# Patient Record
Sex: Male | Born: 1940 | Race: Black or African American | Hispanic: No | Marital: Single | Smoking: Current some day smoker
Health system: Southern US, Community
[De-identification: ages and names within clinical notes are randomized; demographics above are authoritative.]

## PROBLEM LIST (undated history)

## (undated) DIAGNOSIS — M549 Dorsalgia, unspecified: Secondary | ICD-10-CM

## (undated) DIAGNOSIS — Z8546 Personal history of malignant neoplasm of prostate: Secondary | ICD-10-CM

## (undated) DIAGNOSIS — I10 Essential (primary) hypertension: Secondary | ICD-10-CM

## (undated) DIAGNOSIS — K219 Gastro-esophageal reflux disease without esophagitis: Secondary | ICD-10-CM

## (undated) DIAGNOSIS — E7800 Pure hypercholesterolemia, unspecified: Secondary | ICD-10-CM

## (undated) DIAGNOSIS — F329 Major depressive disorder, single episode, unspecified: Secondary | ICD-10-CM

## (undated) DIAGNOSIS — J449 Chronic obstructive pulmonary disease, unspecified: Secondary | ICD-10-CM

## (undated) DIAGNOSIS — C801 Malignant (primary) neoplasm, unspecified: Secondary | ICD-10-CM

## (undated) DIAGNOSIS — I517 Cardiomegaly: Secondary | ICD-10-CM

## (undated) DIAGNOSIS — E041 Nontoxic single thyroid nodule: Secondary | ICD-10-CM

## (undated) DIAGNOSIS — Q619 Cystic kidney disease, unspecified: Secondary | ICD-10-CM

## (undated) DIAGNOSIS — F32A Depression, unspecified: Secondary | ICD-10-CM

## (undated) SURGERY — INSERTION, CARDIAC PACEMAKER
Anesthesia: Moderate Sedation | Laterality: Left

---

## 1969-05-25 HISTORY — PX: APPENDECTOMY: SHX54

## 2006-08-01 ENCOUNTER — Emergency Department: Payer: Self-pay | Admitting: Emergency Medicine

## 2006-12-24 ENCOUNTER — Emergency Department: Payer: Self-pay | Admitting: Emergency Medicine

## 2006-12-25 ENCOUNTER — Other Ambulatory Visit: Payer: Self-pay

## 2007-02-22 ENCOUNTER — Ambulatory Visit: Payer: Self-pay | Admitting: Internal Medicine

## 2007-04-06 ENCOUNTER — Other Ambulatory Visit: Payer: Self-pay

## 2007-04-06 ENCOUNTER — Emergency Department: Payer: Self-pay | Admitting: Internal Medicine

## 2007-05-05 ENCOUNTER — Ambulatory Visit: Payer: Self-pay | Admitting: Urology

## 2007-05-09 ENCOUNTER — Ambulatory Visit: Payer: Self-pay | Admitting: Urology

## 2007-05-17 ENCOUNTER — Ambulatory Visit: Payer: Self-pay | Admitting: Radiation Oncology

## 2007-05-26 ENCOUNTER — Ambulatory Visit: Payer: Self-pay | Admitting: Radiation Oncology

## 2007-06-08 ENCOUNTER — Ambulatory Visit: Payer: Self-pay | Admitting: Radiation Oncology

## 2007-06-26 ENCOUNTER — Ambulatory Visit: Payer: Self-pay | Admitting: Radiation Oncology

## 2007-07-24 ENCOUNTER — Ambulatory Visit: Payer: Self-pay | Admitting: Radiation Oncology

## 2007-08-24 ENCOUNTER — Ambulatory Visit: Payer: Self-pay | Admitting: Radiation Oncology

## 2007-09-23 ENCOUNTER — Ambulatory Visit: Payer: Self-pay | Admitting: Radiation Oncology

## 2007-11-23 ENCOUNTER — Ambulatory Visit: Payer: Self-pay | Admitting: Radiation Oncology

## 2007-12-24 ENCOUNTER — Ambulatory Visit: Payer: Self-pay | Admitting: Radiation Oncology

## 2008-07-06 ENCOUNTER — Observation Stay: Payer: Self-pay | Admitting: Internal Medicine

## 2008-08-19 ENCOUNTER — Emergency Department: Payer: Self-pay | Admitting: Emergency Medicine

## 2009-01-24 ENCOUNTER — Inpatient Hospital Stay: Payer: Self-pay | Admitting: Internal Medicine

## 2009-08-30 IMAGING — CR DG CHEST 2V
1 series · 2 of 2 positions shown · non-contrast
Comparison: none

REASON FOR EXAM: WEIGHT LOSS, SMOKER
COMMENTS:

[Series 1: view not recorded · 0.17mm/px · 2 of 2 slices shown]
[im 1/2]
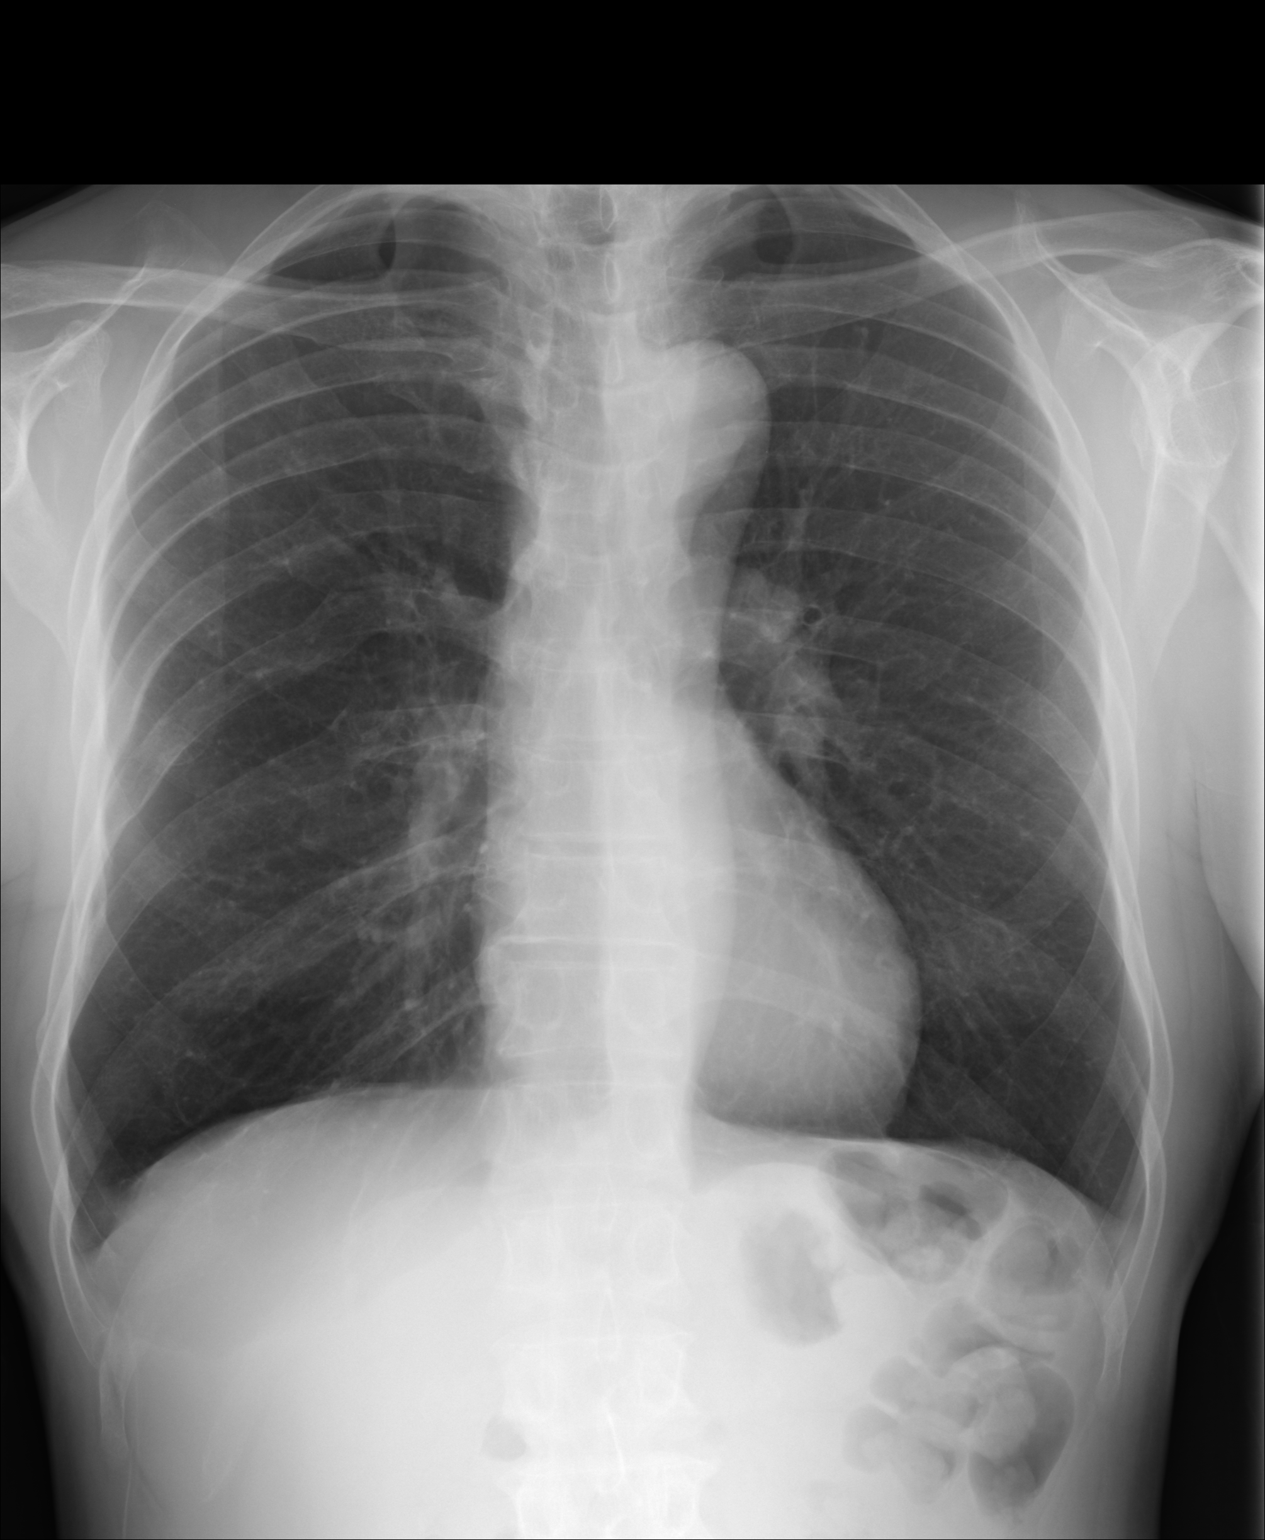
[im 2/2]
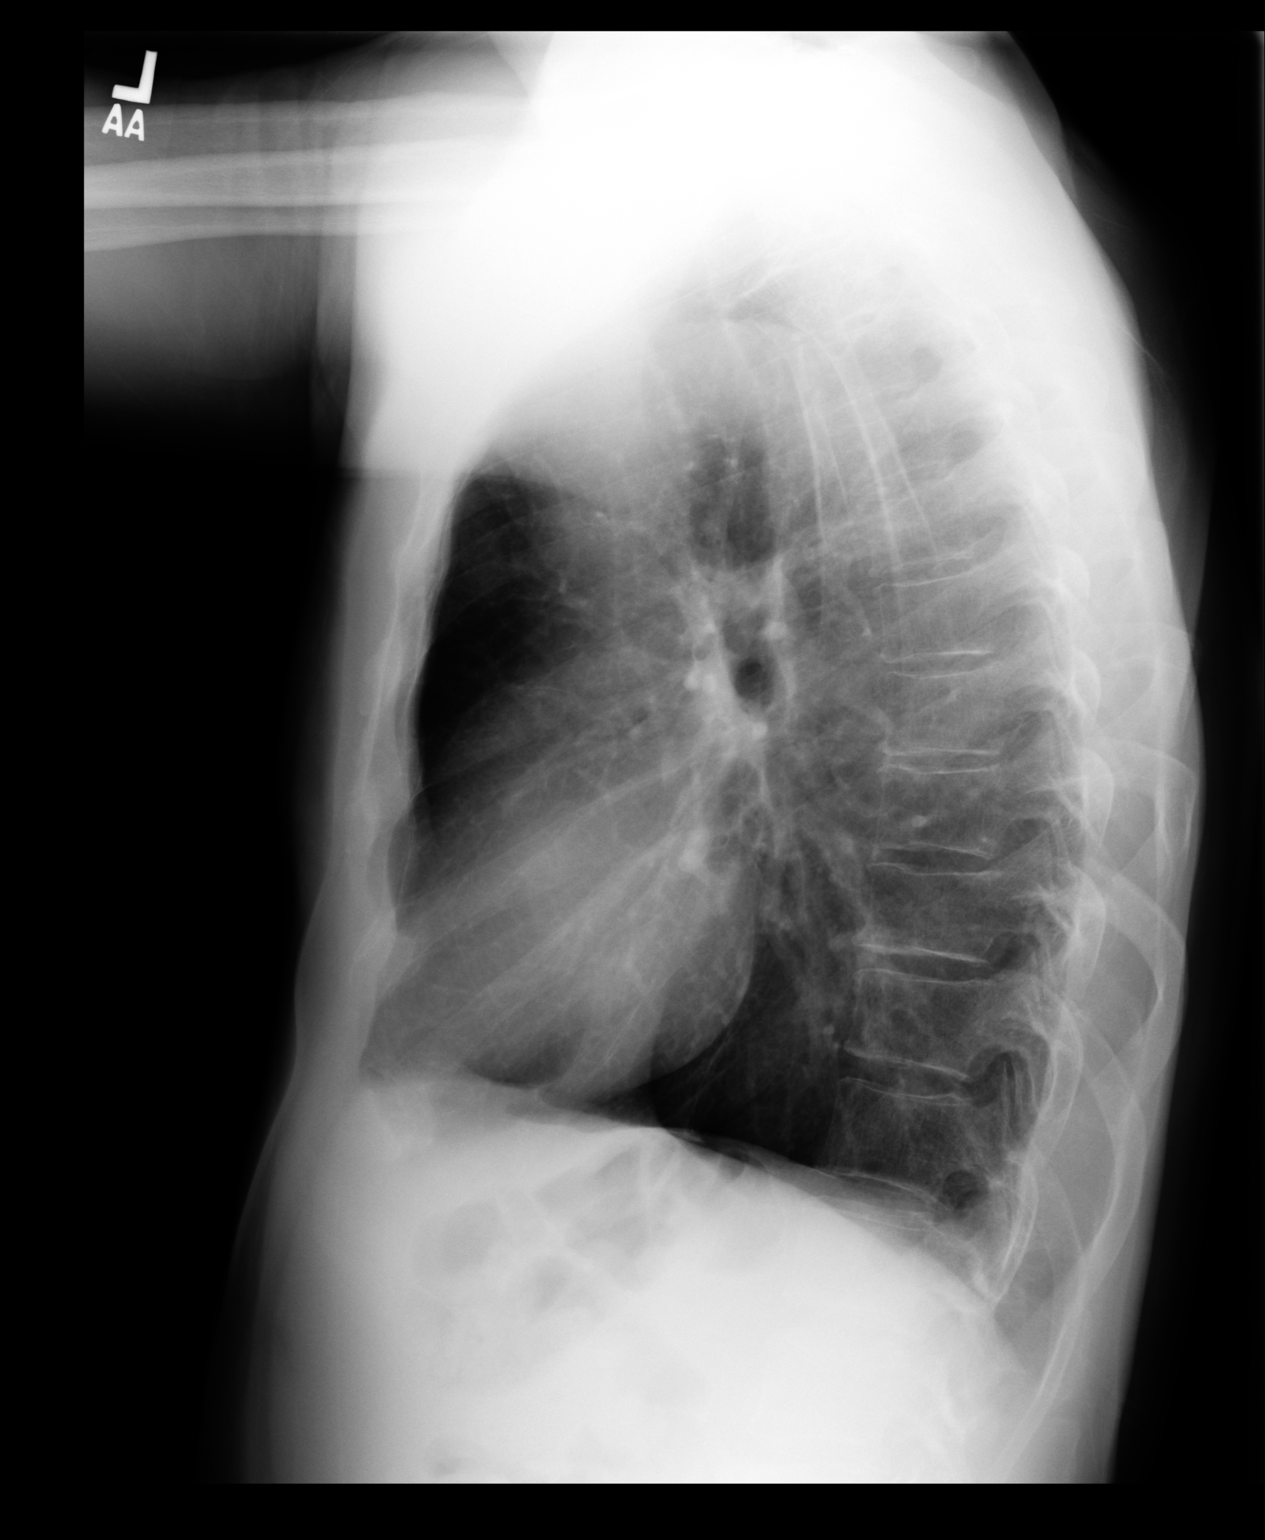

[2 of 2 positions shown; findings below may reference images not displayed]

PROCEDURE:     DXR - DXR CHEST PA (OR AP) AND LATERAL  - February 22, 2007  [DATE]

RESULT:     The lungs are mildly hyperinflated with hemidiaphragm
flattening. There is no focal infiltrate. The heart is normal in size. There
is tortuosity of the descending thoracic aorta. There is no pleural
effusion.There is mild LEFT hilar prominence.
IMPRESSION: I do not see evidence of pneumonia. There are findings
consistent with COPD. No parenchymal masses are identified. Mild fullness in
the LEFT hilum is likely related to the main LEFT pulmonary artery trunk. CT
scanning would be useful in excluding occult lymphadenopathy.

## 2009-08-30 IMAGING — CR RIGHT FOOT COMPLETE - 3+ VIEW
1 series · 3 of 3 positions shown · non-contrast
Comparison: none

REASON FOR EXAM: PAIN
COMMENTS:

[Series 1: view not recorded · 0.17mm/px · 3 of 3 slices shown]
[im 1/3]
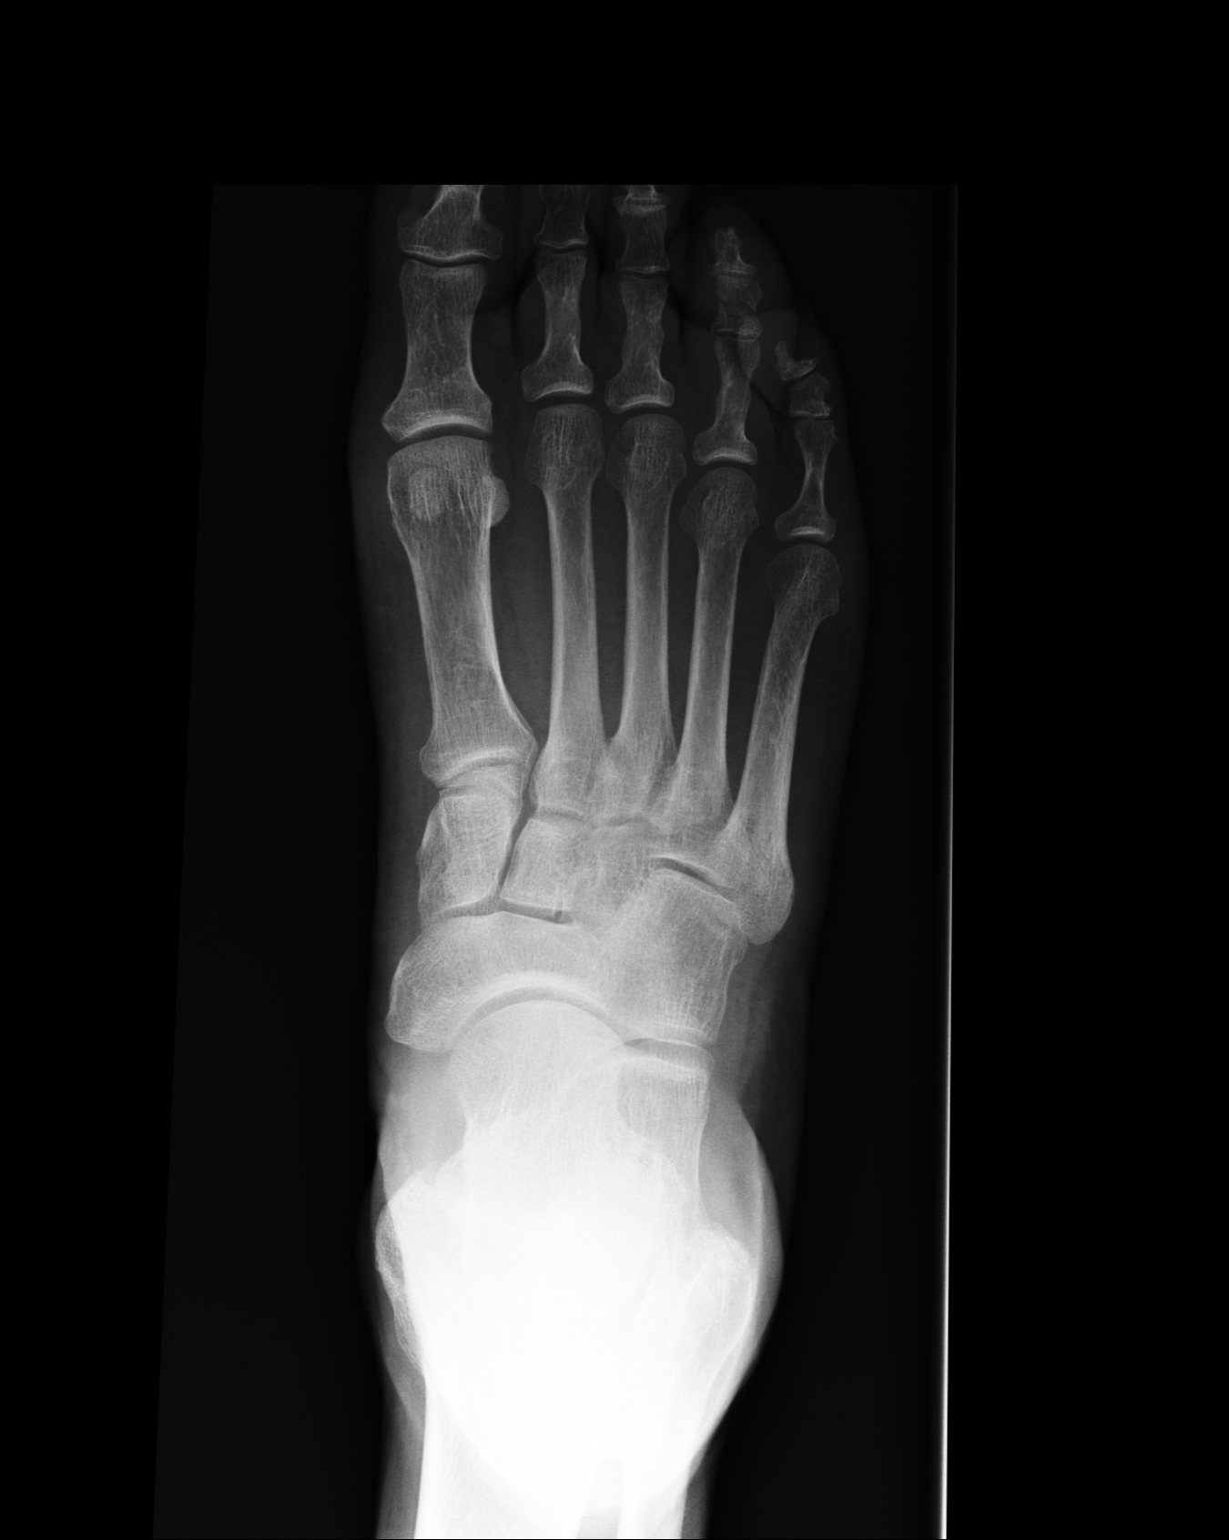
[im 2/3]
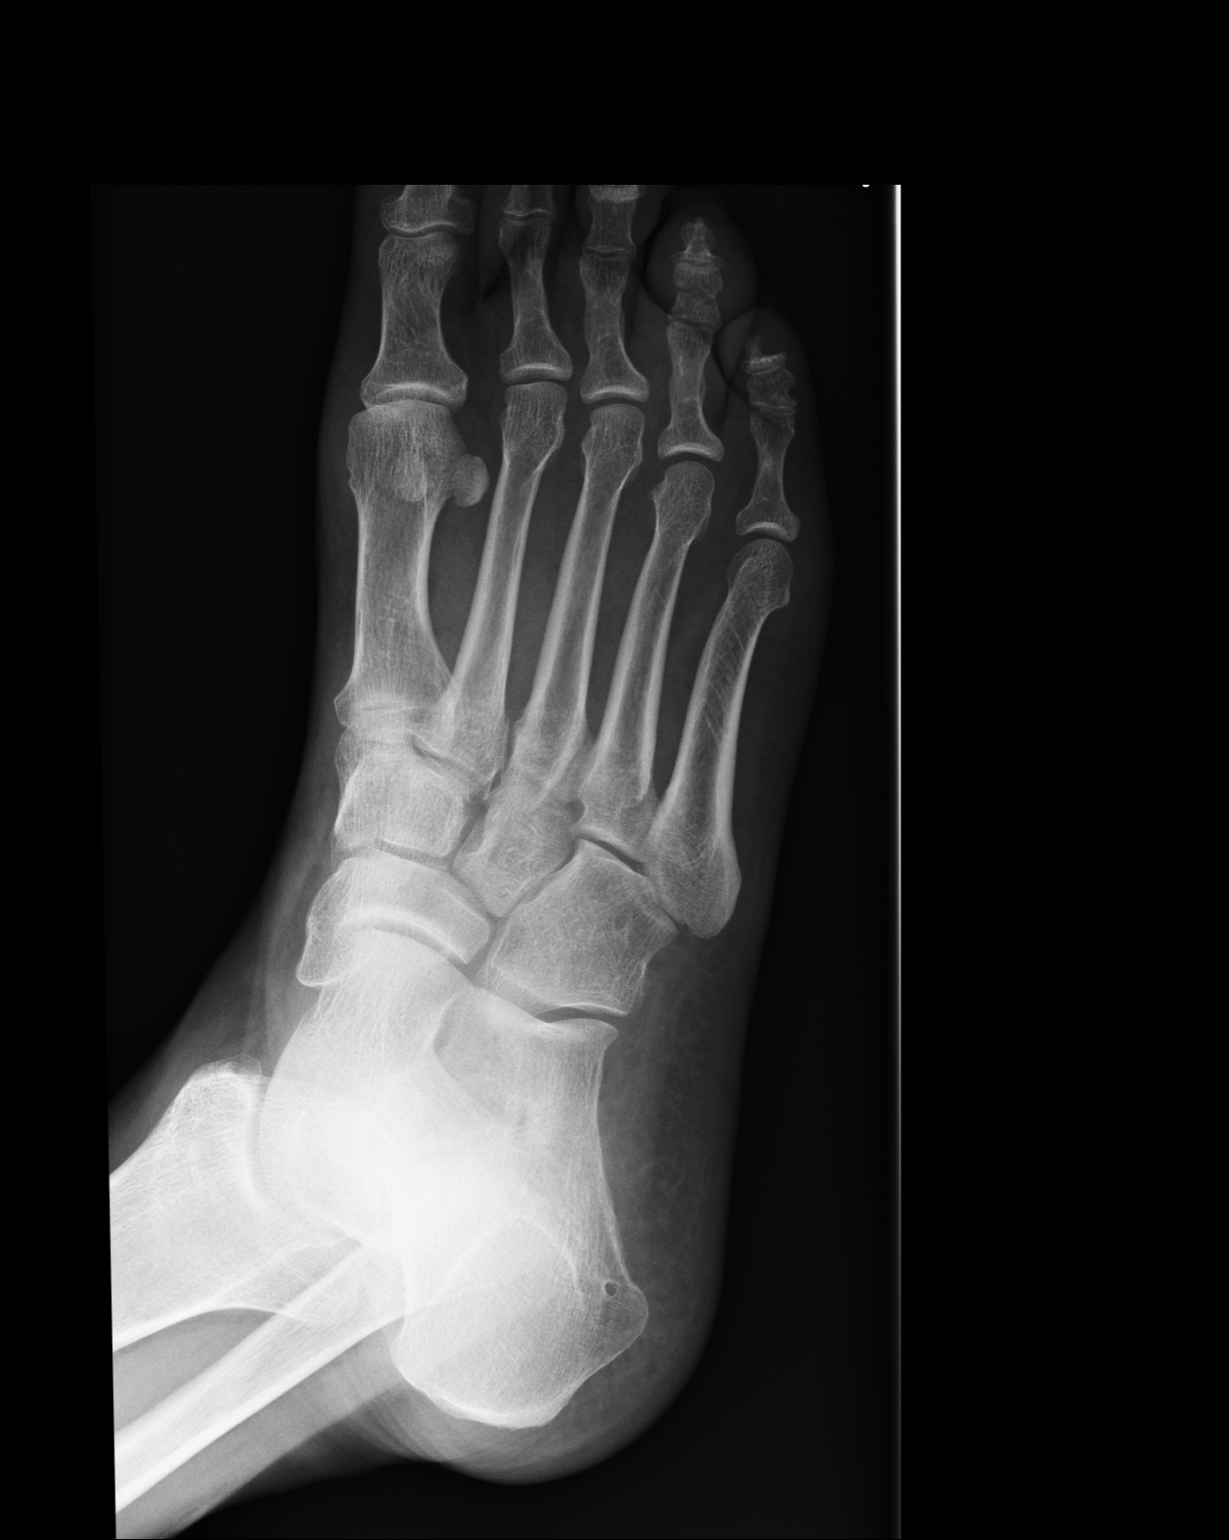
[im 3/3]
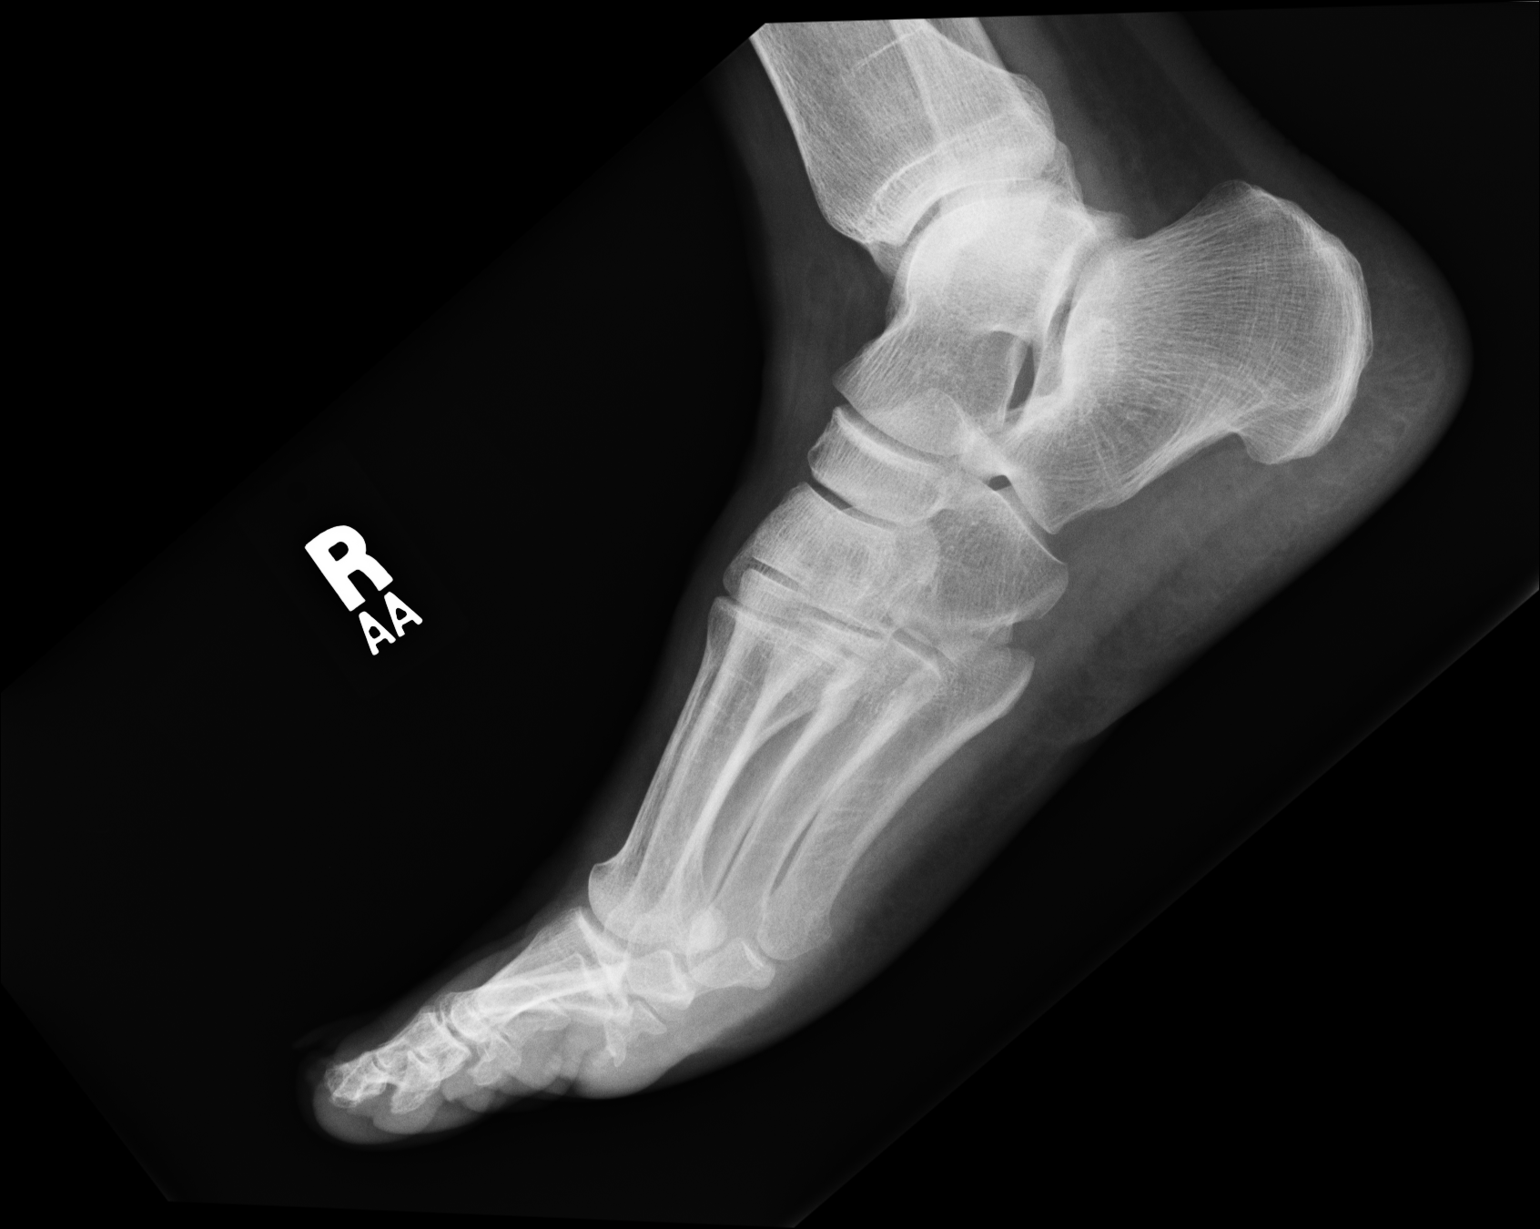

[3 of 3 positions shown; findings below may reference images not displayed]

PROCEDURE:     DXR - DXR FOOT RT COMPLETE W/OBLIQUES  - February 22, 2007  [DATE]

RESULT:     The patient has unexplained bruising over the fourth toe.

There is a fracture through the midshaft of the middle phalanx of the fourth
toe. There is mild distraction of the fracture fragments by 2-3 mm. The
proximal and distal phalanges of the fourth toe appear intact. The bones
otherwise exhibit no acute abnormality.
IMPRESSION: There is a nondisplaced fracture through the midshaft of the middle phalanx
of the RIGHT fourth toe.

## 2009-08-30 IMAGING — RF DG BARIUM SWALLOW
1 series · 15 of 24 positions shown · non-contrast
Comparison: none

REASON FOR EXAM: Dysphagia
COMMENTS:

[Series 1: run · 7 acquisitions, 15 frames shown]
[im 1/7]
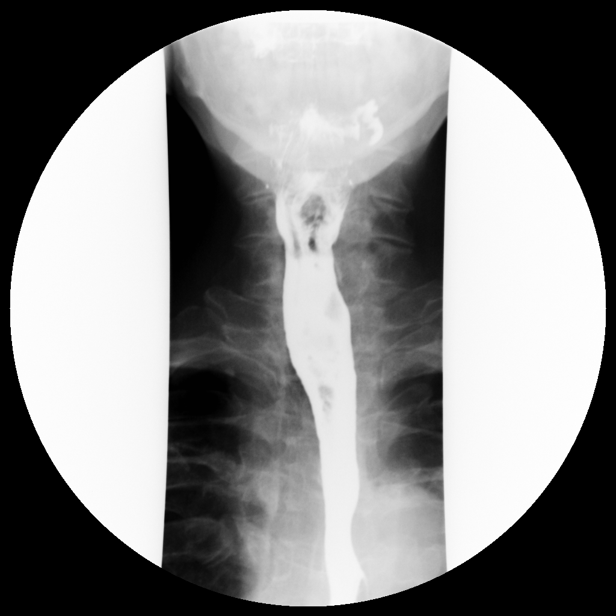
[im 1/7]
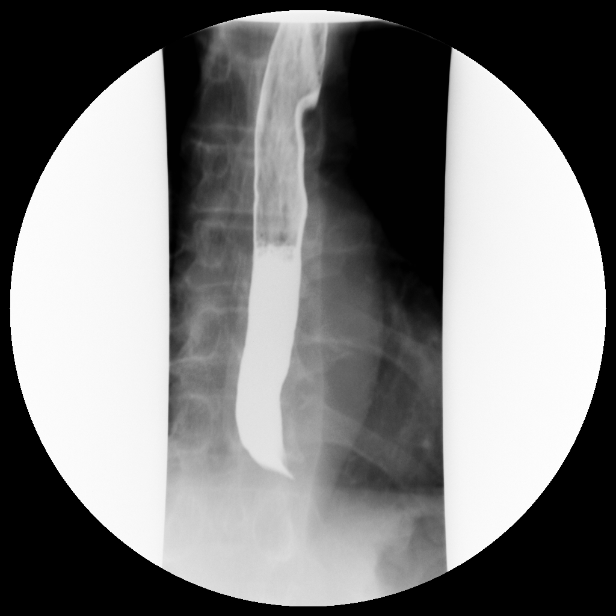
[im 2/7]
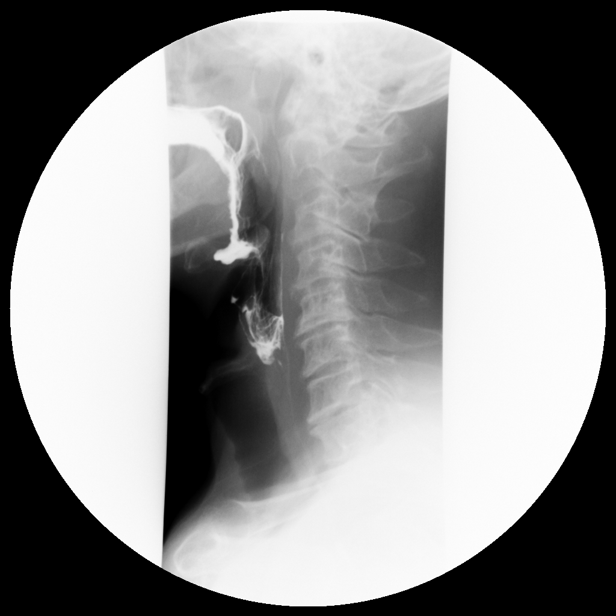
[im 2/7]
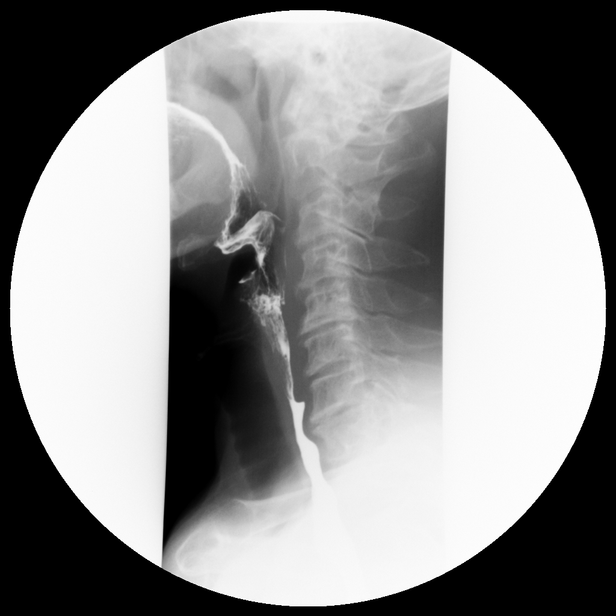
[im 2/7]
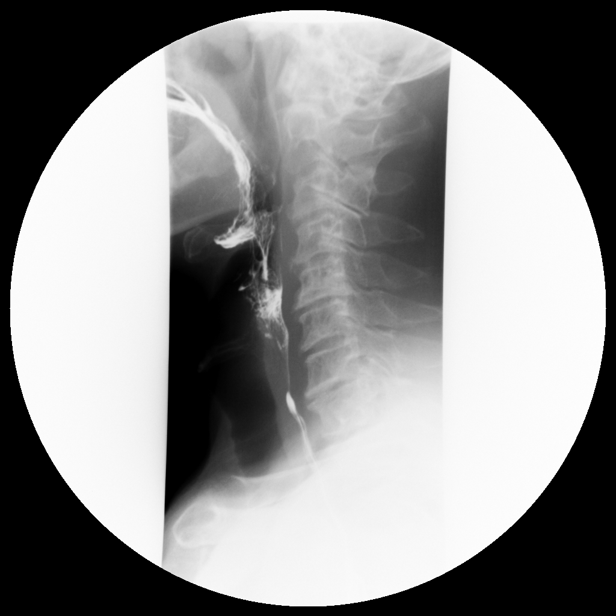
[im 3/7]
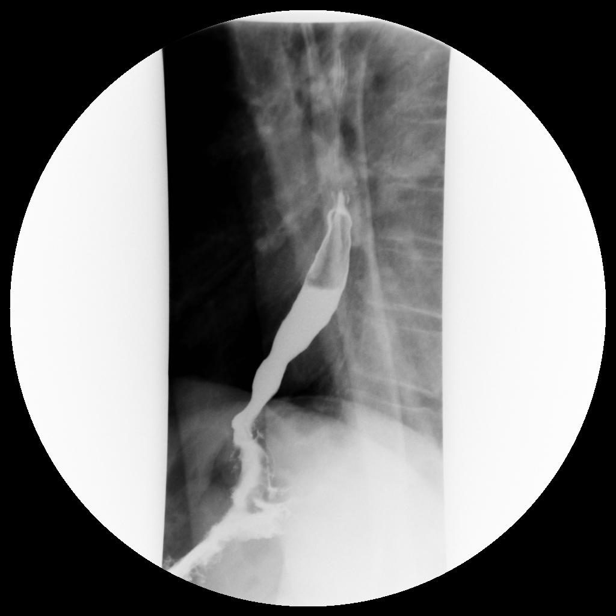
[im 3/7]
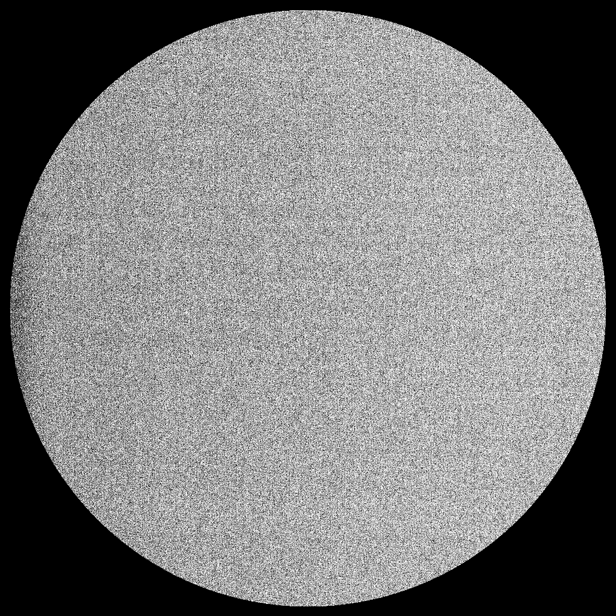
[im 4/7]
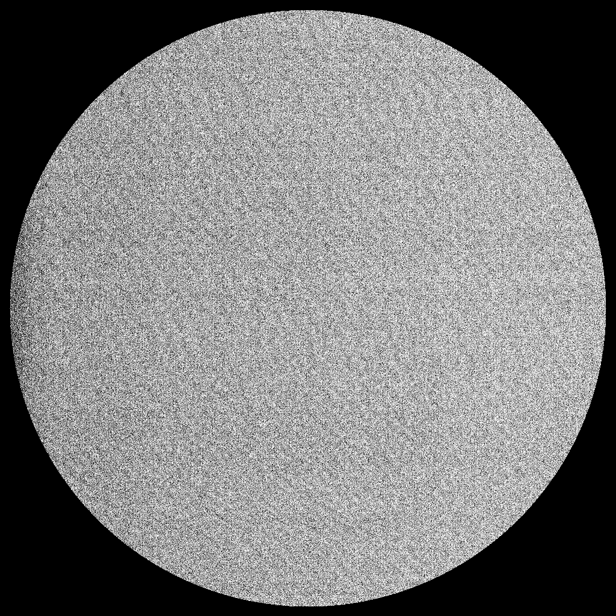
[im 5/7]
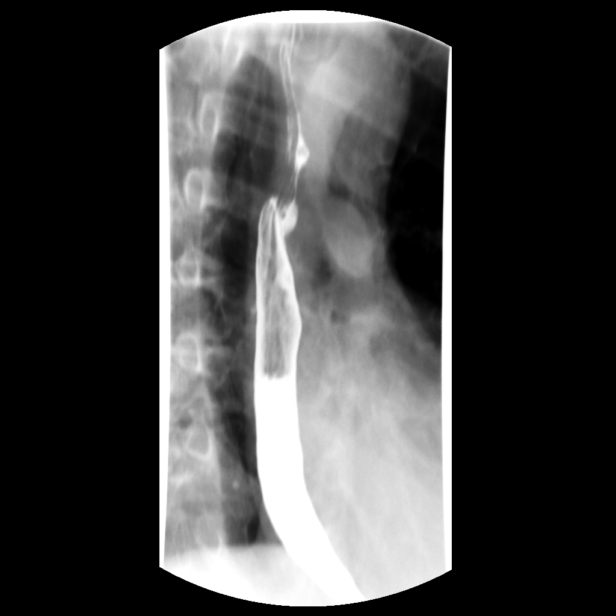
[im 5/7]
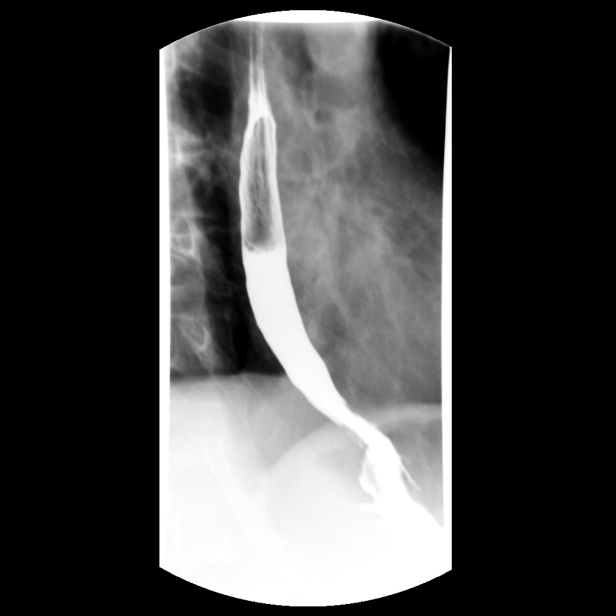
[im 5/7]
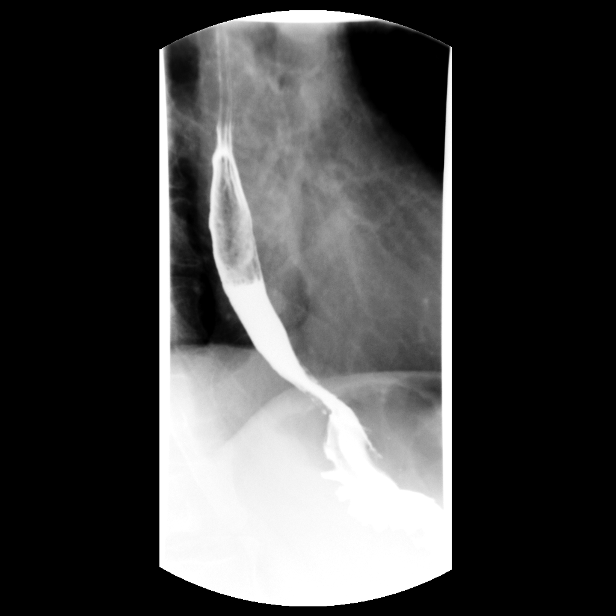
[im 6/7]
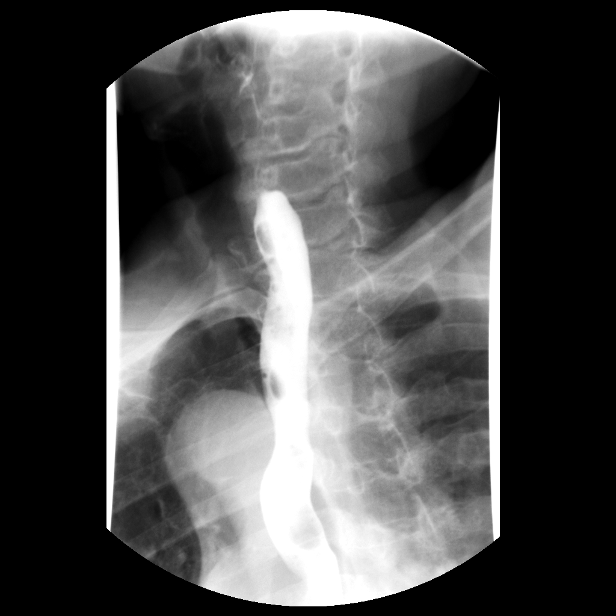
[im 7/7]
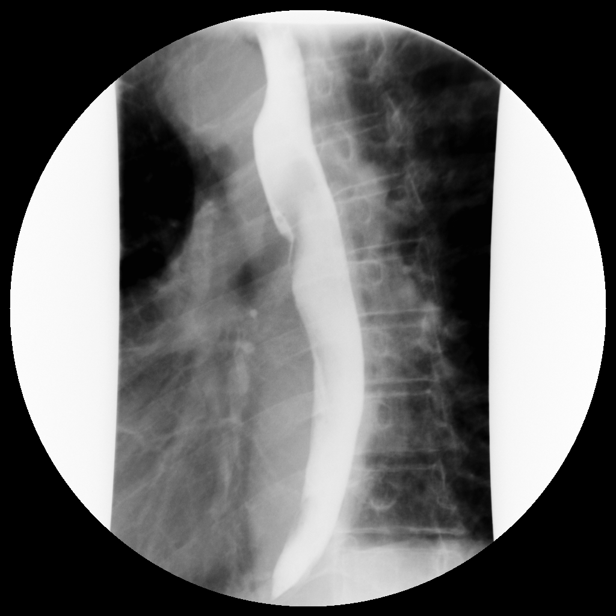
[im 7/7]
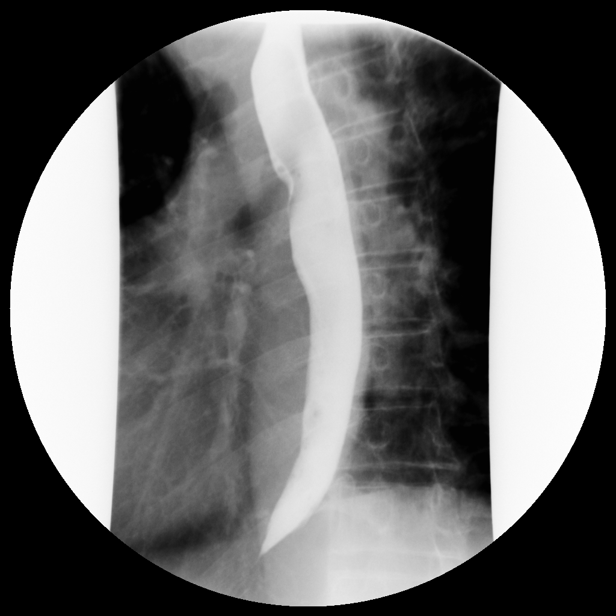
[im 7/7]
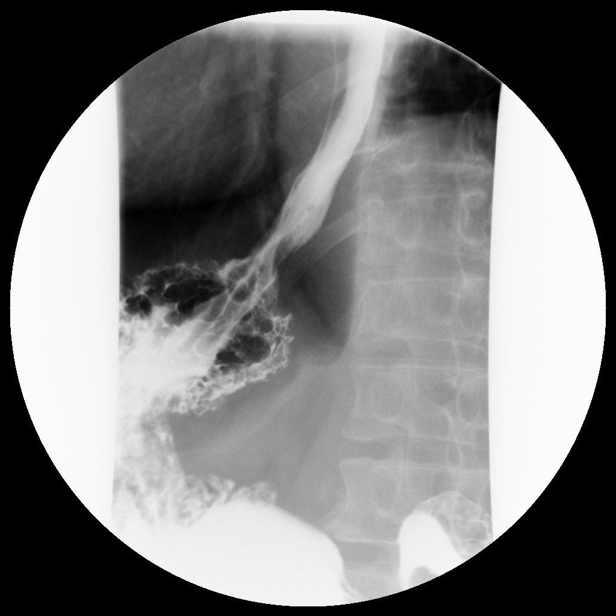

[15 of 24 positions shown; findings below may reference images not displayed]

PROCEDURE:     FL  - FL BARIUM SWALLOW  - February 22, 2007 [DATE]

RESULT:     The patient easily ingested the liquid barium. The mucosa of the
esophagus is normal. There is no evidence of stenosis. There is no
aspiration or penetration of barium into the upper airway. A 12.5 mm barium
impregnated tablet passed easily through the esophagus into the stomach. No
gastroesophageal reflux could be elicited.
IMPRESSION: Unremarkable barium swallow examination. No evidence of
stenosis. No gastroesophageal reflux. No hiatal hernia or mucosal
abnormality evident.

## 2010-02-12 ENCOUNTER — Emergency Department: Payer: Self-pay | Admitting: Emergency Medicine

## 2010-07-16 ENCOUNTER — Ambulatory Visit: Payer: Self-pay | Admitting: Cardiovascular Disease

## 2011-01-11 IMAGING — CR DG CHEST 1V PORT
1 series · 1 of 1 positions shown · non-contrast
Comparison: none

REASON FOR EXAM: cp
COMMENTS:

PROCEDURE:     DXR - DXR PORTABLE CHEST SINGLE VIEW  - July 05, 2008 [DATE]
RESULT:     Comparison: 04/06/2007

[view not recorded]
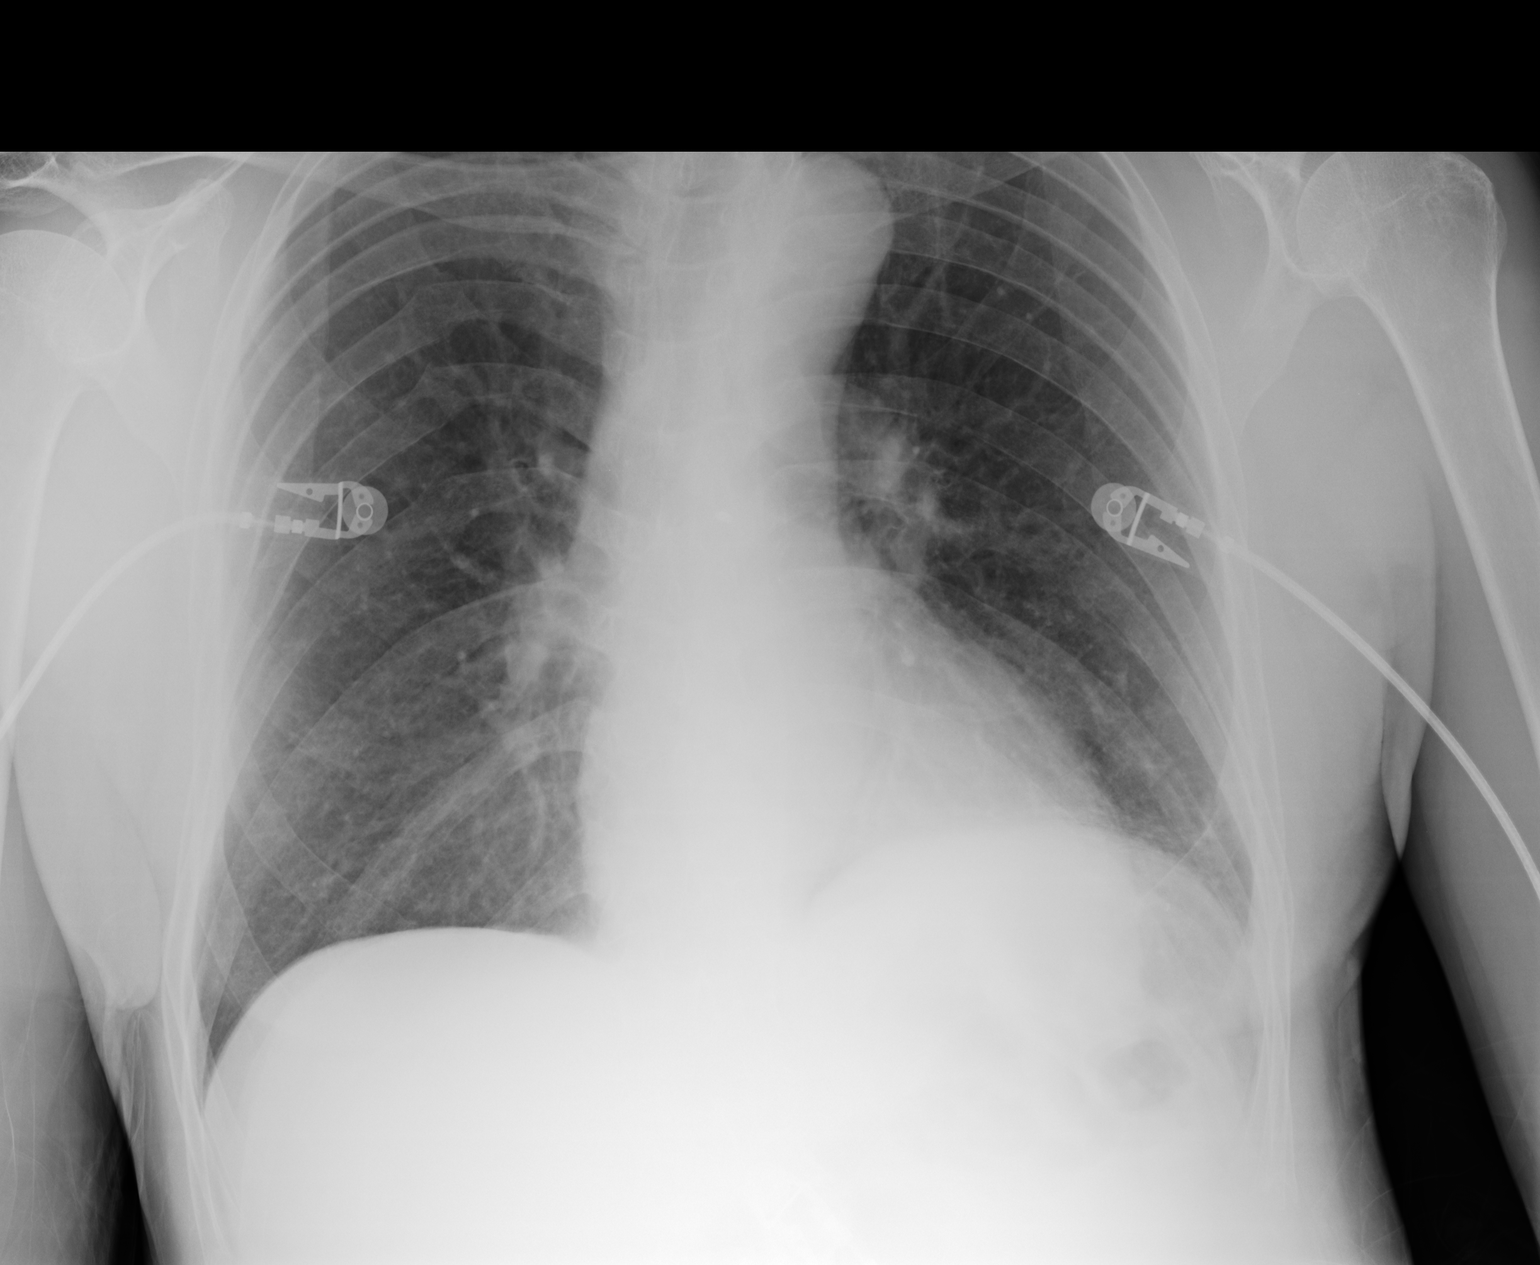

[1 of 1 positions shown; findings below may reference images not displayed]

FINDINGS: Single portable AP chest radiograph is provided. There is bilateral
interstitial prominence. There is no focal parenchymal opacity, pleural
effusion, or pneumothorax. Normal cardiomediastinal silhouette. The osseous
structures are unremarkable. There is evidence of prior posterior right rib
fractures, unchanged.
IMPRESSION: No acute disease of the chest.

## 2011-08-08 LAB — CBC
HGB: 16.9 g/dL (ref 13.0–18.0)
MCH: 31.8 pg (ref 26.0–34.0)
MCHC: 33.6 g/dL (ref 32.0–36.0)
Platelet: 223 10*3/uL (ref 150–440)
WBC: 7.1 10*3/uL (ref 3.8–10.6)

## 2011-08-08 LAB — COMPREHENSIVE METABOLIC PANEL
Albumin: 4.1 g/dL (ref 3.4–5.0)
Alkaline Phosphatase: 61 U/L (ref 50–136)
Anion Gap: 12 (ref 7–16)
Calcium, Total: 10.3 mg/dL — ABNORMAL HIGH (ref 8.5–10.1)
Chloride: 95 mmol/L — ABNORMAL LOW (ref 98–107)
Co2: 28 mmol/L (ref 21–32)
EGFR (African American): >60
EGFR (Non-African Amer.): >60
Glucose: 125 mg/dL — ABNORMAL HIGH (ref 65–99)
Osmolality: 273 (ref 275–301)
SGOT(AST): 28 U/L (ref 15–37)
SGPT (ALT): 20 U/L
Sodium: 135 mmol/L — ABNORMAL LOW (ref 136–145)

## 2011-08-08 LAB — LIPASE, BLOOD: Lipase: 347 U/L (ref 73–393)

## 2011-08-08 LAB — CK TOTAL AND CKMB (NOT AT ARMC): CK, Total: 205 U/L (ref 35–232)

## 2011-08-08 LAB — TROPONIN I: Troponin-I: <0.02 ng/mL

## 2011-08-09 ENCOUNTER — Observation Stay: Payer: Self-pay | Admitting: *Deleted

## 2011-08-09 LAB — URINALYSIS, COMPLETE
Bilirubin,UR: NEGATIVE
Blood: NEGATIVE
Ketone: NEGATIVE
Ph: 5 (ref 4.5–8.0)
Specific Gravity: 1.015 (ref 1.003–1.030)
Squamous Epithelial: <1
WBC UR: 8 /HPF (ref 0–5)

## 2011-08-09 LAB — TROPONIN I
Troponin-I: <0.02 ng/mL
Troponin-I: <0.02 ng/mL

## 2011-08-09 LAB — CK TOTAL AND CKMB (NOT AT ARMC)
CK-MB: 3.2 ng/mL (ref 0.5–3.6)
CK-MB: 2.7 ng/mL (ref 0.5–3.6)

## 2011-08-09 LAB — AMYLASE: Amylase: 131 U/L — ABNORMAL HIGH (ref 25–115)

## 2011-08-09 LAB — LIPID PANEL: Ldl Cholesterol, Calc: 134 mg/dL — ABNORMAL HIGH (ref 0–100)

## 2011-08-09 LAB — LIPASE, BLOOD: Lipase: 506 U/L — ABNORMAL HIGH (ref 73–393)

## 2011-08-10 LAB — CBC WITH DIFFERENTIAL/PLATELET
Basophil #: 0 10*3/uL (ref 0.0–0.1)
Basophil %: 0.6 %
Eosinophil #: 0.1 10*3/uL (ref 0.0–0.7)
HGB: 14.8 g/dL (ref 13.0–18.0)
Lymphocyte #: 1.8 10*3/uL (ref 1.0–3.6)
Lymphocyte %: 40.3 %
MCH: 31.4 pg (ref 26.0–34.0)
MCHC: 33.4 g/dL (ref 32.0–36.0)
MCV: 94 fL (ref 80–100)
Monocyte #: 0.5 10*3/uL (ref 0.0–0.7)
Neutrophil #: 2 10*3/uL (ref 1.4–6.5)
Neutrophil %: 44.3 %
RDW: 13.3 % (ref 11.5–14.5)

## 2011-08-10 LAB — COMPREHENSIVE METABOLIC PANEL
Albumin: 3.4 g/dL (ref 3.4–5.0)
Alkaline Phosphatase: 49 U/L — ABNORMAL LOW (ref 50–136)
Calcium, Total: 8.7 mg/dL (ref 8.5–10.1)
EGFR (African American): >60
SGOT(AST): 16 U/L (ref 15–37)
SGPT (ALT): 18 U/L
Sodium: 136 mmol/L (ref 136–145)

## 2011-08-10 LAB — BILIRUBIN, DIRECT: Bilirubin, Direct: 0.3 mg/dL — ABNORMAL HIGH (ref 0.00–0.20)

## 2014-09-16 NOTE — Consult Note (Signed)
Consult dictated, 21 WM came with chest pain and abdominal pain but had cardiac cath which showed mild CAD, treated medically. He is having epigastric and RUQ tenderness , consider GI/Surgical consult.  Electronic Signatures: Angelica Ran (MD)  (Signed on 17-Mar-13 10:30)  Authored  Last Updated: 17-Mar-13 10:30 by Angelica Ran (MD)

## 2014-09-16 NOTE — Discharge Summary (Signed)
PATIENT NAME:  James Dickerson, James Dickerson MR#:  [id number] DATE OF BIRTH:  Oct 22, 1940  DATE OF ADMISSION:  08/09/2011 DATE OF DISCHARGE:  08/10/2011  DISCHARGE DIAGNOSES:  1. Abdominal pain, may be secondary to esophagitis and gastritis.  2. Chest pain, myocardial infarction ruled out.  3. History of mild coronary artery disease, may be related to gastritis. 4. Hypertension. 5. Hyperlipidemia.  6. Uncomplicated urinary tract infection.   CONSULTS:  Cardiology Dr. Neoma Laming.   HOSPITAL COURSE: This is a 74 year old male with history of hypertension, hyperlipidemia, history of prostate cancer, gastroesophageal reflux disease. He presented with some atypical chest pain and abdominal pain. saying he has been worse over the last three weeks. He also had some mild nausea and vomiting. He was admitted to observation telemetry. His EKG when he came in no acute ischemic changes, sinus rhythm. He had a cardiac catheterization done in December 2012 which were mild coronary artery disease, ejection fraction of 60%, mid LAD 40% disease,  the mid circumflex 50%  and mid RCA, 40% stenosis. He also had a CT of the abdomen and pelvis done at admission which showed that the patient had bilateral emphysematous changes. He had mild prominence of the gastric rugal folds and mild distal esophageal wall thickening can be seen in the esophagitis and gastritis. His chest x-ray was negative. Cardiology was consulted. Dr. Neoma Laming saw him and thinks it looks like noncardiac and to be treated medically. He is already on aspirin and statin. The patient will follow up with cardiology as an outpatient. His abdominal pain is significantly resolved.  Right now he is mainly complaining of mild suprapubic pain. He has mild dysuria also. Urinalysis showed 1+ leukocyte esterase and some pyuria. I am going to give him some Cipro for a few days because of uncomplicated urinary tract infection, I will give him a referral for outpatient  gastrointestinal work-up. He may need endoscopy as outpatient. He was not taking Nexium every day, advised to take Nexium 40 mg daily.   DISCHARGE MEDICATIONS:  1. Nexium 40 mg daily.  2. Ranitidine 150 mg b.i.d. 3. simvastatin 40 mg daily.   New medications:  1. Ciprofloxacin 250 mg p.o. b.i.d. for five days. 2.  Take aspirin 81 mg daily.   DIET: Advised low sodium, low cholesterol diet.   CONDITION AT DISCHARGE:  He is comfortable. He is walking around. T-max 97.8, heart rate 83, respiratory rate 20, blood pressure 121/85, saturating 96% on room air. Chest is clear. Heart sounds are regular. Abdomen soft, nontender. No epigastric tenderness. Murphy sign  is negative. He  had slight elevated bilirubin but that is  indirect unconjugated . Direct bilirubin is normal at 0.3. His ALT and AST alkaline phosphatase and bilirubin normal, CPK and troponins are negative. His  lipase only minimally elevated  but he does not have any epigastric pain. When he came in, his calcium was high in the range of 10.3 but that improved with hydration. His calcium today is 8.7. The patient should follow up with Dr. Brunetta Genera in 1 to 2 weeks. Follow up LFT. Follow up with Dr Dionne Milo GI in two weeks. Follow up with Dr. Neoma Laming cardiology in 2 to 3 weeks.  Time Spent on discharge:  40 minutes.   ____________________________ Mena Pauls, MD ag:ljs D: 08/10/2011 14:58:48 ET T: 08/11/2011 11:46:23 ET JOB#: [id number]  cc: Mena Pauls, MD, <Dictator> Meindert A. Brunetta Genera, MD Dionisio David, MD Mena Pauls MD ELECTRONICALLY SIGNED 08/18/2011 15:26

## 2014-09-16 NOTE — H&P (Signed)
PATIENT NAME:  James Dickerson, VERGA MR#:  [id number] DATE OF BIRTH:  [birth year]  DATE OF ADMISSION:  08/09/2011  ED REFERRING PHYSICIAN: Beaulah Dinning, MD  PRIMARY CARE PHYSICIAN: Lorelee Market, MD  INDICATION FOR ADMISSION: Chest pain/stomach pain.  CHIEF COMPLAINT: "I've been having intense chest pain and stomach pain, worse over the last three weeks, worse today."    HISTORY OF PRESENT ILLNESS: The patient is a 74 year old African American male with past medical history of prostate cancer status post radiation therapy, acid reflux, smoking history, and coronary artery disease presenting with chest pain, 8/10 in intensity, described as wedge-shaped sharp pain on the left side of his chest without any radiation lasting today and associated with some nausea and mild vomiting and stomach pain also. He stated over the past three weeks he has been having a dull kind of stomach pain and intermittent chest pain that has been coming and going, but today it stayed the whole time, again 8/10 in intensity, at which point he decided to come to the ED for further workup and evaluation. In the ED, he was noted to have first set of cardiac enzymes that were noted, but his lipase is elevated to 347. He did have a CT of the chest and abdomen done. The abdomen showed some possible gallbladder wall sludge and he is being admitted for further workup and evaluation.    PAST MEDICAL HISTORY:  1. Prostate cancer status post radiation treatment. 2. Acid reflux. 3. Smoking, quit two weeks ago, per patient.  4. Hypertension. 5. Hyperlipidemia. 6. Coronary artery disease status post cardiac catheterization.   PAST SURGICAL HISTORY: Appendectomy.  DRUG ALLERGIES: No known drug allergies.   MEDICATIONS: The patient is currently not taking any medications.  SOCIAL HISTORY: He has smoked a pack per day for the past 55 years. No alcohol or drug use. He is a retired Engineer, agricultural.   FAMILY HISTORY: Mother died at 2 of  a MI. Father died in his 91s of a MI.  Siblings with unknown health history.   REVIEW OF SYSTEMS: CONSTITUTIONAL: Questionable fatigue, chest pain, and stomach pain. Weight loss of 5 pounds over the last 3 weeks and poor appetite. EYES: No blurred of double vision. No redness.  ENT: Denies ear pain, hearing loss, or seasonal allergies. RESPIRATORY: No cough, wheeze, hemoptysis, dyspnea, asthma, painful respirations, or COPD. CARDIOVASCULAR: Positive chest pain. No orthopnea, edema, arrhythmia, or dyspnea on exertion. Positive high blood pressure. GI: Positive nausea and vomiting. Positive for pain in the abdominal area. No hematemesis, melena, or ulcers. Positive GERD. GU: No dysuria or hematuria or renal calculi. ENDOCRINE: No polyuria, nocturia, or thyroid problems. HEME/LYMPH: No anemia, easy bruising, bleeding, or swollen glands. INTEGUMENT: No acne, rash, lesions, or change in mole, hair or skin. NEURO: No numbness, weakness, dysarthria, epilepsy, tremor, or vertigo.  PSYCH: No anxiety, insomnia, ADD, seizure, bipolar, or depression.   PHYSICAL EXAMINATION:  VITALS:  In the ED, blood pressure is 153/99, temperature 98.6, pulse 93, respirations 16, pulse oximetry 99% on room air.  GENERAL APPEARANCE: Well-developed, well-nourished male lying in bed in no acute respiratory distress.  HEENT: Pupils are equally round and reactive to light and accommodation. Extraocular movements are intact. No scleral icterus. No conjunctivitis. No difficulty hearing. Tympanic membranes intact.   NECK: No thyroid enlargement or nodules. Neck is supple and nontender. No adenopathy or tenderness to palpation.   RESPIRATORY: No rales, rhonchi, crackles, diminished breath sounds, or labored breathing.  CARDIOVASCULAR: Regular rate and rhythm. No  murmurs. S1 and S2 auscultated. No reproducible chest pain. No S3. No S4. Good pedal pulses are noted in the lower extremities.   ABDOMEN: Soft, nontender, and nondistended.  Positive bowel sounds. No rebound tenderness. No guarding is noted in the epigastric region or in the lower quadrants.   MUSCULOSKELETAL: 5/5 strength in bilateral upper and lower extremities.   SKIN: No rash, lesions, erythema, nodules, or induration. Skin is warm and dry.   LYMPH: No adenopathy noted in the cervical, axillary, or supraclavicular regions.   NEURO: Cranial nerves II through XII grossly intact. Reflexes are intact. Sensory is intact.   PSYCH: Alert and oriented to time, person, and place. Cooperative. Good judgment is noted.   LABS/STUDIES: Lipase 347. Sodium 135, potassium 4.4, chloride 95, bicarbonate 28, BUN 16, creatinine 1.0, glucose 125. Total bilirubin 1.7, AST 28, ALT 20. Troponins less than 0.02. CK-MB is 3.5. Total CK is 205. White cell count 7.1, hemoglobin 16.9, hematocrit 50.5, and platelet count 223.   EKG: Normal sinus rhythm. No acute ST-T wave changes. Questionable left anterior vesicular block. Rate of about 80.   Cardiac catheterization performed in February 2012 showed coronary circulation is right dominant. Mid LAD had 40% stenosis. Mid circumflex had a 50% stenosis. In the mid RCA there was a 40% stenosis. The global left ventricular function was normal with calculated ejection fraction around 60%.  CT of the abdomen, chest and pelvis, while in the ED: Findings of CT of the abdomen suggest gallbladder sludge, biliary ductal dilation, and a small area of local fatty infiltration within the left lower lobe of the adjacent liver. There was an approximately 11 mm indeterminant left adrenal nodule. Spleen, pancreas, and adrenal glands are normal. Chest CT showed emphysema, bilateral atelectasis, and scarring. No focal pulmonary consolidation. There are a very few tiny right lung pulmonary nodular densities. On the report, the densities were not measured due to the size of them being so small.   ASSESSMENT AND PLAN: A 74 year old male with past medical history of  hypertension, prostate cancer status post radiation, hyperlipidemia, tobacco abuse, and coronary artery disease now presenting with chest pain and stomach pain.  1. Chest pain: We will evaluate for acute coronary syndrome, unstable angina. The patient was recently catheterized by Dr. Humphrey Rolls in 2012. Again it showed a stenosis in the mid circumflex and RCA. We will treat with medical management with aspirin, beta blocker, statin, nitro sublingual, p.r.n. morphine, and p.r.n. oxygen. Trend his cardiac enzymes. If his cardiac enzymes are trending upward or if his chest pain continues, he may need to be considered for another catheterization versus stress test. Admit to observation telemetry bed and continue to monitor closely. Continue with IV fluids. 2. Stomach pain: The differentials right now may be some mild fatty infiltration of the liver versus gallbladder wall sludging. He does not show any ductal dilation to suggest any obstruction. His total bilirubin is mildly elevated at 1.6. Again, his CT of the abdomen and pelvis shows possible gallbladder sludging with mild elevation in his total bilirubin. At this time, we will continue to monitor, recheck lipase and amylase in the morning, and continue with supportive care, low fat, low cholesterol, and low salt diet also. If his stomach pain is continuing and his labs are elevating, we will consider gastroenterology consultation.  3. Hypertension: The patient is currently not on any medications. He is not compliant. We will restart metoprolol at 12.5 mg twice a day and titrate up if needed. 4. Hyperlipidemia: Again,  the patient has not been taking any medications so we will restart his simvastatin at 10 mg at bedtime. 5. Elevated hemoglobin and hematocrit: This could be secondary to his smoking history. He did quit two weeks ago. We will continue to monitor his hemoglobin and hematocrit and provide supportive care.  6. Tobacco abuse: The patient was counseled on  tobacco cessation and to continue with cessation. 7. GI and DVT prophylaxis: Continue with PPI and heparin.             CODE STATUS: FULL CODE.   TIME SPENT DICTATING AND EVALUATING PATIENT: 45 minutes.  ____________________________ Vilinda Boehringer, MD vm:slb D: 08/09/2011 04:45:11 ET T: 08/09/2011 10:10:10 ET JOB#: [id number]  cc: Vilinda Boehringer, MD, <Dictator> Meindert A. Brunetta Genera, MD Vilinda Boehringer MD ELECTRONICALLY SIGNED 08/09/2011 22:16

## 2014-09-16 NOTE — Consult Note (Signed)
PATIENT NAME:  James Dickerson, James Dickerson MR#:  [id number] DATE OF BIRTH:  1940-10-23  DATE OF CONSULTATION:  08/09/2011  REFERRING PHYSICIAN:   CONSULTING PHYSICIAN:  Dionisio David, MD  HISTORY: This is a 74 year old white male with a past medical history of prostate cancer status post radiation, hypertension, hyperlipidemia, current smoker, who came in with chest pain and abdominal pain. He mostly has pain in the epigastrium and he has been hurting in the stomach basically and came in for evaluation because of that, but was also complaining of some left-sided chest pain, thus I was asked to evaluate the patient. His past medical history, he just had a cardiac catheterization which showed mild coronary artery disease with 40% mid circumflex and 50% mid RCA with normal ejection fraction. He was treated medically with medications, aspirin, statins, nitrates and beta blockers.   PAST MEDICAL HISTORY:  1. History of hypertension. 2. Hyperlipidemia. 3. History of mild coronary artery disease. 4. History of gastrointestinal reflux.   SOCIAL HISTORY: Continues to smoke. History of EtOH abuse.   FAMILY HISTORY: Positive for coronary artery disease.   PHYSICAL EXAMINATION:    VITAL SIGNS: Stable.   NECK: No JVD.   LUNGS: Clear.   HEART: Regular rate and rhythm. Normal S1, S2. No audible murmur.   ABDOMEN: Slight tenderness in the epigastrium, positive bowel sounds.   EXTREMITIES: No pedal edema.   NEUROLOGIC: The patient appears to be intact.   LABORATORY, RADIOLOGICAL AND DIAGNOSTIC DATA: EKG shows normal sinus rhythm, 81 beats per minute, left anterior fascicular block, no acute changes. Cardiac enzymes x2 are negative.   ASSESSMENT AND PLAN: Atypical chest pain, most likely secondary to acid reflux and gastritis. CT scan was done which showed possible gallbladder sludging with mild elevation of the bilirubin. I do not think any further cardiac work-up is necessary. He may consider getting surgical  or gastroenterology consult for abdominal pain.   Thank you very much for the referral.    ____________________________ Dionisio David, MD sak:ap D: 08/09/2011 10:23:51 ET T: 08/09/2011 11:02:45 ET JOB#: [id number]  cc: Dionisio David, MD, <Dictator> Dionisio David MD ELECTRONICALLY SIGNED 08/18/2011 8:23

## 2015-12-17 ENCOUNTER — Encounter: Payer: Self-pay | Admitting: Emergency Medicine

## 2015-12-17 ENCOUNTER — Emergency Department
Admission: EM | Admit: 2015-12-17 | Discharge: 2015-12-17 | Disposition: A | Discharge status: Home / Self Care | Payer: Medicare Other | Attending: Emergency Medicine | Admitting: Emergency Medicine

## 2015-12-17 DIAGNOSIS — F17200 Nicotine dependence, unspecified, uncomplicated: Secondary | ICD-10-CM | POA: Diagnosis not present

## 2015-12-17 DIAGNOSIS — N390 Urinary tract infection, site not specified: Secondary | ICD-10-CM

## 2015-12-17 DIAGNOSIS — R319 Hematuria, unspecified: Secondary | ICD-10-CM | POA: Diagnosis present

## 2015-12-17 DIAGNOSIS — Z8546 Personal history of malignant neoplasm of prostate: Secondary | ICD-10-CM | POA: Insufficient documentation

## 2015-12-17 HISTORY — DX: Malignant (primary) neoplasm, unspecified: C80.1

## 2015-12-17 HISTORY — DX: Pure hypercholesterolemia, unspecified: E78.00

## 2015-12-17 LAB — CBC WITH DIFFERENTIAL/PLATELET
BASOS ABS: 0 10*3/uL (ref 0–0.1)
BASOS PCT: 1 %
EOS ABS: 0.1 10*3/uL (ref 0–0.7)
EOS PCT: 3 %
HEMATOCRIT: 39.3 % — AB (ref 40.0–52.0)
Hemoglobin: 13.4 g/dL (ref 13.0–18.0)
Lymphocytes Relative: 29 %
Lymphs Abs: 1.4 10*3/uL (ref 1.0–3.6)
MCH: 31.7 pg (ref 26.0–34.0)
MCHC: 34 g/dL (ref 32.0–36.0)
MCV: 93.1 fL (ref 80.0–100.0)
MONO ABS: 0.4 10*3/uL (ref 0.2–1.0)
MONOS PCT: 8 %
NEUTROS ABS: 2.7 10*3/uL (ref 1.4–6.5)
Neutrophils Relative %: 59 %
PLATELETS: 190 10*3/uL (ref 150–440)
RBC: 4.22 MIL/uL — ABNORMAL LOW (ref 4.40–5.90)
RDW: 13.9 % (ref 11.5–14.5)
WBC: 4.6 10*3/uL (ref 3.8–10.6)

## 2015-12-17 LAB — URINALYSIS COMPLETE WITH MICROSCOPIC (ARMC ONLY)
BILIRUBIN URINE: NEGATIVE
Bacteria, UA: NONE SEEN
Glucose, UA: NEGATIVE mg/dL
HGB URINE DIPSTICK: NEGATIVE
KETONES UR: NEGATIVE mg/dL
Nitrite: NEGATIVE
PH: 5 (ref 5.0–8.0)
PROTEIN: NEGATIVE mg/dL
SPECIFIC GRAVITY, URINE: 1.014 (ref 1.005–1.030)

## 2015-12-17 MED ORDER — SULFAMETHOXAZOLE-TRIMETHOPRIM 800-160 MG PO TABS: 1.0000 | ORAL_TABLET | Freq: Two times a day (BID) | ORAL | 0 refills | Status: AC

## 2015-12-17 MED ORDER — SULFAMETHOXAZOLE-TRIMETHOPRIM 800-160 MG PO TABS
1.0000 | ORAL_TABLET | Freq: Once | ORAL | Status: AC
  Administered 2015-12-17: 1 via ORAL
  Filled 2015-12-17: qty 1

## 2015-12-17 NOTE — ED Notes (Signed)
Patient denies pain and is resting comfortably.  

## 2015-12-17 NOTE — ED Triage Notes (Signed)
Pt here with c/o blood in urine since yesterday. Pt reports hx of prostate cancer. Pt denies use of blood thinners, reports he takes occassional aspirin.

## 2015-12-17 NOTE — Discharge Instructions (Signed)

## 2015-12-17 NOTE — ED Provider Notes (Signed)
[facility] Emergency Department Provider Note  ____________________________________________  Time seen: Approximately 1:32 PM  I have reviewed the triage vital signs and the nursing notes.   HISTORY  Chief Complaint Hematuria   HPI James Dickerson is a 75 y.o. male with a history of remote prostate cancer in remission and hyperlipidemia who presents for evaluation of hematuria. Patient reports that 2 days ago he had multiple episodes of hematuria. No blood clots. He reports he had similar episode 8 years ago while undergoing treatment for his prostate cancer and was found to have a UTI. He reports that he has had no hematuria yesterday or today. Today he continues to have some discomfort urinating which prompted his visit to the emergency department. He is not on any blood thinners, he denies chest pain, syncope, dizziness, lightheadedness, shortness of breath, abdominal pain, fever, flank pain.  Past Medical History:  Diagnosis Date  . Cancer [facility])    prostate  . High cholesterol     There are no active problems to display for this patient.   Past Surgical History:  Procedure Laterality Date  . APPENDECTOMY  1971      Allergies Review of patient's allergies indicates no known allergies.  No family history on file.  Social History Social History  Substance Use Topics  . Smoking status: Current Every Day Smoker  . Smokeless tobacco: Not on file  . Alcohol use No    Review of Systems Constitutional: Negative for fever. Eyes: Negative for visual changes. ENT: Negative for sore throat. Cardiovascular: Negative for chest pain. Respiratory: Negative for shortness of breath. Gastrointestinal: Negative for abdominal pain, vomiting or diarrhea. Genitourinary: + hematuria and dysuria. Musculoskeletal: Negative for back pain. Skin: Negative for rash. Neurological: Negative for headaches, weakness or  numbness.  ____________________________________________   PHYSICAL EXAM:  VITAL SIGNS: ED Triage Vitals [12/17/15 1013]  Enc Vitals Group     BP 132/78     Pulse Rate 74     Resp 16     Temp 97.6 F (36.4 C)     Temp Source Oral     SpO2 96 %     Weight 141 lb (64 kg)     Height 5\' 8"  (1.727 m)     Head Circumference      Peak Flow      Pain Score      Pain Loc      Pain Edu?      Excl. in [place]?     Constitutional: Alert and oriented. Well appearing and in no apparent distress. HEENT:      Head: Normocephalic and atraumatic.         Eyes: Conjunctivae are normal. Sclera is non-icteric. EOMI. PERRL      Mouth/Throat: Mucous membranes are moist.       Neck: Supple with no signs of meningismus. Cardiovascular: Regular rate and rhythm. No murmurs, gallops, or rubs. 2+ symmetrical distal pulses are present in all extremities. No JVD. Respiratory: Normal respiratory effort. Lungs are clear to auscultation bilaterally. No wheezes, crackles, or rhonchi.  Gastrointestinal: Soft, non tender, and non distended with positive bowel sounds. No rebound or guarding. Genitourinary: No CVA tenderness. Musculoskeletal: Nontender with normal range of motion in all extremities. No edema, cyanosis, or erythema of extremities. Neurologic: Normal speech and language. Face is symmetric. Moving all extremities. No gross focal neurologic deficits are appreciated. Skin: Skin is warm, dry and intact. No rash noted. Psychiatric: Mood and affect are normal. Speech  and behavior are normal.  ____________________________________________   LABS (all labs ordered are listed, but only abnormal results are displayed)  Labs Reviewed  URINALYSIS COMPLETEWITH MICROSCOPIC ([facility]) - Abnormal; Notable for the following:       Result Value   Color, Urine YELLOW (*)    APPearance CLEAR (*)    Leukocytes, UA 1+ (*)    Squamous Epithelial / LPF 0-5 (*)    All other components within normal limits  CBC WITH  DIFFERENTIAL/PLATELET - Abnormal; Notable for the following:    RBC 4.22 (*)    HCT 39.3 (*)    All other components within normal limits  URINE CULTURE   ____________________________________________  EKG  none  ____________________________________________  RADIOLOGY  none  ____________________________________________   PROCEDURES  Procedure(s) performed: None Critical Care performed:  None ____________________________________________   INITIAL IMPRESSION / ASSESSMENT AND PLAN / ED COURSE  75 y.o. male with a history of remote prostate cancer in remission and hyperlipidemia who presents for evaluation of hematuria 2 days ago, now resolved but still complaining of dysuria. No signs or symptoms of systemic infection with no fever, normal vital signs, no abdominal pain, no nausea, no vomiting, no flank pain. UA here concerning for UTI. Will check H&H and start patient on bactrim  Clinical Course  Comment By Time  Hgb normal at 13.4. Normal VS. No WBC or fever. Patient started on bactrim. Will dc home with follow up with PCP. Rudene Re, MD 07/25 1409    Pertinent labs & imaging results that were available during my care of the patient were reviewed by me and considered in my medical decision making (see chart for details).    ____________________________________________   FINAL CLINICAL IMPRESSION(S) / ED DIAGNOSES  Final diagnoses:  UTI (lower urinary tract infection)      NEW MEDICATIONS STARTED DURING THIS VISIT:  New Prescriptions   SULFAMETHOXAZOLE-TRIMETHOPRIM (BACTRIM DS,SEPTRA DS) 800-160 MG TABLET    Take 1 tablet by mouth 2 (two) times daily.     Note:  This document was prepared using Dragon voice recognition software and may include unintentional dictation errors.    Rudene Re, MD 12/17/15 [phone]

## 2015-12-18 LAB — URINE CULTURE: Culture: NO GROWTH

## 2016-05-25 DIAGNOSIS — C61 Malignant neoplasm of prostate: Secondary | ICD-10-CM

## 2016-05-25 HISTORY — DX: Malignant neoplasm of prostate: C61

## 2016-08-05 ENCOUNTER — Inpatient Hospital Stay
Admission: EM | Admit: 2016-08-05 | Discharge: 2016-08-08 | DRG: 244 | Disposition: A | Discharge status: Home / Self Care | Payer: Medicare Other | Attending: Internal Medicine | Admitting: Internal Medicine

## 2016-08-05 ENCOUNTER — Emergency Department: Payer: Medicare Other

## 2016-08-05 ENCOUNTER — Inpatient Hospital Stay
Admit: 2016-08-05 | Discharge: 2016-08-05 | Disposition: A | Discharge status: Home / Self Care | Payer: Medicare Other | Attending: Student in an Organized Health Care Education/Training Program | Admitting: Student in an Organized Health Care Education/Training Program

## 2016-08-05 ENCOUNTER — Encounter: Payer: Self-pay | Admitting: Emergency Medicine

## 2016-08-05 DIAGNOSIS — F0390 Unspecified dementia without behavioral disturbance: Secondary | ICD-10-CM | POA: Diagnosis present

## 2016-08-05 DIAGNOSIS — I119 Hypertensive heart disease without heart failure: Secondary | ICD-10-CM | POA: Diagnosis present

## 2016-08-05 DIAGNOSIS — Z7951 Long term (current) use of inhaled steroids: Secondary | ICD-10-CM | POA: Diagnosis not present

## 2016-08-05 DIAGNOSIS — E7800 Pure hypercholesterolemia, unspecified: Secondary | ICD-10-CM | POA: Diagnosis present

## 2016-08-05 DIAGNOSIS — R410 Disorientation, unspecified: Secondary | ICD-10-CM | POA: Diagnosis present

## 2016-08-05 DIAGNOSIS — Z8546 Personal history of malignant neoplasm of prostate: Secondary | ICD-10-CM | POA: Diagnosis not present

## 2016-08-05 DIAGNOSIS — Z79899 Other long term (current) drug therapy: Secondary | ICD-10-CM

## 2016-08-05 DIAGNOSIS — R001 Bradycardia, unspecified: Secondary | ICD-10-CM | POA: Diagnosis present

## 2016-08-05 DIAGNOSIS — R42 Dizziness and giddiness: Secondary | ICD-10-CM

## 2016-08-05 DIAGNOSIS — Z923 Personal history of irradiation: Secondary | ICD-10-CM | POA: Diagnosis not present

## 2016-08-05 DIAGNOSIS — E785 Hyperlipidemia, unspecified: Secondary | ICD-10-CM | POA: Diagnosis present

## 2016-08-05 DIAGNOSIS — Y92230 Patient room in hospital as the place of occurrence of the external cause: Secondary | ICD-10-CM | POA: Diagnosis present

## 2016-08-05 DIAGNOSIS — I441 Atrioventricular block, second degree: Principal | ICD-10-CM | POA: Diagnosis present

## 2016-08-05 DIAGNOSIS — I442 Atrioventricular block, complete: Secondary | ICD-10-CM

## 2016-08-05 DIAGNOSIS — Z950 Presence of cardiac pacemaker: Secondary | ICD-10-CM

## 2016-08-05 DIAGNOSIS — J449 Chronic obstructive pulmonary disease, unspecified: Secondary | ICD-10-CM | POA: Diagnosis present

## 2016-08-05 DIAGNOSIS — I517 Cardiomegaly: Secondary | ICD-10-CM | POA: Diagnosis present

## 2016-08-05 DIAGNOSIS — F17200 Nicotine dependence, unspecified, uncomplicated: Secondary | ICD-10-CM | POA: Diagnosis present

## 2016-08-05 DIAGNOSIS — T4145XA Adverse effect of unspecified anesthetic, initial encounter: Secondary | ICD-10-CM | POA: Diagnosis present

## 2016-08-05 HISTORY — DX: Cardiomegaly: I51.7

## 2016-08-05 HISTORY — DX: Personal history of malignant neoplasm of prostate: Z85.46

## 2016-08-05 HISTORY — DX: Depression, unspecified: F32.A

## 2016-08-05 HISTORY — DX: Essential (primary) hypertension: I10

## 2016-08-05 HISTORY — DX: Major depressive disorder, single episode, unspecified: F32.9

## 2016-08-05 HISTORY — DX: Dorsalgia, unspecified: M54.9

## 2016-08-05 HISTORY — DX: Cystic kidney disease, unspecified: Q61.9

## 2016-08-05 HISTORY — DX: Gastro-esophageal reflux disease without esophagitis: K21.9

## 2016-08-05 HISTORY — DX: Nontoxic single thyroid nodule: E04.1

## 2016-08-05 LAB — CBC WITH DIFFERENTIAL/PLATELET
Basophils Absolute: 0.1 10*3/uL (ref 0–0.1)
Basophils Relative: 1 %
EOS PCT: 3 %
Eosinophils Absolute: 0.1 10*3/uL (ref 0–0.7)
HEMATOCRIT: 43.9 % (ref 40.0–52.0)
Hemoglobin: 14.4 g/dL (ref 13.0–18.0)
LYMPHS ABS: 1.7 10*3/uL (ref 1.0–3.6)
Lymphocytes Relative: 36 %
MCH: 31 pg (ref 26.0–34.0)
MCHC: 32.9 g/dL (ref 32.0–36.0)
MCV: 94.2 fL (ref 80.0–100.0)
MONO ABS: 0.5 10*3/uL (ref 0.2–1.0)
MONOS PCT: 10 %
Neutro Abs: 2.4 10*3/uL (ref 1.4–6.5)
Neutrophils Relative %: 50 %
Platelets: 156 10*3/uL (ref 150–440)
RBC: 4.66 MIL/uL (ref 4.40–5.90)
RDW: 13.8 % (ref 11.5–14.5)
WBC: 4.7 10*3/uL (ref 3.8–10.6)

## 2016-08-05 LAB — BASIC METABOLIC PANEL
ANION GAP: 5 (ref 5–15)
BUN: 20 mg/dL (ref 6–20)
CO2: 28 mmol/L (ref 22–32)
Calcium: 9.3 mg/dL (ref 8.9–10.3)
Chloride: 105 mmol/L (ref 101–111)
Creatinine, Ser: 1.13 mg/dL (ref 0.61–1.24)
GLUCOSE: 95 mg/dL (ref 65–99)
POTASSIUM: 4.4 mmol/L (ref 3.5–5.1)
Sodium: 138 mmol/L (ref 135–145)

## 2016-08-05 LAB — MRSA PCR SCREENING: MRSA by PCR: NEGATIVE

## 2016-08-05 LAB — GLUCOSE, CAPILLARY
Glucose-Capillary: 61 mg/dL — ABNORMAL LOW (ref 65–99)
Glucose-Capillary: 73 mg/dL (ref 65–99)
Glucose-Capillary: 89 mg/dL (ref 65–99)

## 2016-08-05 LAB — TROPONIN I

## 2016-08-05 LAB — BRAIN NATRIURETIC PEPTIDE: B NATRIURETIC PEPTIDE 5: 263 pg/mL — AB (ref 0.0–100.0)

## 2016-08-05 LAB — ECHOCARDIOGRAM COMPLETE
Height: 68 in
WEIGHTICAEL: 2144 [oz_av]

## 2016-08-05 LAB — MAGNESIUM: Magnesium: 2 mg/dL (ref 1.7–2.4)

## 2016-08-05 MED ORDER — SALINE SPRAY 0.65 % NA SOLN
1.0000 | NASAL | Status: DC | PRN
  Administered 2016-08-06: 1 via NASAL
  Filled 2016-08-05 (×2): qty 44

## 2016-08-05 MED ORDER — ASPIRIN 81 MG PO CHEW
324.0000 mg | CHEWABLE_TABLET | Freq: Once | ORAL | Status: AC
Start: 2016-08-05 — End: 2016-08-05
  Administered 2016-08-05: 324 mg via ORAL
  Filled 2016-08-05: qty 4

## 2016-08-05 MED ORDER — ONDANSETRON HCL 4 MG PO TABS: 4.0000 mg | ORAL_TABLET | Freq: Four times a day (QID) | ORAL | Status: DC | PRN

## 2016-08-05 MED ORDER — LORATADINE 10 MG PO TABS
10.0000 mg | ORAL_TABLET | Freq: Every day | ORAL | Status: DC
  Administered 2016-08-05 – 2016-08-08 (×4): 10 mg via ORAL
  Filled 2016-08-05 (×4): qty 1

## 2016-08-05 MED ORDER — MOMETASONE FURO-FORMOTEROL FUM 200-5 MCG/ACT IN AERO
2.0000 | INHALATION_SPRAY | Freq: Two times a day (BID) | RESPIRATORY_TRACT | Status: DC
  Administered 2016-08-05 – 2016-08-08 (×5): 2 via RESPIRATORY_TRACT
  Filled 2016-08-05: qty 8.8

## 2016-08-05 MED ORDER — TAMSULOSIN HCL 0.4 MG PO CAPS
0.4000 mg | ORAL_CAPSULE | Freq: Every day | ORAL | Status: DC
  Administered 2016-08-06 – 2016-08-08 (×3): 0.4 mg via ORAL
  Filled 2016-08-05 (×3): qty 1

## 2016-08-05 MED ORDER — ACETAMINOPHEN 650 MG RE SUPP: 650.0000 mg | Freq: Four times a day (QID) | RECTAL | Status: DC | PRN

## 2016-08-05 MED ORDER — ONDANSETRON HCL 4 MG/2ML IJ SOLN
4.0000 mg | Freq: Four times a day (QID) | INTRAMUSCULAR | Status: DC | PRN
  Administered 2016-08-06: 4 mg via INTRAVENOUS

## 2016-08-05 MED ORDER — ACETAMINOPHEN 325 MG PO TABS
650.0000 mg | ORAL_TABLET | Freq: Four times a day (QID) | ORAL | Status: DC | PRN
  Administered 2016-08-05: 650 mg via ORAL
  Filled 2016-08-05: qty 2

## 2016-08-05 MED ORDER — SODIUM CHLORIDE 0.9% FLUSH
3.0000 mL | Freq: Two times a day (BID) | INTRAVENOUS | Status: DC
  Administered 2016-08-05 – 2016-08-08 (×5): 3 mL via INTRAVENOUS

## 2016-08-05 MED ORDER — ALBUTEROL SULFATE (2.5 MG/3ML) 0.083% IN NEBU
3.0000 mL | INHALATION_SOLUTION | Freq: Four times a day (QID) | RESPIRATORY_TRACT | Status: DC | PRN
Start: 2016-08-05 — End: 2016-08-08

## 2016-08-05 MED ORDER — TIOTROPIUM BROMIDE MONOHYDRATE 18 MCG IN CAPS
18.0000 ug | ORAL_CAPSULE | Freq: Every day | RESPIRATORY_TRACT | Status: DC
  Administered 2016-08-07 – 2016-08-08 (×2): 18 ug via RESPIRATORY_TRACT
  Filled 2016-08-05: qty 5

## 2016-08-05 MED ORDER — MENTHOL 3 MG MT LOZG
1.0000 | LOZENGE | OROMUCOSAL | Status: DC | PRN
  Administered 2016-08-05: 3 mg via ORAL
  Filled 2016-08-05: qty 9

## 2016-08-05 MED ORDER — HYDRALAZINE HCL 20 MG/ML IJ SOLN
10.0000 mg | Freq: Four times a day (QID) | INTRAMUSCULAR | Status: DC | PRN
  Administered 2016-08-05 – 2016-08-06 (×3): 10 mg via INTRAVENOUS
  Filled 2016-08-05 (×3): qty 1

## 2016-08-05 MED ORDER — SIMVASTATIN 20 MG PO TABS
20.0000 mg | ORAL_TABLET | Freq: Every evening | ORAL | Status: DC
  Administered 2016-08-05 – 2016-08-07 (×2): 20 mg via ORAL
  Filled 2016-08-05 (×2): qty 1

## 2016-08-05 MED ORDER — DONEPEZIL HCL 5 MG PO TABS
5.0000 mg | ORAL_TABLET | Freq: Every day | ORAL | Status: DC
  Administered 2016-08-05 – 2016-08-07 (×2): 5 mg via ORAL
  Filled 2016-08-05 (×2): qty 1

## 2016-08-05 NOTE — ED Provider Notes (Signed)
[facility] Emergency Department Provider Note    First MD Initiated Contact with Patient 08/05/16 1249     (approximate)  I have reviewed the triage vital signs and the nursing notes.   HISTORY  Chief Complaint Irregular Heart Beat    HPI James Dickerson is a 76 y.o. male history of prostate cancer as well as high cholesterol presents from outpatient clinic due to concern for third-degree heart block. 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 Pain was associated with some mild diaphoresis and lasted several hours.Denies any worsening chest pain for the past 2 days. Has had some exertional weakness and lightheadedness when standing. Denies any shortness of breath. No lower STEMI swelling.   Past Medical History:  Diagnosis Date  . Back pain   . Cancer [facility])    prostate  . Cardiomegaly   . Cystic kidney disease   . Depressive disorder   . GERD (gastroesophageal reflux disease)   . High cholesterol   . History of prostate cancer   . Hypertension   . Thyroid nodule    History reviewed. No pertinent family history. Past Surgical History:  Procedure Laterality Date  . APPENDECTOMY  1971   There are no active problems to display for this patient.     Prior to Admission medications   Medication Sig Start Date End Date Taking? Authorizing Provider  albuterol (PROVENTIL HFA;VENTOLIN HFA) 108 (90 Base) MCG/ACT inhaler Inhale 2 puffs into the lungs every 6 (six) hours as needed for wheezing or shortness of breath.   Yes Historical Provider, MD  amoxicillin-clarithromycin-lansoprazole [facility]) combo pack Take 1 each by mouth 2 (two) times daily. Follow package directions.   Yes Historical Provider, MD  budesonide-formoterol (SYMBICORT) 160-4.5 MCG/ACT inhaler Inhale 2 puffs into the lungs 2 (two) times daily.   Yes Historical Provider, MD  donepezil (ARICEPT) 5 MG tablet Take 5 mg  by mouth at bedtime.   Yes Historical Provider, MD  simvastatin (ZOCOR) 20 MG tablet Take 20 mg by mouth daily.   Yes Historical Provider, MD  tamsulosin (FLOMAX) 0.4 MG CAPS capsule Take 0.4 mg by mouth daily.   Yes Historical Provider, MD  tiotropium (SPIRIVA) 18 MCG inhalation capsule Place 18 mcg into inhaler and inhale daily.   Yes Historical Provider, MD    Allergies Patient has no known allergies.    Social History Social History  Substance Use Topics  . Smoking status: Current Every Day Smoker  . Smokeless tobacco: Never Used  . Alcohol use No    Review of Systems Patient denies headaches, rhinorrhea, blurry vision, numbness, shortness of breath, chest pain, edema, cough, abdominal pain, nausea, vomiting, diarrhea, dysuria, fevers, rashes or hallucinations unless otherwise stated above in HPI. ____________________________________________   PHYSICAL EXAM:  VITAL SIGNS: Vitals:   08/05/16 1420 08/05/16 1433  BP: (!) 201/91 (!) 207/86  Pulse: (!) 34 (!) 33  Resp: 18 13    Constitutional: Alert and oriented. Well appearing and in no acute distress. Eyes: Conjunctivae are normal. PERRL. EOMI. Head: Atraumatic. Nose: No congestion/rhinnorhea. Mouth/Throat: Mucous membranes are moist.  Oropharynx non-erythematous. Neck: No stridor. Painless ROM. No cervical spine tenderness to palpation Hematological/Lymphatic/Immunilogical: No cervical lymphadenopathy. Cardiovascular: bradycardic, regular rhythm. Grossly normal heart sounds.  Good peripheral circulation. Respiratory: Normal respiratory effort.  No retractions. Lungs CTAB. Gastrointestinal: Soft and nontender. No distention. No abdominal bruits. No CVA tenderness. Musculoskeletal: No lower extremity tenderness  nor edema.  No joint effusions. Neurologic:  Normal speech and language. No gross focal neurologic deficits are appreciated. No gait instability. Skin:  Skin is warm, dry and intact. No rash noted. Psychiatric:  Mood and affect are normal. Speech and behavior are normal.  ____________________________________________   LABS (all labs ordered are listed, but only abnormal results are displayed)  Results for orders placed or performed during the hospital encounter of 08/05/16 (from the past 24 hour(s))  CBC with Differential/Platelet     Status: None   Collection Time: 08/05/16 12:50 PM  Result Value Ref Range   WBC 4.7 3.8 - 10.6 K/uL   RBC 4.66 4.40 - 5.90 MIL/uL   Hemoglobin 14.4 13.0 - 18.0 g/dL   HCT 43.9 40.0 - 52.0 %   MCV 94.2 80.0 - 100.0 fL   MCH 31.0 26.0 - 34.0 pg   MCHC 32.9 32.0 - 36.0 g/dL   RDW 13.8 11.5 - 14.5 %   Platelets 156 150 - 440 K/uL   Neutrophils Relative % 50 %   Neutro Abs 2.4 1.4 - 6.5 K/uL   Lymphocytes Relative 36 %   Lymphs Abs 1.7 1.0 - 3.6 K/uL   Monocytes Relative 10 %   Monocytes Absolute 0.5 0.2 - 1.0 K/uL   Eosinophils Relative 3 %   Eosinophils Absolute 0.1 0 - 0.7 K/uL   Basophils Relative 1 %   Basophils Absolute 0.1 0 - 0.1 K/uL  Basic metabolic panel     Status: None   Collection Time: 08/05/16 12:50 PM  Result Value Ref Range   Sodium 138 135 - 145 mmol/L   Potassium 4.4 3.5 - 5.1 mmol/L   Chloride 105 101 - 111 mmol/L   CO2 28 22 - 32 mmol/L   Glucose, Bld 95 65 - 99 mg/dL   BUN 20 6 - 20 mg/dL   Creatinine, Ser 1.13 0.61 - 1.24 mg/dL   Calcium 9.3 8.9 - 10.3 mg/dL   GFR calc non Af Amer >60 >60 mL/min   GFR calc Af Amer >60 >60 mL/min   Anion gap 5 5 - 15  Magnesium     Status: None   Collection Time: 08/05/16 12:50 PM  Result Value Ref Range   Magnesium 2.0 1.7 - 2.4 mg/dL  Troponin I     Status: None   Collection Time: 08/05/16 12:50 PM  Result Value Ref Range   Troponin I <0.03 <0.03 ng/mL  Brain natriuretic peptide     Status: Abnormal   Collection Time: 08/05/16 12:50 PM  Result Value Ref Range   B Natriuretic Peptide 263.0 (H) 0.0 - 100.0 pg/mL   ____________________________________________  EKG My review and  personal interpretation at Time: 12:46   Indication: weakness  Rate: 35  Rhythm: third degree heart block Axis: normal Other: upright qrs in V1 and V2 ____________________________________________  RADIOLOGY  I personally reviewed all radiographic images ordered to evaluate for the above acute complaints and reviewed radiology reports and findings.  These findings were personally discussed with the patient.  Please see medical record for radiology report.  ____________________________________________   PROCEDURES  Procedure(s) performed:  Procedures    Critical Care performed: yes CRITICAL CARE Performed by: Merlyn Lot   Total critical care time: 45 minutes  Critical care time was exclusive of separately billable procedures and treating other patients.  Critical care was necessary to treat or prevent imminent or life-threatening deterioration.  Critical care was time spent personally by me on the following  activities: development of treatment plan with patient and/or surrogate as well as nursing, discussions with consultants, evaluation of patient's response to treatment, examination of patient, obtaining history from patient or surrogate, ordering and performing treatments and interventions, ordering and review of laboratory studies, ordering and review of radiographic studies, pulse oximetry and re-evaluation of patient's condition.  ____________________________________________   INITIAL IMPRESSION / ASSESSMENT AND PLAN / ED COURSE  Pertinent labs & imaging results that were available during my care of the patient were reviewed by me and considered in my medical decision making (see chart for details).  DDX: bradycardia, acs, med effect, electrolyte abn  James Dickerson is a 76 y.o. who presents to the ED with symptomatically bradycardia 2 days after having significant chest pain as described above. Arrives afebrile, bradycardic but hypertensive and in no acute distress.  EKG shows no evidence of STEMI but possible posterior Q waves.  Patient placed on pacer defibrillator. Blood work will be obtained to evaluate for any O abnormalities or evidence of acute ischemia.  The patient will be placed on continuous pulse oximetry and telemetry for monitoring.  Laboratory evaluation will be sent to evaluate for the above complaints.     Clinical Course as of Aug 05 2224  Wed Aug 05, 2016  1327 Electrolyes are normal.  Trop negative.  Remains HDS  [PR]  1332 CXR wnl.  [PR]  6415 Have repaged cardiology for further recommendations.  Awaiting call back.  Patient remains HDS.  [PR]  8309 Spoke with Dr. Saralyn Pilar recommends no medical interventions at this time as he is currently normotensive and asymptomatic. He will be evaluated the patient shortly. Agrees with admission to hospital. Has recommended stat echocardiogram which I will obtain.  Will continue to monitor patient.  Have discussed with the patient and available family all diagnostics and treatments performed thus far and all questions were answered to the best of my ability. The patient demonstrates understanding and agreement with plan.   [PR]    Clinical Course User Index [PR] Merlyn Lot, MD     ____________________________________________   FINAL CLINICAL IMPRESSION(S) / ED DIAGNOSES  Final diagnoses:  Heart block AV complete ([place])  Symptomatic bradycardia  Lightheadedness      NEW MEDICATIONS STARTED DURING THIS VISIT:  New Prescriptions   No medications on file     Note:  This document was prepared using Dragon voice recognition software and may include unintentional dictation errors.    Merlyn Lot, MD 08/05/16 2226

## 2016-08-05 NOTE — Progress Notes (Signed)
*  PRELIMINARY RESULTS* Echocardiogram 2D Echocardiogram has been performed.  Sherrie Sport 08/05/2016, 4:03 PM

## 2016-08-05 NOTE — ED Notes (Signed)
cardiopulm in to do echo.

## 2016-08-05 NOTE — Care Management Note (Signed)
Case Management Note  Patient Details  Name: James Dickerson MRN: [id number] Date of Birth: [birth year]  Subjective/Objective:             Presents from PCP office after bradycardia noted.  Admitted to icu stepdown and for permanent pacemaker maker 3.15.2018 for heart block. Independent in all adls, denies issues accessing medical care, obtaining medications or with transportation.  Current with PCP.    Action/Plan:   Expected Discharge Date:                  Expected Discharge Plan:     In-House Referral:     Discharge planning Services     Post Acute Care Choice:    Choice offered to:     DME Arranged:    DME Agency:     HH Arranged:    HH Agency:     Status of Service:     If discussed at [employer] of Stay Meetings, dates discussed:    Additional Comments:  Katrina Stack, RN 08/05/2016, 5:45 PM

## 2016-08-05 NOTE — ED Notes (Signed)
Pt placed on pacer/defib pads

## 2016-08-05 NOTE — H&P (Signed)
[facility] NAME: James Dickerson    MR#:  [id number]  DATE OF BIRTH:  1941-05-16  DATE OF ADMISSION:  08/05/2016  PRIMARY CARE PHYSICIAN: Lorelee Market, MD   REQUESTING/REFERRING PHYSICIAN: Dr. Merlyn Lot  CHIEF COMPLAINT:   Chief Complaint  Patient presents with  . Irregular Heart Beat    HISTORY OF PRESENT ILLNESS:  James Dickerson  is a 76 y.o. male with a known history of Hypertension, hyperlipidemia, GERD, history of prostate cancer status post radiation presents to the hospital from his primary care physician's office secondary to bradycardia. Patient feels fine now. Had an episode of chest pain working out 2 days ago associated with dyspnea and weakness. For the last 2 days he has been feeling lightheaded and generalized malaise. Denies any fevers or chills. No nausea or vomiting. He does not have any further chest pains. He went to see his PCP today for her routine visit and in their office noted to have bradycardia with heart rate in the 30s and so sent over to the emergency room. EKG shows trace to 1 second-degree heart block. First set of troponins are negative. Patient is being admitted for evaluation for possible pacemaker placement  PAST MEDICAL HISTORY:   Past Medical History:  Diagnosis Date  . Back pain   . Cancer [facility])    prostate  . Cardiomegaly   . Cystic kidney disease   . Depressive disorder   . GERD (gastroesophageal reflux disease)   . High cholesterol   . History of prostate cancer   . Hypertension   . Thyroid nodule     PAST SURGICAL HISTORY:   Past Surgical History:  Procedure Laterality Date  . APPENDECTOMY  1971    SOCIAL HISTORY:   Social History  Substance Use Topics  . Smoking status: Current Every Day Smoker  . Smokeless tobacco: Never Used  . Alcohol use No    FAMILY HISTORY:  History reviewed. No pertinent family history.  DRUG ALLERGIES:  No Known  Allergies  REVIEW OF SYSTEMS:   Review of Systems  Constitutional: Positive for malaise/fatigue. Negative for chills, fever and weight loss.  HENT: Negative for ear discharge, ear pain, hearing loss and nosebleeds.   Eyes: Negative for blurred vision, double vision and photophobia.  Respiratory: Negative for cough, hemoptysis, shortness of breath and wheezing.   Cardiovascular: Negative for chest pain, palpitations, orthopnea and leg swelling.  Gastrointestinal: Negative for abdominal pain, constipation, diarrhea, heartburn, melena, nausea and vomiting.  Genitourinary: Negative for dysuria and urgency.  Musculoskeletal: Negative for myalgias and neck pain.  Skin: Negative for rash.  Neurological: Positive for dizziness. Negative for tingling, sensory change, speech change and focal weakness.  Endo/Heme/Allergies: Does not bruise/bleed easily.  Psychiatric/Behavioral: Negative for depression.    MEDICATIONS AT HOME:   Prior to Admission medications   Medication Sig Start Date End Date Taking? Authorizing Provider  albuterol (PROVENTIL HFA;VENTOLIN HFA) 108 (90 Base) MCG/ACT inhaler Inhale 2 puffs into the lungs every 6 (six) hours as needed for wheezing or shortness of breath.   Yes Historical Provider, MD  amoxicillin-clarithromycin-lansoprazole [facility]) combo pack Take 1 each by mouth 2 (two) times daily. Follow package directions.   Yes Historical Provider, MD  budesonide-formoterol (SYMBICORT) 160-4.5 MCG/ACT inhaler Inhale 2 puffs into the lungs 2 (two) times daily.   Yes Historical Provider, MD  donepezil (ARICEPT) 5 MG tablet Take 5 mg by mouth at bedtime.   Yes Historical Provider,  MD  simvastatin (ZOCOR) 20 MG tablet Take 20 mg by mouth daily.   Yes Historical Provider, MD  tamsulosin (FLOMAX) 0.4 MG CAPS capsule Take 0.4 mg by mouth daily.   Yes Historical Provider, MD  tiotropium (SPIRIVA) 18 MCG inhalation capsule Place 18 mcg into inhaler and inhale daily.   Yes Historical  Provider, MD      VITAL SIGNS:  Blood pressure (!) 207/86, pulse (!) 33, resp. rate 13, height 5\' 8"  (1.727 m), weight 60.8 kg (134 lb), SpO2 100 %.  PHYSICAL EXAMINATION:   Physical Exam  GENERAL:  76 y.o.-year-old patient lying in the bed with no acute distress.  EYES: Pupils equal, round, reactive to light and accommodation. No scleral icterus. Extraocular muscles intact.  HEENT: Head atraumatic, normocephalic. Oropharynx and nasopharynx clear.  NECK:  Supple, no jugular venous distention. No thyroid enlargement, no tenderness.  LUNGS: Normal breath sounds bilaterally, no wheezing, rales,rhonchi or crepitation. No use of accessory muscles of respiration.  CARDIOVASCULAR: S1, S2 normal. No murmurs, rubs, or gallops.  ABDOMEN: Soft, nontender, nondistended. Bowel sounds present. No organomegaly or mass.  EXTREMITIES: No pedal edema, cyanosis, or clubbing.  NEUROLOGIC: Cranial nerves II through XII are intact. Muscle strength 5/5 in all extremities. Sensation intact. Gait not checked.  PSYCHIATRIC: The patient is alert and oriented x 3.  SKIN: No obvious rash, lesion, or ulcer.   LABORATORY PANEL:   CBC  Recent Labs Lab 08/05/16 1250  WBC 4.7  HGB 14.4  HCT 43.9  PLT 156   ------------------------------------------------------------------------------------------------------------------  Chemistries   Recent Labs Lab 08/05/16 1250  NA 138  K 4.4  CL 105  CO2 28  GLUCOSE 95  BUN 20  CREATININE 1.13  CALCIUM 9.3  MG 2.0   ------------------------------------------------------------------------------------------------------------------  Cardiac Enzymes  Recent Labs Lab 08/05/16 1250  TROPONINI <0.03   ------------------------------------------------------------------------------------------------------------------  RADIOLOGY:  Dg Chest Portable 1 View  Result Date: 08/05/2016 CLINICAL DATA:  Weakness and bradycardia.  Evaluate for CHF. EXAM: PORTABLE  CHEST 1 VIEW COMPARISON:  08/09/2011 FINDINGS: The heart size and mediastinal contours are within normal limits. Both lungs are clear. No pleural effusion or edema. The visualized skeletal structures are unremarkable. IMPRESSION: No evidence for CHF. Electronically Signed   By: Kerby Moors M.D.   On: 08/05/2016 13:06    EKG:   Orders placed or performed during the hospital encounter of 08/05/16  . EKG 12-Lead  . EKG 12-Lead  . ED EKG  . ED EKG  . ED EKG  . ED EKG  . EKG 12-Lead  . EKG 12-Lead    IMPRESSION AND PLAN:   James Dickerson  is a 76 y.o. male with a known history of Hypertension, hyperlipidemia, GERD, history of prostate cancer status post radiation presents to the hospital from his primary care physician's office secondary to bradycardia.  #1 second-degree heart block-minimally symptomatic at this time. Hemodynamically stable. -Cardiology has been consulted. Temporary pacer pads on. -Stat echocardiogram today. If echo is normal, consider pacemaker placement tomorrow. -If echo is abnormal, might need further cardiac evaluation including a cardiac catheterization. - will be admitted to stepdown. Troponin is negative at this time. Recycle troponins.  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  #3 hypertension-not on any blood pressure medications at home. IV hydralazine when necessary at this time. Monitor closely with his bradycardia  #4 DVT prophylaxis-we will hold off until the procedure.   All the records are reviewed and case discussed with ED provider. Management plans discussed with the patient,  family and they are in agreement.  CODE STATUS: Full Code  TOTAL CRITICAL CARE TIME SPENT TAKING CARE OF THIS PATIENT: 55 minutes.    Gladstone Lighter M.D on 08/05/2016 at 3:17 PM  Between 7am to 6pm - Pager - [phone]  After 6pm go to [email or web address] - password [email or web address]  Office  [phone]  CC: Primary care physician; Lorelee Market, MD

## 2016-08-05 NOTE — ED Triage Notes (Signed)
Pt was at pcp for routine visit and found with 3rd degree heart block. Denies any symptoms. Has felt weak when stood for couple days.

## 2016-08-05 NOTE — Plan of Care (Signed)
Results for JQUAN, EGELSTON (MRN [id number]) as of 08/05/2016 17:40  Ref. Range 08/05/2016 17:11 08/05/2016 17:32  Glucose-Capillary Latest Ref Range: 65 - 99 mg/dL 61 (L) 73    Gave pt apple juice, MD aware. Pt eating dinner currently

## 2016-08-05 NOTE — ED Notes (Addendum)
Pt remains asymptomatic to 3rd degree block. NAD. Remains on monitor. Alert and oriented. Waiting on cardiology consult. Remains on pace/defib pads.

## 2016-08-05 NOTE — ED Notes (Signed)
Echo being performed in room.

## 2016-08-05 NOTE — Consult Note (Signed)
[facility]  CARDIOLOGY CONSULT NOTE  Patient ID: James Dickerson MRN: [id number] DOB/AGE: 77-Mar-1942 77 y.o.  Admit date: 08/05/2016 Referring Physician Tressia Miners Primary Physician [facility] Primary Cardiologist  Reason for Consultation heart block  HPI: 76 year old gentleman referred for evaluation of bradycardia. The patient apparently was in his usual state of health. He experienced a recent episode of chest pain. He reports chronic exertional dyspnea and episodic dizziness for the past 6 months. He went to see his primary care provider earlier today for apparent routine visit and was noted to be bradycardic. The patient was sent to [facility] emergency room where ECG revealed 2 second degree AV block at a rate of 42 bpm. The patient denies presyncope or syncope. The patient was hypertensive and hemodynamically stable. He currently is not taking any AV nodal blocking agents.  Review of systems complete and found to be negative unless listed above     Past Medical History:  Diagnosis Date  . Back pain   . Cancer [facility])    prostate  . Cardiomegaly   . Cystic kidney disease   . Depressive disorder   . GERD (gastroesophageal reflux disease)   . High cholesterol   . History of prostate cancer   . Hypertension   . Thyroid nodule     Past Surgical History:  Procedure Laterality Date  . APPENDECTOMY  1971    Prescriptions Prior to Admission  Medication Sig Dispense Refill Last Dose  . albuterol (PROVENTIL HFA;VENTOLIN HFA) 108 (90 Base) MCG/ACT inhaler Inhale 2 puffs into the lungs every 6 (six) hours as needed for wheezing or shortness of breath.   prn at prn  . amoxicillin-clarithromycin-lansoprazole (PREVPAC) combo pack Take 1 each by mouth 2 (two) times daily. Follow package directions.   Past Week at Unknown time  . budesonide-formoterol (SYMBICORT) 160-4.5 MCG/ACT inhaler Inhale 2 puffs into the lungs 2 (two) times daily.   Past Week at Unknown time  . donepezil  (ARICEPT) 5 MG tablet Take 5 mg by mouth at bedtime.   Past Week at Unknown time  . simvastatin (ZOCOR) 20 MG tablet Take 20 mg by mouth daily.   Past Week at Unknown time  . tamsulosin (FLOMAX) 0.4 MG CAPS capsule Take 0.4 mg by mouth daily.   Past Week at Unknown time  . tiotropium (SPIRIVA) 18 MCG inhalation capsule Place 18 mcg into inhaler and inhale daily.   Past Week at Unknown time   Social History   Social History  . Marital status: Single    Spouse name: N/A  . Number of children: N/A  . Years of education: N/A   Occupational History  . Not on file.   Social History Main Topics  . Smoking status: Current Every Day Smoker  . Smokeless tobacco: Never Used  . Alcohol use No  . Drug use: No  . Sexual activity: Not on file   Other Topics Concern  . Not on file   Social History Narrative  . No narrative on file    History reviewed. No pertinent family history.    Review of systems complete and found to be negative unless listed above      PHYSICAL EXAM  General: Well developed, well nourished, in no acute distress HEENT:  Normocephalic and atramatic Neck:  No JVD.  Lungs: Clear bilaterally to auscultation and percussion. Heart: HRRR . Normal S1 and S2 without gallops or murmurs.  Abdomen: Bowel sounds are positive, abdomen soft and non-tender  Msk:  Back  normal, normal gait. Normal strength and tone for age. Extremities: No clubbing, cyanosis or edema.   Neuro: Alert and oriented X 3. Psych:  Good affect, responds appropriately  Labs:   Lab Results  Component Value Date   WBC 4.7 08/05/2016   HGB 14.4 08/05/2016   HCT 43.9 08/05/2016   MCV 94.2 08/05/2016   PLT 156 08/05/2016    Recent Labs Lab 08/05/16 1250  NA 138  K 4.4  CL 105  CO2 28  BUN 20  CREATININE 1.13  CALCIUM 9.3  GLUCOSE 95   Lab Results  Component Value Date   CKTOTAL 138 08/09/2011   CKMB 2.7 08/09/2011   TROPONINI <0.03 08/05/2016    Lab Results  Component Value Date    CHOL 209 (H) 08/09/2011   Lab Results  Component Value Date   HDL 49 08/09/2011   Lab Results  Component Value Date   LDLCALC 134 (H) 08/09/2011   Lab Results  Component Value Date   TRIG 129 08/09/2011   No results found for: CHOLHDL No results found for: LDLDIRECT    Radiology: Dg Chest Portable 1 View  Result Date: 08/05/2016 CLINICAL DATA:  Weakness and bradycardia.  Evaluate for CHF. EXAM: PORTABLE CHEST 1 VIEW COMPARISON:  08/09/2011 FINDINGS: The heart size and mediastinal contours are within normal limits. Both lungs are clear. No pleural effusion or edema. The visualized skeletal structures are unremarkable. IMPRESSION: No evidence for CHF. Electronically Signed   By: Kerby Moors M.D.   On: 08/05/2016 13:06    EKG: Type II second degree AV block  ASSESSMENT AND PLAN:   1. 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 COPD  Recommendations  1. Proceed with dual-chamber pacemaker implantation on 08/06/2016. The risks, benefits alternatives of permanent pacemaker implantation were explained to the patient and informed written consent was obtained.  Signed: Isaias Cowman MD,PhD, [facility] 08/05/2016, 5:00 PM

## 2016-08-06 ENCOUNTER — Inpatient Hospital Stay: Payer: Medicare Other

## 2016-08-06 ENCOUNTER — Encounter
Admission: EM | Disposition: A | Discharge status: Home / Self Care | Payer: Self-pay | Source: Home / Self Care | Attending: Internal Medicine

## 2016-08-06 ENCOUNTER — Inpatient Hospital Stay: Payer: Medicare Other | Admitting: Anesthesiology

## 2016-08-06 ENCOUNTER — Encounter: Payer: Self-pay | Admitting: Anesthesiology

## 2016-08-06 HISTORY — PX: PACEMAKER INSERTION: SHX728

## 2016-08-06 LAB — BASIC METABOLIC PANEL
ANION GAP: 5 (ref 5–15)
BUN: 18 mg/dL (ref 6–20)
CHLORIDE: 106 mmol/L (ref 101–111)
CO2: 24 mmol/L (ref 22–32)
Calcium: 8.9 mg/dL (ref 8.9–10.3)
Creatinine, Ser: 0.9 mg/dL (ref 0.61–1.24)
GFR calc Af Amer: >60 mL/min (ref >60)
GFR calc non Af Amer: >60 mL/min (ref >60)
Glucose, Bld: 126 mg/dL — ABNORMAL HIGH (ref 65–99)
POTASSIUM: 3.8 mmol/L (ref 3.5–5.1)
SODIUM: 135 mmol/L (ref 135–145)

## 2016-08-06 LAB — CBC
HCT: 40.7 % (ref 40.0–52.0)
HEMOGLOBIN: 13.7 g/dL (ref 13.0–18.0)
MCH: 31.2 pg (ref 26.0–34.0)
MCHC: 33.6 g/dL (ref 32.0–36.0)
MCV: 93 fL (ref 80.0–100.0)
PLATELETS: 148 10*3/uL — AB (ref 150–440)
RBC: 4.38 MIL/uL — AB (ref 4.40–5.90)
RDW: 13.6 % (ref 11.5–14.5)
WBC: 4.4 10*3/uL (ref 3.8–10.6)

## 2016-08-06 LAB — TROPONIN I

## 2016-08-06 SURGERY — INSERTION, CARDIAC PACEMAKER
Anesthesia: General | Wound class: Clean

## 2016-08-06 MED ORDER — OXYCODONE HCL 5 MG PO TABS: 5.0000 mg | ORAL_TABLET | Freq: Once | ORAL | Status: DC | PRN

## 2016-08-06 MED ORDER — FENTANYL CITRATE (PF) 100 MCG/2ML IJ SOLN
INTRAMUSCULAR | Status: AC
  Filled 2016-08-06: qty 2

## 2016-08-06 MED ORDER — LIDOCAINE 1 % OPTIME INJ - NO CHARGE
INTRAMUSCULAR | Status: DC | PRN
  Administered 2016-08-06: 30 mL

## 2016-08-06 MED ORDER — PROPOFOL 500 MG/50ML IV EMUL
INTRAVENOUS | Status: DC | PRN
  Administered 2016-08-06: 100 ug/kg/min via INTRAVENOUS

## 2016-08-06 MED ORDER — FLUMAZENIL 0.5 MG/5ML IV SOLN
INTRAVENOUS | Status: AC
Start: 2016-08-06 — End: 2016-08-07
  Filled 2016-08-06: qty 5

## 2016-08-06 MED ORDER — FLUMAZENIL 0.5 MG/5ML IV SOLN
INTRAVENOUS | Status: DC | PRN
  Administered 2016-08-06: 0.2 mg via INTRAVENOUS

## 2016-08-06 MED ORDER — MIDAZOLAM HCL 2 MG/2ML IJ SOLN
INTRAMUSCULAR | Status: DC | PRN
  Administered 2016-08-06: 2 mg via INTRAVENOUS

## 2016-08-06 MED ORDER — CEFAZOLIN SODIUM 1 G IJ SOLR
INTRAMUSCULAR | Status: DC | PRN
  Administered 2016-08-06: 2 g via INTRAMUSCULAR

## 2016-08-06 MED ORDER — HALOPERIDOL LACTATE 5 MG/ML IJ SOLN
1.0000 mg | Freq: Four times a day (QID) | INTRAMUSCULAR | Status: DC | PRN
  Administered 2016-08-06 – 2016-08-07 (×2): 1 mg via INTRAVENOUS
  Filled 2016-08-06 (×2): qty 1

## 2016-08-06 MED ORDER — OXYCODONE HCL 5 MG/5ML PO SOLN: 5.0000 mg | Freq: Once | ORAL | Status: DC | PRN

## 2016-08-06 MED ORDER — HALOPERIDOL LACTATE 5 MG/ML IJ SOLN
INTRAMUSCULAR | Status: AC
  Administered 2016-08-06: 2.5 mg via INTRAVENOUS
  Filled 2016-08-06: qty 1

## 2016-08-06 MED ORDER — OXYCODONE HCL 5 MG PO TABS
5.0000 mg | ORAL_TABLET | Freq: Once | ORAL | Status: AC | PRN
  Administered 2016-08-08: 5 mg via ORAL
  Filled 2016-08-06 (×2): qty 1

## 2016-08-06 MED ORDER — PROPOFOL 500 MG/50ML IV EMUL
INTRAVENOUS | Status: AC
  Filled 2016-08-06: qty 50

## 2016-08-06 MED ORDER — SODIUM CHLORIDE 0.9 % IR SOLN
Status: DC | PRN
  Administered 2016-08-06: 300 mL

## 2016-08-06 MED ORDER — HALOPERIDOL LACTATE 5 MG/ML IJ SOLN
2.5000 mg | Freq: Once | INTRAMUSCULAR | Status: AC
  Administered 2016-08-06: 2.5 mg via INTRAVENOUS

## 2016-08-06 MED ORDER — FENTANYL CITRATE (PF) 100 MCG/2ML IJ SOLN: 25.0000 ug | INTRAMUSCULAR | Status: DC | PRN

## 2016-08-06 MED ORDER — LORAZEPAM 2 MG/ML IJ SOLN
2.0000 mg | Freq: Once | INTRAMUSCULAR | Status: AC
  Administered 2016-08-06: 2 mg via INTRAVENOUS
  Filled 2016-08-06: qty 1

## 2016-08-06 MED ORDER — HALOPERIDOL LACTATE 5 MG/ML IJ SOLN
INTRAMUSCULAR | Status: AC
  Filled 2016-08-06: qty 1

## 2016-08-06 MED ORDER — SODIUM CHLORIDE 0.9 % IV SOLN
INTRAVENOUS | Status: DC | PRN
  Administered 2016-08-06: 12:00:00 via INTRAVENOUS

## 2016-08-06 MED ORDER — ENOXAPARIN SODIUM 40 MG/0.4ML ~~LOC~~ SOLN
40.0000 mg | SUBCUTANEOUS | Status: DC
Start: 2016-08-07 — End: 2016-08-08
  Administered 2016-08-07 – 2016-08-08 (×2): 40 mg via SUBCUTANEOUS
  Filled 2016-08-06 (×2): qty 0.4

## 2016-08-06 MED ORDER — MIDAZOLAM HCL 2 MG/2ML IJ SOLN
INTRAMUSCULAR | Status: AC
  Filled 2016-08-06: qty 2

## 2016-08-06 MED ORDER — LIDOCAINE HCL (PF) 2 % IJ SOLN
INTRAMUSCULAR | Status: AC
  Filled 2016-08-06: qty 2

## 2016-08-06 MED ORDER — PROPOFOL 10 MG/ML IV BOLUS
INTRAVENOUS | Status: AC
  Filled 2016-08-06: qty 20

## 2016-08-06 MED ORDER — ONDANSETRON HCL 4 MG/2ML IJ SOLN
INTRAMUSCULAR | Status: AC
  Filled 2016-08-06: qty 2

## 2016-08-06 MED ORDER — HALOPERIDOL LACTATE 5 MG/ML IJ SOLN
2.5000 mg | Freq: Once | INTRAMUSCULAR | Status: AC
  Administered 2016-08-06: 2.5 mg via INTRAVENOUS
  Filled 2016-08-06: qty 0.5

## 2016-08-06 SURGICAL SUPPLY — 35 items
BAG DECANTER FOR FLEXI CONT (MISCELLANEOUS) ×2 IMPLANT
BLADE PHOTON ILLUMINATED (MISCELLANEOUS) IMPLANT
BRUSH SCRUB 4% CHG (MISCELLANEOUS) ×2 IMPLANT
CABLE SURG 12 DISP A/V CHANNEL (MISCELLANEOUS) ×4 IMPLANT
CANISTER SUCT 1200ML W/VALVE (MISCELLANEOUS) ×2 IMPLANT
CHLORAPREP W/TINT 26ML (MISCELLANEOUS) ×2 IMPLANT
COVER LIGHT HANDLE STERIS (MISCELLANEOUS) ×4 IMPLANT
COVER MAYO STAND STRL (DRAPES) ×2 IMPLANT
DRAPE C-ARM XRAY 36X54 (DRAPES) ×2 IMPLANT
DRSG TEGADERM 4X4.75 (GAUZE/BANDAGES/DRESSINGS) ×2 IMPLANT
DRSG TELFA 4X3 1S NADH ST (GAUZE/BANDAGES/DRESSINGS) ×2 IMPLANT
ELECT REM PT RETURN 9FT ADLT (ELECTROSURGICAL) ×2
ELECTRODE REM PT RTRN 9FT ADLT (ELECTROSURGICAL) ×1 IMPLANT
GLOVE BIO SURGEON STRL SZ7.5 (GLOVE) ×2 IMPLANT
GLOVE BIO SURGEON STRL SZ8 (GLOVE) ×2 IMPLANT
GOWN STRL REUS W/ TWL LRG LVL3 (GOWN DISPOSABLE) ×1 IMPLANT
GOWN STRL REUS W/ TWL XL LVL3 (GOWN DISPOSABLE) ×1 IMPLANT
GOWN STRL REUS W/TWL LRG LVL3 (GOWN DISPOSABLE) ×1
GOWN STRL REUS W/TWL XL LVL3 (GOWN DISPOSABLE) ×1
IMMOBILIZER SHDR MD LX WHT (SOFTGOODS) IMPLANT
IMMOBILIZER SHDR XL LX WHT (SOFTGOODS) IMPLANT
INTRO PACEMKR SHEATH II 7FR (MISCELLANEOUS) ×2
INTRODUCER PACEMKR SHTH II 7FR (MISCELLANEOUS) ×1 IMPLANT
IPG PACE AZUR XT DR MRI W1DR01 (Pacemaker) ×1 IMPLANT
IV NS 500ML (IV SOLUTION) ×1
IV NS 500ML BAXH (IV SOLUTION) ×1 IMPLANT
KIT RM TURNOVER STRD PROC AR (KITS) ×2 IMPLANT
LABEL OR SOLS (LABEL) ×2 IMPLANT
LEAD CAPSURE NOVUS 5076-52CM (Lead) ×2 IMPLANT
LEAD CAPSURE NOVUS 5076-58CM (Lead) ×2 IMPLANT
MARKER SKIN DUAL TIP RULER LAB (MISCELLANEOUS) ×2 IMPLANT
PACE AZURE XT DR MRI W1DR01 (Pacemaker) ×2 IMPLANT
PACK PACE INSERTION (MISCELLANEOUS) ×2 IMPLANT
PAD ONESTEP ZOLL R SERIES ADT (MISCELLANEOUS) ×2 IMPLANT
SUT SILK 0 SH 30 (SUTURE) ×6 IMPLANT

## 2016-08-06 NOTE — Anesthesia Post-op Follow-up Note (Cosign Needed)
Anesthesia QCDR form completed.        

## 2016-08-06 NOTE — Progress Notes (Signed)
Patient refuses chest xray, and sling. Advised importance and patient stated he did not care. Reported to oncoming nurse.

## 2016-08-06 NOTE — Progress Notes (Signed)
Patient arrived to ccu- refusing to adjust leads, place gown. Patient refuses chest xray. States not to touch him. Cycled blood pressure but patient moving around it is not accurate. No family at bedside. Will attempt to obtain a chest xray once patient has settled down. Dr Josefa Half aware.

## 2016-08-06 NOTE — Progress Notes (Signed)
Called dr Josefa Half to let him know that pt will not let staff do CXR

## 2016-08-06 NOTE — Anesthesia Procedure Notes (Signed)
Date/Time: 08/06/2016 12:15 PM Performed by: Nelda Marseille Pre-anesthesia Checklist: Patient identified, Emergency Drugs available, Suction available, Patient being monitored and Timeout performed Oxygen Delivery Method: Simple face mask

## 2016-08-06 NOTE — Transfer of Care (Signed)
Immediate Anesthesia Transfer of Care Note  Patient: Styles Calzada  Procedure(s) Performed: Procedure(s): INSERTION PACEMAKER (N/A)  Patient Location: PACU  Anesthesia Type:General  Level of Consciousness: awake, pateint uncooperative and confused  Airway & Oxygen Therapy: Patient Spontanous Breathing  Post-op Assessment: Report given to RN, Post -op Vital signs reviewed and stable and Patient moving all extremities X 4  Post vital signs: Reviewed and stable  Last Vitals:  Vitals:   08/06/16 1330 08/06/16 1340  BP: (!) 127/91   Pulse: (!) 114 96  Resp: 12 14  Temp: 36.2 C     Last Pain:  Vitals:   08/06/16 0400  TempSrc: Oral         Complications: No apparent anesthesia complications

## 2016-08-06 NOTE — Anesthesia Postprocedure Evaluation (Signed)
Anesthesia Post Note  Patient: Presenter, broadcasting  Procedure(s) Performed: Procedure(s) (LRB): INSERTION PACEMAKER (N/A)  Patient location during evaluation: PACU Anesthesia Type: General Level of consciousness: awake and alert Pain management: pain level controlled Vital Signs Assessment: post-procedure vital signs reviewed and stable Respiratory status: spontaneous breathing, nonlabored ventilation, respiratory function stable and patient connected to nasal cannula oxygen Cardiovascular status: blood pressure returned to baseline and stable Postop Assessment: no signs of nausea or vomiting Anesthetic complications: no Comments: Delirium which seems to be resolving      Last Vitals:  Vitals:   08/06/16 1448 08/06/16 1507  BP:  (!) 151/104  Pulse: 70 78  Resp: 16 16  Temp:      Last Pain:  Vitals:   08/06/16 0400  TempSrc: Oral                 Precious Haws Piscitello

## 2016-08-06 NOTE — Progress Notes (Signed)
[facility] [place] NAME: James Dickerson    MR#:  [id number]  DATE OF BIRTH:  [birth year]  SUBJECTIVE:  Patient just got back from procedure. He is status post pacemaker placement. He is a little irritated goal. Family in the room. Heart rate up in the 70s.  REVIEW OF SYSTEMS:   Review of Systems  Constitutional: Negative for chills, fever and weight loss.  HENT: Negative for ear discharge, ear pain and nosebleeds.   Eyes: Negative for blurred vision, pain and discharge.  Respiratory: Negative for sputum production, shortness of breath, wheezing and stridor.   Cardiovascular: Negative for chest pain, palpitations, orthopnea and PND.  Gastrointestinal: Negative for abdominal pain, diarrhea, nausea and vomiting.  Genitourinary: Negative for frequency and urgency.  Musculoskeletal: Negative for back pain and joint pain.  Neurological: Positive for weakness. Negative for sensory change, speech change and focal weakness.  Psychiatric/Behavioral: Negative for depression and hallucinations. The patient is not nervous/anxious.    Tolerating Diet: Tolerating PT:   DRUG ALLERGIES:  No Known Allergies  VITALS:  Blood pressure (!) 151/104, pulse 78, temperature 97.1 F (36.2 C), resp. rate 16, height 5\' 8"  (1.727 m), weight 59.8 kg (131 lb 13.4 oz), SpO2 93 %.  PHYSICAL EXAMINATION:   Physical Exam  GENERAL:  76 y.o.-year-old patient lying in the bed with no acute distress.  EYES: Pupils equal, round, reactive to light and accommodation. No scleral icterus. Extraocular muscles intact.  HEENT: Head atraumatic, normocephalic. Oropharynx and nasopharynx clear.  NECK:  Supple, no jugular venous distention. No thyroid enlargement, no tenderness.  LUNGS: Normal breath sounds bilaterally, no wheezing, rales, rhonchi. No use of accessory muscles of respiration.  CARDIOVASCULAR: S1, S2 normal. No murmurs, rubs, or gallops.  ABDOMEN: Soft,  nontender, nondistended. Bowel sounds present. No organomegaly or mass.  EXTREMITIES: No cyanosis, clubbing or edema b/l.    NEUROLOGIC: Cranial nerves II through XII are intact. No focal Motor or sensory deficits b/l.   PSYCHIATRIC:  patient is alert and oriented x 3.  SKIN: No obvious rash, lesion, or ulcer.   LABORATORY PANEL:  CBC  Recent Labs Lab 08/06/16 0551  WBC 4.4  HGB 13.7  HCT 40.7  PLT 148*    Chemistries   Recent Labs Lab 08/05/16 1250 08/06/16 0551  NA 138 135  K 4.4 3.8  CL 105 106  CO2 28 24  GLUCOSE 95 126*  BUN 20 18  CREATININE 1.13 0.90  CALCIUM 9.3 8.9  MG 2.0  --    Cardiac Enzymes  Recent Labs Lab 08/06/16 0551  TROPONINI <0.03   RADIOLOGY:  Dg Chest Portable 1 View  Result Date: 08/05/2016 CLINICAL DATA:  Weakness and bradycardia.  Evaluate for CHF. EXAM: PORTABLE CHEST 1 VIEW COMPARISON:  08/09/2011 FINDINGS: The heart size and mediastinal contours are within normal limits. Both lungs are clear. No pleural effusion or edema. The visualized skeletal structures are unremarkable. IMPRESSION: No evidence for CHF. Electronically Signed   By: Kerby Moors M.D.   On: 08/05/2016 13:06   Dg C-arm 61-120 Min-no Report  Result Date: 08/06/2016 Fluoroscopy was utilized by the requesting physician.  No radiographic interpretation.   ASSESSMENT AND PLAN:  James Dickerson  is a 76 y.o. male with a known history of Hypertension, hyperlipidemia, GERD, history of prostate cancer status post radiation presents to the hospital from his primary care physician's office secondary to bradycardia.  #1 second-degree heart block-minimally symptomatic at this time. Hemodynamically  stable. -CardiologyConsult with Dr. Lorinda Creed appreciated. Patient is status post pacemaker placement. -Echo shows EF of 65%-Troponin is negative at this time.  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  #3 hypertension-not on any blood pressure medications at home. IV hydralazine when  necessary at this time. Monitor closely with his bradycardia  #4 DVT prophylaxis lovenox  Spoke with daughter at length.  Case discussed with Care Management/Social Worker. Management plans discussed with the patient, family and they are in agreement.  CODE STATUS: full  DVT Prophylaxis: Lovnoex  TOTAL TIME TAKING CARE OF THIS PATIENT: 30 minutes.  >50% time spent on counselling and coordination of care  POSSIBLE D/C IN 1* DAYS, DEPENDING ON CLINICAL CONDITION.  Note: This dictation was prepared with Dragon dictation along with smaller phrase technology. Any transcriptional errors that result from this process are unintentional.  Icelyn Navarrete M.D on 08/06/2016 at 3:47 PM  Between 7am to 6pm - Pager - [phone]  After 6pm go to [email or web address] - password [email or web address] [facility]  Office  [phone]  CC: Primary care physician; Lorelee Market, MD

## 2016-08-06 NOTE — Progress Notes (Signed)
Dr Amie Critchley  Talked to family members eariler    Pt calmer now  He does not want clothes on  Will not let staff take blood pressure  Arm immobilizer off   Paced beats on monitor  Sat on  Room air 95

## 2016-08-06 NOTE — Progress Notes (Signed)
1135 To OR via bed.

## 2016-08-06 NOTE — Progress Notes (Signed)
Up to bsc  Pt will not stay in bed to use urinal  CRNA GIVE versed earlier and haldol 2.5mg  given as well

## 2016-08-06 NOTE — Progress Notes (Signed)
Called by central telemetry regarding possible changes in patient's ECG showing complete heart block, which is a change from previous assessment of Type 2 second degree heart block.  STAT EKG obtained per protocol, which shows Type 2 Second degree heart block 2:1; similar to previous EKGs.  Patient remains asymptomatic with HR in low to mid 30s, VSS, no complaints of pain or distress. Will continue to monitor patient closely.

## 2016-08-06 NOTE — Op Note (Signed)
[facility]   08/05/2016 - 08/06/2016                     1:38 PM  PATIENT:  James Dickerson    PRE-OPERATIVE DIAGNOSIS:  type two second AVB  POST-OPERATIVE DIAGNOSIS:  Same  PROCEDURE:  INSERTION PACEMAKER  SURGEON:  Isaias Cowman, MD    ANESTHESIA:     PREOPERATIVE INDICATIONS:  James Dickerson is a  76 y.o. male with a diagnosis of type two second AVB who failed conservative measures and elected for surgical management.    The risks benefits and alternatives were discussed with the patient preoperatively including but not limited to the risks of infection, bleeding, cardiopulmonary complications, the need for revision surgery, among others, and the patient was willing to proceed.   OPERATIVE PROCEDURE: Left pectoral region was prepped and draped in usual sterile manner. Anesthesia was obtained with 1% lidocaine locally. A 6 cm incision was performed a left pectoral region. Pacemaker pocket was generated by electrocautery and blunt dissection. Access was obtained to left subclavian vein by fine needle aspiration. MRI compatible leads were positioned to right ventricular apical septum and right atrial appendage under fluoroscopic guidance. After proper thresholds were obtained the leads were sutured in place. The leads were connected to a MRI compatible dual-chamber rate responsive pacemaker generator (Medtronic [id number]). Pacemaker pocket was irrigated with gentamicin solution. The pacemaker generator was positioned into the pocket and the pocket was closed with 2-0 and 4-0 Vicryl, respectively. Steri-Strips and pressure dressing were applied.

## 2016-08-06 NOTE — Anesthesia Preprocedure Evaluation (Signed)
Anesthesia Evaluation  Patient identified by MRN, date of birth, ID band Patient awake    Reviewed: Allergy & Precautions, H&P , NPO status , Patient's Chart, lab work & pertinent test results  History of Anesthesia Complications Negative for: history of anesthetic complications  Airway Mallampati: III  TM Distance: >3 FB Neck ROM: full    Dental  (+) Poor Dentition, Chipped, Missing   Pulmonary Current Smoker,    Pulmonary exam normal breath sounds clear to auscultation       Cardiovascular Exercise Tolerance: Good hypertension, (-) Past MI Normal cardiovascular exam+ dysrhythmias  Rhythm:regular Rate:Normal     Neuro/Psych PSYCHIATRIC DISORDERS Depression negative neurological ROS     GI/Hepatic Neg liver ROS, GERD  Controlled,  Endo/Other  negative endocrine ROS  Renal/GU Renal diseasenegative Renal ROS  negative genitourinary   Musculoskeletal   Abdominal   Peds  Hematology negative hematology ROS (+)   Anesthesia Other Findings Past Medical History: No date: Back pain No date: Cancer ([place])     Comment: prostate No date: Cardiomegaly 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 No date: Depressive disorder No date: GERD (gastroesophageal reflux disease) No date: High cholesterol No date: History of prostate cancer No date: Hypertension No date: Thyroid nodule  Past Surgical History: 1971: APPENDECTOMY  BMI    Body Mass Index:  20.05 kg/m      Reproductive/Obstetrics negative OB ROS                             Anesthesia Physical Anesthesia Plan  ASA: IV  Anesthesia Plan: General   Post-op Pain Management:    Induction:   Airway Management Planned:   Additional Equipment:   Intra-op Plan:   Post-operative Plan:   Informed Consent: I have reviewed the patients History and Physical, chart, labs and discussed the procedure including the risks, benefits and alternatives  for the proposed anesthesia with the patient or authorized representative who has indicated his/her understanding and acceptance.   Dental Advisory Given  Plan Discussed with: Anesthesiologist, CRNA and Surgeon  Anesthesia Plan Comments:         Anesthesia Quick Evaluation

## 2016-08-06 NOTE — Progress Notes (Signed)
Pt is confused and when touched combative   Kicking staff

## 2016-08-06 NOTE — Progress Notes (Signed)
Dr piscitello called that pt calmer but will not let staff touch me or get blood pressure   Sat on room iar 96 heart rate 60 dual paced  Dr aware of situation and has been treated with haldol and is hemodynamically stable  To ccu via bed

## 2016-08-06 NOTE — Progress Notes (Signed)
Dr Wille Glaser in to see pt  Second dose of haldol given    Pt back in bed    Very combative

## 2016-08-06 NOTE — Progress Notes (Signed)
Pt very combative  Trying to get OOB  Will not let staff touch him  Wanting to get up  Dr Josefa Half saw pt in this condition

## 2016-08-06 NOTE — Progress Notes (Signed)
Patient came to icu with no sling to left arm. Dr Josefa Half aware that patient refuses sling.

## 2016-08-07 MED ORDER — CEFAZOLIN IN D5W 1 GM/50ML IV SOLN
1.0000 g | Freq: Four times a day (QID) | INTRAVENOUS | Status: DC
  Filled 2016-08-07 (×3): qty 50

## 2016-08-07 MED ORDER — SODIUM CHLORIDE 0.9 % IR SOLN
80.0000 mg | Status: DC
  Filled 2016-08-07: qty 2

## 2016-08-07 MED ORDER — CEPHALEXIN 250 MG PO CAPS
250.0000 mg | ORAL_CAPSULE | Freq: Four times a day (QID) | ORAL | Status: DC
  Administered 2016-08-07 – 2016-08-08 (×6): 250 mg via ORAL
  Filled 2016-08-07 (×5): qty 1

## 2016-08-07 MED ORDER — LORAZEPAM 2 MG/ML IJ SOLN: 2.0000 mg | Freq: Four times a day (QID) | INTRAMUSCULAR | Status: DC | PRN

## 2016-08-07 MED ORDER — LORAZEPAM 2 MG/ML IJ SOLN
INTRAMUSCULAR | Status: AC
  Administered 2016-08-07: 04:00:00
  Filled 2016-08-07: qty 1

## 2016-08-07 MED ORDER — CEFAZOLIN SODIUM-DEXTROSE 2-3 GM-% IV SOLR
2.0000 g | INTRAVENOUS | Status: DC
  Filled 2016-08-07: qty 50

## 2016-08-07 MED ORDER — CEFAZOLIN SODIUM-DEXTROSE 2-4 GM/100ML-% IV SOLN: 2.0000 g | INTRAVENOUS | Status: DC

## 2016-08-07 MED ORDER — ONDANSETRON HCL 4 MG/2ML IJ SOLN: 4.0000 mg | Freq: Four times a day (QID) | INTRAMUSCULAR | Status: DC | PRN

## 2016-08-07 MED ORDER — SODIUM CHLORIDE 0.9 % IV SOLN: INTRAVENOUS | Status: DC

## 2016-08-07 MED ORDER — ACETAMINOPHEN 325 MG PO TABS: 325.0000 mg | ORAL_TABLET | ORAL | Status: DC | PRN

## 2016-08-07 MED FILL — Medication: Qty: 1 | Status: AC

## 2016-08-07 NOTE — Care Management Important Message (Signed)
Important Message  Patient Details  Name: James Dickerson MRN: [id number] Date of Birth: 05-Jan-1941   Medicare Important Message Given:  Yes    Katrina Stack, RN 08/07/2016, 2:58 PM

## 2016-08-07 NOTE — Progress Notes (Signed)
Patient woke up over the last half hour.  Stood to urinate.  Knows his name and birthday.  Knows he is in hospital and why.  Now eating dinner.

## 2016-08-07 NOTE — Progress Notes (Signed)
[facility]  SUBJECTIVE: I don't have chest pain   Vitals:   08/07/16 0400 08/07/16 0500 08/07/16 0600 08/07/16 0700  BP: 130/62 136/70 (!) 157/80 (!) 140/95  Pulse: 80 69 73 91  Resp: 17 15 12 18   Temp: 98.2 F (36.8 C)   98 F (36.7 C)  TempSrc: Oral     SpO2: 95% 91% 92% 91%  Weight:      Height:         Intake/Output Summary (Last 24 hours) at 08/07/16 0749 Last data filed at 08/06/16 1400  Gross per 24 hour  Intake              900 ml  Output               10 ml  Net              890 ml      PHYSICAL EXAM  General: Well developed, well nourished, in no acute distress HEENT:  Normocephalic and atramatic Neck:  No JVD.  Lungs: Clear bilaterally to auscultation and percussion. Heart: HRRR . Normal S1 and S2 without gallops or murmurs.  Abdomen: Bowel sounds are positive, abdomen soft and non-tender  Msk:  Back normal, normal gait. Normal strength and tone for age. Extremities: No clubbing, cyanosis or edema.   Neuro: Alert and oriented X 3. Psych:  Good affect, responds appropriately   LABS: Basic Metabolic Panel:  Recent Labs  08/05/16 1250 08/06/16 0551  NA 138 135  K 4.4 3.8  CL 105 106  CO2 28 24  GLUCOSE 95 126*  BUN 20 18  CREATININE 1.13 0.90  CALCIUM 9.3 8.9  MG 2.0  --    Liver Function Tests: No results for input(s): AST, ALT, ALKPHOS, BILITOT, PROT, ALBUMIN in the last 72 hours. No results for input(s): LIPASE, AMYLASE in the last 72 hours. CBC:  Recent Labs  08/05/16 1250 08/06/16 0551  WBC 4.7 4.4  NEUTROABS 2.4  --   HGB 14.4 13.7  HCT 43.9 40.7  MCV 94.2 93.0  PLT 156 148*   Cardiac Enzymes:  Recent Labs  08/05/16 1715 08/05/16 2314 08/06/16 0551  TROPONINI <0.03 <0.03 <0.03   BNP: Invalid input(s): POCBNP D-Dimer: No results for input(s): DDIMER in the last 72 hours. Hemoglobin A1C: No results for input(s): HGBA1C in the last 72 hours. Fasting Lipid Panel: No results for input(s): CHOL, HDL, LDLCALC,  TRIG, CHOLHDL, LDLDIRECT in the last 72 hours. Thyroid Function Tests: No results for input(s): TSH, T4TOTAL, T3FREE, THYROIDAB in the last 72 hours.  Invalid input(s): FREET3 Anemia Panel: No results for input(s): VITAMINB12, FOLATE, FERRITIN, TIBC, IRON, RETICCTPCT in the last 72 hours.  Dg Chest Portable 1 View  Result Date: 08/05/2016 CLINICAL DATA:  Weakness and bradycardia.  Evaluate for CHF. EXAM: PORTABLE CHEST 1 VIEW COMPARISON:  08/09/2011 FINDINGS: The heart size and mediastinal contours are within normal limits. Both lungs are clear. No pleural effusion or edema. The visualized skeletal structures are unremarkable. IMPRESSION: No evidence for CHF. Electronically Signed   By: Kerby Moors M.D.   On: 08/05/2016 13:06   Dg C-arm 61-120 Min-no Report  Result Date: 08/06/2016 Fluoroscopy was utilized by the requesting physician.  No radiographic interpretation.     Echo  LV 65%.  TELEMETRY: AV sequential pacing:  ASSESSMENT AND PLAN:  Active Problems:   Bradycardia    1. Type II second degree AV block, status post dual-chamber pacemaker  Recommendations  1. Keflex  250 mg 4 times a day for 7 days 2. No further cardiac diagnostics at this time  Signed off for now, please call if any questions   James Cowman, MD, PhD, [facility] 08/07/2016 7:49 AM

## 2016-08-07 NOTE — Progress Notes (Signed)
James Dickerson at [place] NAME: James Dickerson    MR#:  [id number]  DATE OF BIRTH:  [birth year]  SUBJECTIVE:  Remains confused and agitated intermittently. Does not want to wear sling.  REVIEW OF SYSTEMS:   Review of Systems  Unable to perform ROS: Mental status change   Tolerating Diet:not much Tolerating PT: pending  DRUG ALLERGIES:  No Known Allergies  VITALS:  Blood pressure (!) 135/94, pulse 82, temperature 98 F (36.7 C), resp. rate 16, height 5\' 8"  (1.727 m), weight 59.8 kg (131 lb 13.4 oz), SpO2 (!) 89 %.  PHYSICAL EXAMINATION:   Physical Exam  GENERAL:  76 y.o.-year-old patient lying in the bed with no acute distress.  EYES: Pupils equal, round, reactive to light and accommodation. No scleral icterus. Extraocular muscles intact.  HEENT: Head atraumatic, normocephalic. Oropharynx and nasopharynx clear.  NECK:  Supple, no jugular venous distention. No thyroid enlargement, no tenderness.  LUNGS: Normal breath sounds bilaterally, no wheezing, rales, rhonchi. No use of accessory muscles of respiration. pacemakr+ CARDIOVASCULAR: S1, S2 normal. No murmurs, rubs, or gallops.  ABDOMEN: Soft, nontender, nondistended. Bowel sounds present. No organomegaly or mass.  EXTREMITIES: No cyanosis, clubbing or edema b/l.    NEUROLOGIC: Cranial nerves II through XII are intact. No focal Motor or sensory deficits b/l.   PSYCHIATRIC:  patient is alert and oriented x 3.  SKIN: No obvious rash, lesion, or ulcer.   LABORATORY PANEL:  CBC  Recent Labs Lab 08/06/16 0551  WBC 4.4  HGB 13.7  HCT 40.7  PLT 148*    Chemistries   Recent Labs Lab 08/05/16 1250 08/06/16 0551  NA 138 135  K 4.4 3.8  CL 105 106  CO2 28 24  GLUCOSE 95 126*  BUN 20 18  CREATININE 1.13 0.90  CALCIUM 9.3 8.9  MG 2.0  --    Cardiac Enzymes  Recent Labs Lab 08/06/16 0551  TROPONINI <0.03   RADIOLOGY:  Dg C-arm 61-120 Min-no Report  Result Date:  08/06/2016 Fluoroscopy was utilized by the requesting physician.  No radiographic interpretation.   ASSESSMENT AND PLAN:  James Dickerson a 76 y.o. malewith a known history of Hypertension, hyperlipidemia, GERD, history of prostate cancer status post radiation presents to the hospital from his primary care physician's office secondary to bradycardia.  #1 second-degree heart block-minimally symptomatic at this time. Hemodynamically stable. -CardiologyConsult with Dr. Lorinda Creed appreciated. Patient is status post pacemaker placement on march 16th -Echo shows EF of 65%-Troponin is negative at this time.  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  #3 hypertension-not on any blood pressure medications at home. IV hydralazine when necessary at this time. Monitor closely with his bradycardia  #4 DVT prophylaxis lovenox  #5 COnfusion/agiation with?dementia dter says pt does not drink ETOH Prn haldol. AVOID BENZODIAZEPINE  Move to 2A Spoke with dter Doroteo Bradford [phone]   Case discussed with Care Management/Social Worker. Management plans discussed with the patient, family and they are in agreement.  CODE STATUS: Full  DVT Prophylaxis: Lovenox  TOTAL TIME TAKING CARE OF THIS PATIENT: 40 minutes.  >50% time spent on counselling and coordination of care  POSSIBLE D/C IN 1 DAYS, DEPENDING ON CLINICAL CONDITION.  Note: This dictation was prepared with Dragon dictation along with smaller phrase technology. Any transcriptional errors that result from this process are unintentional.  [name] M.D on 08/07/2016 at 1:09 PM  Between 7am to 6pm - Pager - [phone]  After 6pm go to [email or web address] - password  [facility]  Office  [phone]  CC: Primary care physician; Lorelee Market, MD

## 2016-08-07 NOTE — Progress Notes (Signed)
Patient being tx to floor, nurse is [place]. Called report room 235. Patient has been irritable and agitated at times. Re-directing and family at bedside has helped. Patient has refused arm sling and chest xray. Dr Josefa Half aware. Patient has tolerated some apple sauce. He has been incontinent.

## 2016-08-08 MED ORDER — CEPHALEXIN 250 MG PO CAPS: 250.0000 mg | ORAL_CAPSULE | Freq: Four times a day (QID) | ORAL | 0 refills | Status: DC

## 2016-08-08 MED ORDER — OXYCODONE HCL 5 MG PO TABS: 5.0000 mg | ORAL_TABLET | Freq: Once | ORAL | 0 refills | Status: DC | PRN

## 2016-08-08 NOTE — Discharge Summary (Addendum)
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 Back pain   . Cancer [facility])    prostate  . Cardiomegaly   . Cystic kidney disease   . Depressive disorder   . GERD (gastroesophageal reflux disease)   . High cholesterol   . History of prostate cancer   . Hypertension   . Thyroid nodule     HOSPITAL COURSE:  James Dickerson a 76 y.o. malewith a known history of Hypertension, hyperlipidemia, GERD, history of prostate cancer status post radiation presents to the hospital from his primary care physician's office secondary to bradycardia.  #1 second-degree heart block-minimally symptomatic at this time. Hemodynamically stable. -CardiologyConsult with Dr. Lorinda Creed appreciated. Patient is status post pacemaker placement on march 16th -Echo shows EF of 65%-Troponin is negative at this time. 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  #3 hypertension-not on any blood pressure medications at home. IV hydralazine when necessary at this time. Monitor closely with his bradycardia  #4 DVT prophylaxis lovenox  #5 COnfusion/agiation/mild acute delirium ?anesthesia -resovled  Spoke with dter James Dickerson [phone] Will d/c home with cardiology f/u next week  CONSULTS OBTAINED:  Treatment Team:  Isaias Cowman, MD Fritzi Mandes, MD  DRUG ALLERGIES:  No Known Allergies  DISCHARGE MEDICATIONS:   Current Discharge Medication List     START taking these medications   Details  cephALEXin (KEFLEX) 250 MG capsule Take 1 capsule (250 mg total) by mouth every 6 (six) hours. Qty: 20 capsule, Refills: 0    oxyCODONE (OXY IR/ROXICODONE) 5 MG immediate release tablet Take 1 tablet (5 mg total) by mouth once as needed for moderate pain. Qty: 15 tablet, Refills: 0      CONTINUE these medications which have NOT CHANGED   Details  albuterol (PROVENTIL HFA;VENTOLIN HFA) 108 (90 Base) MCG/ACT inhaler Inhale 2 puffs into the lungs every 6 (six) hours as needed for wheezing or shortness of breath.    budesonide-formoterol (SYMBICORT) 160-4.5 MCG/ACT inhaler Inhale 2 puffs into the lungs 2 (two) times daily.    donepezil (ARICEPT) 5 MG tablet Take 5 mg by mouth at bedtime.    simvastatin (ZOCOR) 20 MG tablet Take 20 mg by mouth daily.    tamsulosin (FLOMAX) 0.4 MG CAPS capsule Take 0.4 mg by mouth daily.    tiotropium (SPIRIVA) 18 MCG inhalation capsule Place 18 mcg into inhaler and inhale daily.      STOP taking these medications     amoxicillin-clarithromycin-lansoprazole (PREVPAC) combo pack         If you experience worsening of your admission symptoms, develop shortness of breath, life threatening emergency, suicidal or homicidal thoughts you must seek medical attention immediately by calling 911 or calling your MD immediately  if symptoms less severe.  You Must read complete instructions/literature along with all the possible adverse reactions/side effects for all the Medicines you take and that have been prescribed to you. Take any new Medicines after you have completely understood and accept all the possible  adverse reactions/side effects.   Please note  You were cared for by a hospitalist during your hospital stay. If you have any questions about your discharge medications or the care you received while you were in the hospital after you are discharged, you can call the unit and asked to speak with the hospitalist  on call if the hospitalist that took care of you is not available. Once you are discharged, your primary care physician will handle any further medical issues. Please note that NO REFILLS for any discharge medications will be authorized once you are discharged, as it is imperative that you return to your primary care physician (or establish a relationship with a primary care physician if you do not have one) for your aftercare needs so that they can reassess your need for medications and monitor your lab values. Today   SUBJECTIVE   No new complaints. No confusion  VITAL SIGNS:  Blood pressure 118/78, pulse 84, temperature 98.1 F (36.7 C), temperature source Oral, resp. rate 16, height 5\' 8"  (1.727 m), weight 59.8 kg (131 lb 13.4 oz), SpO2 96 %.  I/O:    Intake/Output Summary (Last 24 hours) at 08/08/16 1217 Last data filed at 08/08/16 1024  Gross per 24 hour  Intake              240 ml  Output              255 ml  Net              -15 ml    PHYSICAL EXAMINATION:  GENERAL:  76 y.o.-year-old patient lying in the bed with no acute distress.  EYES: Pupils equal, round, reactive to light and accommodation. No scleral icterus. Extraocular muscles intact.  HEENT: Head atraumatic, normocephalic. Oropharynx and nasopharynx clear.  NECK:  Supple, no jugular venous distention. No thyroid enlargement, no tenderness.  LUNGS: Normal breath sounds bilaterally, no wheezing, rales,rhonchi or crepitation. No use of accessory muscles of respiration. Left upper chest pacemaker+ CARDIOVASCULAR: S1, S2 normal. No murmurs, rubs, or gallops.  ABDOMEN: Soft, non-tender, non-distended. Bowel sounds present. No organomegaly or mass.  EXTREMITIES: No pedal edema, cyanosis, or clubbing.  NEUROLOGIC: Cranial nerves II through XII are intact. Muscle strength 5/5 in all extremities. Sensation intact. Gait not checked.  PSYCHIATRIC: The patient is alert and oriented x 3.  SKIN: No obvious rash, lesion, or ulcer.    DATA REVIEW:   CBC   Recent Labs Lab 08/06/16 0551  WBC 4.4  HGB 13.7  HCT 40.7  PLT 148*    Chemistries   Recent Labs Lab 08/05/16 1250 08/06/16 0551  NA 138 135  K 4.4 3.8  CL 105 106  CO2 28 24  GLUCOSE 95 126*  BUN 20 18  CREATININE 1.13 0.90  CALCIUM 9.3 8.9  MG 2.0  --     Microbiology Results   Recent Results (from the past 240 hour(s))  MRSA PCR Screening     Status: None   Collection Time: 08/05/16  5:09 PM  Result Value Ref Range Status   MRSA by PCR NEGATIVE NEGATIVE Final    Comment:        The GeneXpert MRSA Assay (FDA approved for NASAL specimens only), is one component of a comprehensive MRSA colonization surveillance program. It is not intended to diagnose MRSA infection nor to guide or monitor treatment for MRSA infections.     RADIOLOGY:  Dg C-arm 61-120 Min-no Report  Result Date: 08/06/2016 Fluoroscopy was  utilized by the requesting physician.  No radiographic interpretation.     Management plans discussed with the patient, family and they are in agreement.  CODE STATUS:     Code Status Orders        Start     Ordered   08/05/16 1645  Full code  Continuous     08/05/16 1644    Code Status History    Date Active Date Inactive Code Status Order ID Comments User Context   This patient has a current code status but no historical code status.      TOTAL TIME TAKING CARE OF THIS PATIENT: 40 minutes.    Alexxis Mackert M.D on 08/08/2016 at 12:17 PM  Between 7am to 6pm - Pager - [phone] After 6pm go to [email or web address] - password [email or web address] [place]  Office  [phone]  CC: Primary care physician; Lorelee Market, MD

## 2016-08-08 NOTE — Progress Notes (Signed)
Patient discharged per MD order and hospital protocol. Patient and his daughter verbalized understanding of discharge instructions, follow up appointments and medications. Patient given arm sling to wear at home. Patient also given educational handout on Pacemaker and aftercare. Patient escorted off the unit via staff.

## 2016-08-14 DIAGNOSIS — I441 Atrioventricular block, second degree: Secondary | ICD-10-CM | POA: Insufficient documentation

## 2016-08-14 DIAGNOSIS — I10 Essential (primary) hypertension: Secondary | ICD-10-CM | POA: Insufficient documentation

## 2016-12-01 ENCOUNTER — Emergency Department: Payer: Medicare Other

## 2016-12-01 ENCOUNTER — Emergency Department
Admission: EM | Admit: 2016-12-01 | Discharge: 2016-12-01 | Disposition: A | Discharge status: Home / Self Care | Payer: Medicare Other | Attending: Student in an Organized Health Care Education/Training Program | Admitting: Student in an Organized Health Care Education/Training Program

## 2016-12-01 ENCOUNTER — Encounter: Payer: Self-pay | Admitting: Emergency Medicine

## 2016-12-01 DIAGNOSIS — N189 Chronic kidney disease, unspecified: Secondary | ICD-10-CM | POA: Diagnosis not present

## 2016-12-01 DIAGNOSIS — S2242XA Multiple fractures of ribs, left side, initial encounter for closed fracture: Secondary | ICD-10-CM | POA: Diagnosis not present

## 2016-12-01 DIAGNOSIS — Y92410 Unspecified street and highway as the place of occurrence of the external cause: Secondary | ICD-10-CM | POA: Insufficient documentation

## 2016-12-01 DIAGNOSIS — F17200 Nicotine dependence, unspecified, uncomplicated: Secondary | ICD-10-CM | POA: Diagnosis not present

## 2016-12-01 DIAGNOSIS — Y999 Unspecified external cause status: Secondary | ICD-10-CM | POA: Insufficient documentation

## 2016-12-01 DIAGNOSIS — M25522 Pain in left elbow: Secondary | ICD-10-CM

## 2016-12-01 DIAGNOSIS — Y939 Activity, unspecified: Secondary | ICD-10-CM | POA: Insufficient documentation

## 2016-12-01 DIAGNOSIS — Z79899 Other long term (current) drug therapy: Secondary | ICD-10-CM | POA: Insufficient documentation

## 2016-12-01 DIAGNOSIS — I129 Hypertensive chronic kidney disease with stage 1 through stage 4 chronic kidney disease, or unspecified chronic kidney disease: Secondary | ICD-10-CM | POA: Diagnosis not present

## 2016-12-01 DIAGNOSIS — S29001A Unspecified injury of muscle and tendon of front wall of thorax, initial encounter: Secondary | ICD-10-CM | POA: Diagnosis present

## 2016-12-01 LAB — CBC WITH DIFFERENTIAL/PLATELET
Basophils Absolute: 0 10*3/uL (ref 0–0.1)
Basophils Relative: 1 %
EOS ABS: 0.1 10*3/uL (ref 0–0.7)
Eosinophils Relative: 1 %
HCT: 43.4 % (ref 40.0–52.0)
Hemoglobin: 14.7 g/dL (ref 13.0–18.0)
Lymphocytes Relative: 15 %
Lymphs Abs: 0.9 10*3/uL — ABNORMAL LOW (ref 1.0–3.6)
MCH: 31.6 pg (ref 26.0–34.0)
MCHC: 33.8 g/dL (ref 32.0–36.0)
MCV: 93.2 fL (ref 80.0–100.0)
Monocytes Absolute: 0.5 10*3/uL (ref 0.2–1.0)
Monocytes Relative: 7 %
Neutro Abs: 4.9 10*3/uL (ref 1.4–6.5)
Neutrophils Relative %: 76 %
PLATELETS: 180 10*3/uL (ref 150–440)
RBC: 4.65 MIL/uL (ref 4.40–5.90)
RDW: 14.6 % — ABNORMAL HIGH (ref 11.5–14.5)
WBC: 6.4 10*3/uL (ref 3.8–10.6)

## 2016-12-01 LAB — BASIC METABOLIC PANEL
Anion gap: 8 (ref 5–15)
BUN: 26 mg/dL — AB (ref 6–20)
CO2: 27 mmol/L (ref 22–32)
Calcium: 9.4 mg/dL (ref 8.9–10.3)
Chloride: 101 mmol/L (ref 101–111)
Creatinine, Ser: 1.06 mg/dL (ref 0.61–1.24)
Glucose, Bld: 97 mg/dL (ref 65–99)
POTASSIUM: 4.7 mmol/L (ref 3.5–5.1)
Sodium: 136 mmol/L (ref 135–145)

## 2016-12-01 LAB — TROPONIN I

## 2016-12-01 MED ORDER — NAPROXEN 500 MG PO TABS: 500.0000 mg | ORAL_TABLET | Freq: Two times a day (BID) | ORAL | 0 refills | Status: DC

## 2016-12-01 MED ORDER — HYDROCODONE-ACETAMINOPHEN 5-325 MG PO TABS: 2.0000 | ORAL_TABLET | Freq: Four times a day (QID) | ORAL | 0 refills | Status: DC | PRN

## 2016-12-01 MED ORDER — NAPROXEN 500 MG PO TABS
500.0000 mg | ORAL_TABLET | Freq: Two times a day (BID) | ORAL | Status: DC
  Administered 2016-12-01: 500 mg via ORAL
  Filled 2016-12-01: qty 1

## 2016-12-01 MED ORDER — BACLOFEN 10 MG PO TABS: 10.0000 mg | ORAL_TABLET | Freq: Two times a day (BID) | ORAL | 0 refills | Status: DC | PRN

## 2016-12-01 MED ORDER — DOXYCYCLINE HYCLATE 100 MG PO TABS
100.0000 mg | ORAL_TABLET | Freq: Once | ORAL | Status: AC
  Administered 2016-12-01: 100 mg via ORAL
  Filled 2016-12-01: qty 1

## 2016-12-01 MED ORDER — DOXYCYCLINE HYCLATE 50 MG PO CAPS: 100.0000 mg | ORAL_CAPSULE | Freq: Two times a day (BID) | ORAL | 0 refills | Status: AC

## 2016-12-01 MED ORDER — TETANUS-DIPHTH-ACELL PERTUSSIS 5-2.5-18.5 LF-MCG/0.5 IM SUSP
0.5000 mL | Freq: Once | INTRAMUSCULAR | Status: AC
  Administered 2016-12-01: 0.5 mL via INTRAMUSCULAR
  Filled 2016-12-01: qty 0.5

## 2016-12-01 MED ORDER — HYDROCODONE-ACETAMINOPHEN 5-325 MG PO TABS
1.0000 | ORAL_TABLET | Freq: Once | ORAL | Status: AC
  Administered 2016-12-01: 1 via ORAL
  Filled 2016-12-01: qty 1

## 2016-12-01 NOTE — ED Notes (Signed)
Pt ambulated in hallway, pt began on room air at 96%, during ambulation pt's oxygen remained at 95% room air. Pt ambulated with stand by assist and without difficulty once maneuvering to standing position, EDP made aware of this.

## 2016-12-01 NOTE — ED Notes (Signed)
Incentive spirometer given to pt with education. Pt using spirometer at this time without difficulty.
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
## 2017-03-09 ENCOUNTER — Emergency Department: Admission: EM | Admit: 2017-03-09 | Payer: No Typology Code available for payment source | Source: Home / Self Care

## 2017-03-09 DIAGNOSIS — E785 Hyperlipidemia, unspecified: Secondary | ICD-10-CM | POA: Insufficient documentation

## 2017-07-10 ENCOUNTER — Observation Stay
Admission: EM | Admit: 2017-07-10 | Discharge: 2017-07-11 | Disposition: A | Discharge status: Home / Self Care | Payer: Medicare Other | Attending: Internal Medicine | Admitting: Internal Medicine

## 2017-07-10 ENCOUNTER — Encounter: Payer: Self-pay | Admitting: Emergency Medicine

## 2017-07-10 ENCOUNTER — Other Ambulatory Visit: Payer: Self-pay

## 2017-07-10 ENCOUNTER — Emergency Department: Payer: Medicare Other

## 2017-07-10 DIAGNOSIS — J189 Pneumonia, unspecified organism: Secondary | ICD-10-CM

## 2017-07-10 DIAGNOSIS — I700 Atherosclerosis of aorta: Secondary | ICD-10-CM | POA: Diagnosis not present

## 2017-07-10 DIAGNOSIS — I10 Essential (primary) hypertension: Secondary | ICD-10-CM | POA: Diagnosis not present

## 2017-07-10 DIAGNOSIS — E7800 Pure hypercholesterolemia, unspecified: Secondary | ICD-10-CM | POA: Diagnosis not present

## 2017-07-10 DIAGNOSIS — J9690 Respiratory failure, unspecified, unspecified whether with hypoxia or hypercapnia: Secondary | ICD-10-CM | POA: Diagnosis present

## 2017-07-10 DIAGNOSIS — J439 Emphysema, unspecified: Principal | ICD-10-CM | POA: Insufficient documentation

## 2017-07-10 DIAGNOSIS — Z8546 Personal history of malignant neoplasm of prostate: Secondary | ICD-10-CM | POA: Insufficient documentation

## 2017-07-10 DIAGNOSIS — E785 Hyperlipidemia, unspecified: Secondary | ICD-10-CM | POA: Diagnosis not present

## 2017-07-10 DIAGNOSIS — J441 Chronic obstructive pulmonary disease with (acute) exacerbation: Secondary | ICD-10-CM | POA: Diagnosis present

## 2017-07-10 DIAGNOSIS — F0390 Unspecified dementia without behavioral disturbance: Secondary | ICD-10-CM | POA: Insufficient documentation

## 2017-07-10 DIAGNOSIS — J101 Influenza due to other identified influenza virus with other respiratory manifestations: Secondary | ICD-10-CM

## 2017-07-10 DIAGNOSIS — Z950 Presence of cardiac pacemaker: Secondary | ICD-10-CM | POA: Diagnosis not present

## 2017-07-10 DIAGNOSIS — R0902 Hypoxemia: Secondary | ICD-10-CM

## 2017-07-10 DIAGNOSIS — I442 Atrioventricular block, complete: Secondary | ICD-10-CM | POA: Insufficient documentation

## 2017-07-10 DIAGNOSIS — K219 Gastro-esophageal reflux disease without esophagitis: Secondary | ICD-10-CM | POA: Insufficient documentation

## 2017-07-10 DIAGNOSIS — J9600 Acute respiratory failure, unspecified whether with hypoxia or hypercapnia: Secondary | ICD-10-CM

## 2017-07-10 DIAGNOSIS — Z79899 Other long term (current) drug therapy: Secondary | ICD-10-CM | POA: Insufficient documentation

## 2017-07-10 DIAGNOSIS — J9601 Acute respiratory failure with hypoxia: Secondary | ICD-10-CM | POA: Diagnosis not present

## 2017-07-10 DIAGNOSIS — N400 Enlarged prostate without lower urinary tract symptoms: Secondary | ICD-10-CM | POA: Insufficient documentation

## 2017-07-10 DIAGNOSIS — F329 Major depressive disorder, single episode, unspecified: Secondary | ICD-10-CM | POA: Insufficient documentation

## 2017-07-10 DIAGNOSIS — J449 Chronic obstructive pulmonary disease, unspecified: Secondary | ICD-10-CM

## 2017-07-10 LAB — BASIC METABOLIC PANEL WITH GFR
Anion gap: 13 (ref 5–15)
BUN: 21 mg/dL — ABNORMAL HIGH (ref 6–20)
CO2: 21 mmol/L — ABNORMAL LOW (ref 22–32)
Calcium: 8.6 mg/dL — ABNORMAL LOW (ref 8.9–10.3)
Chloride: 101 mmol/L (ref 101–111)
Creatinine, Ser: 1.21 mg/dL (ref 0.61–1.24)
GFR calc Af Amer: >60 mL/min (ref >60)
GFR calc non Af Amer: 56 mL/min — ABNORMAL LOW (ref >60)
Glucose, Bld: 125 mg/dL — ABNORMAL HIGH (ref 65–99)
Potassium: 4 mmol/L (ref 3.5–5.1)
Sodium: 135 mmol/L (ref 135–145)

## 2017-07-10 LAB — CBC WITH DIFFERENTIAL/PLATELET
Basophils Absolute: 0 10*3/uL (ref 0–0.1)
Basophils Relative: 0 %
Eosinophils Absolute: 0 10*3/uL (ref 0–0.7)
Eosinophils Relative: 0 %
HEMATOCRIT: 51.8 % (ref 40.0–52.0)
HEMOGLOBIN: 16.6 g/dL (ref 13.0–18.0)
LYMPHS ABS: 1.9 10*3/uL (ref 1.0–3.6)
Lymphocytes Relative: 24 %
MCH: 30.4 pg (ref 26.0–34.0)
MCHC: 32 g/dL (ref 32.0–36.0)
MCV: 94.9 fL (ref 80.0–100.0)
MONOS PCT: 11 %
Monocytes Absolute: 0.9 10*3/uL (ref 0.2–1.0)
NEUTROS ABS: 5.2 10*3/uL (ref 1.4–6.5)
Neutrophils Relative %: 65 %
Platelets: 191 10*3/uL (ref 150–440)
RBC: 5.45 MIL/uL (ref 4.40–5.90)
RDW: 13.5 % (ref 11.5–14.5)
WBC: 8 10*3/uL (ref 3.8–10.6)

## 2017-07-10 LAB — TROPONIN I: Troponin I: <0.03 ng/mL (ref <0.03)

## 2017-07-10 LAB — INFLUENZA PANEL BY PCR (TYPE A & B)
INFLBPCR: NEGATIVE
Influenza A By PCR: POSITIVE — AB

## 2017-07-10 MED ORDER — MOMETASONE FURO-FORMOTEROL FUM 200-5 MCG/ACT IN AERO
2.0000 | INHALATION_SPRAY | Freq: Two times a day (BID) | RESPIRATORY_TRACT | Status: DC
  Administered 2017-07-10 – 2017-07-11 (×2): 2 via RESPIRATORY_TRACT
  Filled 2017-07-10: qty 8.8

## 2017-07-10 MED ORDER — SODIUM CHLORIDE 0.9 % IV SOLN
500.0000 mg | INTRAVENOUS | Status: DC
  Administered 2017-07-10: 500 mg via INTRAVENOUS
  Filled 2017-07-10 (×2): qty 500

## 2017-07-10 MED ORDER — INFLUENZA VAC SPLIT HIGH-DOSE 0.5 ML IM SUSY
0.5000 mL | PREFILLED_SYRINGE | INTRAMUSCULAR | Status: DC
  Filled 2017-07-10: qty 0.5

## 2017-07-10 MED ORDER — GUAIFENESIN ER 600 MG PO TB12
1200.0000 mg | ORAL_TABLET | Freq: Two times a day (BID) | ORAL | Status: DC
  Administered 2017-07-10 – 2017-07-11 (×2): 1200 mg via ORAL
  Filled 2017-07-10 (×2): qty 2

## 2017-07-10 MED ORDER — BISACODYL 10 MG RE SUPP: 10.0000 mg | Freq: Every day | RECTAL | Status: DC | PRN

## 2017-07-10 MED ORDER — TIOTROPIUM BROMIDE MONOHYDRATE 18 MCG IN CAPS
18.0000 ug | ORAL_CAPSULE | Freq: Every day | RESPIRATORY_TRACT | Status: DC
  Administered 2017-07-11: 18 ug via RESPIRATORY_TRACT
  Filled 2017-07-10: qty 5

## 2017-07-10 MED ORDER — ACETAMINOPHEN 650 MG RE SUPP: 650.0000 mg | Freq: Four times a day (QID) | RECTAL | Status: DC | PRN

## 2017-07-10 MED ORDER — DONEPEZIL HCL 5 MG PO TABS
5.0000 mg | ORAL_TABLET | Freq: Every day | ORAL | Status: DC
  Administered 2017-07-10: 5 mg via ORAL
  Filled 2017-07-10 (×2): qty 1

## 2017-07-10 MED ORDER — ALBUTEROL SULFATE (2.5 MG/3ML) 0.083% IN NEBU: 3.0000 mL | INHALATION_SOLUTION | Freq: Four times a day (QID) | RESPIRATORY_TRACT | Status: DC | PRN

## 2017-07-10 MED ORDER — METHYLPREDNISOLONE SODIUM SUCC 125 MG IJ SOLR: 125.0000 mg | Freq: Once | INTRAMUSCULAR | Status: DC

## 2017-07-10 MED ORDER — IPRATROPIUM-ALBUTEROL 0.5-2.5 (3) MG/3ML IN SOLN
3.0000 mL | Freq: Four times a day (QID) | RESPIRATORY_TRACT | Status: DC
  Administered 2017-07-10 – 2017-07-11 (×3): 3 mL via RESPIRATORY_TRACT
  Filled 2017-07-10 (×4): qty 3

## 2017-07-10 MED ORDER — HEPARIN SODIUM (PORCINE) 5000 UNIT/ML IJ SOLN
5000.0000 [IU] | Freq: Three times a day (TID) | INTRAMUSCULAR | Status: DC
  Administered 2017-07-10 – 2017-07-11 (×3): 5000 [IU] via SUBCUTANEOUS
  Filled 2017-07-10 (×3): qty 1

## 2017-07-10 MED ORDER — ONDANSETRON HCL 4 MG/2ML IJ SOLN
4.0000 mg | Freq: Four times a day (QID) | INTRAMUSCULAR | Status: DC | PRN
Start: 2017-07-10 — End: 2017-07-11

## 2017-07-10 MED ORDER — OSELTAMIVIR PHOSPHATE 75 MG PO CAPS
75.0000 mg | ORAL_CAPSULE | Freq: Two times a day (BID) | ORAL | Status: DC
  Administered 2017-07-10 – 2017-07-11 (×2): 75 mg via ORAL
  Filled 2017-07-10 (×3): qty 1

## 2017-07-10 MED ORDER — ACETAMINOPHEN 325 MG PO TABS: 650.0000 mg | ORAL_TABLET | Freq: Four times a day (QID) | ORAL | Status: DC | PRN

## 2017-07-10 MED ORDER — TAMSULOSIN HCL 0.4 MG PO CAPS
0.4000 mg | ORAL_CAPSULE | Freq: Every day | ORAL | Status: DC
  Administered 2017-07-10 – 2017-07-11 (×2): 0.4 mg via ORAL
  Filled 2017-07-10 (×2): qty 1

## 2017-07-10 MED ORDER — SIMVASTATIN 20 MG PO TABS
20.0000 mg | ORAL_TABLET | Freq: Every day | ORAL | Status: DC
  Administered 2017-07-10: 20 mg via ORAL
  Filled 2017-07-10 (×2): qty 1

## 2017-07-10 MED ORDER — SODIUM CHLORIDE 0.9 % IV SOLN
INTRAVENOUS | Status: DC
  Administered 2017-07-10: 18:00:00 via INTRAVENOUS

## 2017-07-10 MED ORDER — OSELTAMIVIR PHOSPHATE 75 MG PO CAPS
75.0000 mg | ORAL_CAPSULE | Freq: Once | ORAL | Status: AC
  Administered 2017-07-10: 75 mg via ORAL
  Filled 2017-07-10: qty 1

## 2017-07-10 MED ORDER — ONDANSETRON HCL 4 MG PO TABS: 4.0000 mg | ORAL_TABLET | Freq: Four times a day (QID) | ORAL | Status: DC | PRN

## 2017-07-10 MED ORDER — IOPAMIDOL (ISOVUE-370) INJECTION 76%
75.0000 mL | Freq: Once | INTRAVENOUS | Status: AC | PRN
  Administered 2017-07-10: 75 mL via INTRAVENOUS

## 2017-07-10 MED ORDER — IPRATROPIUM-ALBUTEROL 0.5-2.5 (3) MG/3ML IN SOLN
3.0000 mL | Freq: Once | RESPIRATORY_TRACT | Status: AC
  Administered 2017-07-10: 3 mL via RESPIRATORY_TRACT

## 2017-07-10 MED ORDER — DOCUSATE SODIUM 100 MG PO CAPS
100.0000 mg | ORAL_CAPSULE | Freq: Two times a day (BID) | ORAL | Status: DC
Start: 2017-07-10 — End: 2017-07-11
  Administered 2017-07-10 – 2017-07-11 (×2): 100 mg via ORAL
  Filled 2017-07-10 (×2): qty 1

## 2017-07-10 MED ORDER — ORAL CARE MOUTH RINSE
15.0000 mL | Freq: Two times a day (BID) | OROMUCOSAL | Status: DC
  Administered 2017-07-10: 15 mL via OROMUCOSAL

## 2017-07-10 MED ORDER — PNEUMOCOCCAL VAC POLYVALENT 25 MCG/0.5ML IJ INJ: 0.5000 mL | INJECTION | INTRAMUSCULAR | Status: DC

## 2017-07-10 NOTE — ED Notes (Signed)
Dr. Lord at bedside.  

## 2017-07-10 NOTE — ED Provider Notes (Signed)
[facility] Emergency Department Provider Note ____________________________________________   I have reviewed the triage vital signs and the triage nursing note.  HISTORY  Chief Complaint Cough and Shortness of Breath   Historian Patient  HPI James Dickerson is a 77 y.o. male with a history of COPD, sounds like is not taking inhalers at home, no home O2 requirement, presents for several days of worsening dyspnea and shortness of breath.  Is worse with exertion.  No chest pain.  Mild cough productive of white sputum.  No fever.  No hemoptysis.  No abdominal pain.   Past Medical History:  Diagnosis Date  . Back pain   . Cancer [facility])    prostate  . Cardiomegaly   . Cystic kidney disease   . Depressive disorder   . GERD (gastroesophageal reflux disease)   . High cholesterol   . History of prostate cancer   . Hypertension   . Thyroid nodule     Patient Active Problem List   Diagnosis Date Noted  . Bradycardia 08/05/2016    Past Surgical History:  Procedure Laterality Date  . APPENDECTOMY  1971  . PACEMAKER INSERTION N/A 08/06/2016   Procedure: INSERTION PACEMAKER;  Surgeon: Isaias Cowman, MD;  Location: [facility] ORS;  Service: Cardiovascular;  Laterality: N/A;    Prior to Admission medications   Medication Sig Start Date End Date Taking? Authorizing Provider  albuterol (PROVENTIL HFA;VENTOLIN HFA) 108 (90 Base) MCG/ACT inhaler Inhale 2 puffs into the lungs every 6 (six) hours as needed for wheezing or shortness of breath.   Yes [provider]  budesonide-formoterol (SYMBICORT) 160-4.5 MCG/ACT inhaler Inhale 2 puffs into the lungs 2 (two) times daily.   Yes [provider]  donepezil (ARICEPT) 5 MG tablet Take 5 mg by mouth at bedtime.   Yes [provider]  simvastatin (ZOCOR) 20 MG tablet Take 20 mg by mouth daily.   Yes [provider]  tamsulosin (FLOMAX) 0.4 MG CAPS capsule Take 0.4 mg by mouth daily.   Yes  [provider]  tiotropium (SPIRIVA) 18 MCG inhalation capsule Place 18 mcg into inhaler and inhale daily.   Yes [provider]  baclofen (LIORESAL) 10 MG tablet Take 1 tablet (10 mg total) by mouth 2 (two) times daily as needed for muscle spasms. Patient not taking: Reported on 07/10/2017 12/01/16 12/01/17  Merlyn Lot, MD  HYDROcodone-acetaminophen [facility]) 5-325 MG tablet Take 2 tablets by mouth every 6 (six) hours as needed for severe pain. Patient not taking: Reported on 07/10/2017 12/01/16   Merlyn Lot, MD  naproxen (NAPROSYN) 500 MG tablet Take 1 tablet (500 mg total) by mouth 2 (two) times daily with a meal. Patient not taking: Reported on 07/10/2017 12/01/16 12/01/17  Merlyn Lot, MD  oxyCODONE (OXY IR/ROXICODONE) 5 MG immediate release tablet Take 1 tablet (5 mg total) by mouth once as needed for moderate pain. Patient not taking: Reported on 07/10/2017 08/08/16   Fritzi Mandes, MD    No Known Allergies  No family history on file.  Social History Social History   Tobacco Use  . Smoking status: Current Every Day Smoker  . Smokeless tobacco: Never Used  Substance Use Topics  . Alcohol use: No  . Drug use: No    Review of Systems  Constitutional: Negative for fever. Eyes: Negative for visual changes. ENT: Negative for sore throat. Cardiovascular: Negative for chest pain. Respiratory: Positive as per HPI for shortness of breath. Gastrointestinal: Negative for abdominal pain, vomiting and diarrhea.  Genitourinary: Negative for dysuria. Musculoskeletal: Negative for back pain. Skin: Negative for rash. Neurological: Negative for headache.  ____________________________________________   PHYSICAL EXAM:  VITAL SIGNS: ED Triage Vitals  Enc Vitals Group     BP 07/10/17 1011 (!) 135/92     Pulse Rate 07/10/17 1011 (!) 116     Resp 07/10/17 1011 (!) 22     Temp 07/10/17 1011 98.2 F (36.8 C)     Temp Source 07/10/17 1011 Oral     SpO2  07/10/17 1011 90 %     Weight 07/10/17 1014 145 lb (65.8 kg)     Height --      Head Circumference --      Peak Flow --      Pain Score --      Pain Loc --      Pain Edu? --      Excl. in [place]? --      Constitutional: Alert and oriented. Well appearing and mild respiratory distress when he speaks. HEENT   Head: Normocephalic and atraumatic.      Eyes: Conjunctivae are normal. Pupils equal and round.       Ears:         Nose: No congestion/rhinnorhea.   Mouth/Throat: Mucous membranes are moist.   Neck: No stridor. Cardiovascular/Chest: Tachycardic rate, regular rhythm.  No murmurs, rubs, or gallops. Respiratory: Speaking just short sentences without significant dyspnea.  No retractions.  Decreased breath sounds throughout with mild wheezing.  No rhonchi or rales. Gastrointestinal: Soft. No distention, no guarding, no rebound. Nontender.    Genitourinary/rectal:Deferred Musculoskeletal: Nontender with normal range of motion in all extremities. No joint effusions.  No lower extremity tenderness.  No edema. Neurologic:  Normal speech and language. No gross or focal neurologic deficits are appreciated. Skin:  Skin is warm, dry and intact. No rash noted. Psychiatric: Mood and affect are normal. Speech and behavior are normal. Patient exhibits appropriate insight and judgment.   ____________________________________________  LABS (pertinent positives/negatives) I, Lisa Roca, MD the attending physician have reviewed the labs noted below.  Labs Reviewed  INFLUENZA PANEL BY PCR (TYPE A & B) - Abnormal; Notable for the following components:      Result Value   Influenza A By PCR POSITIVE (*)    All other components within normal limits  BASIC METABOLIC PANEL - Abnormal; Notable for the following components:   CO2 21 (*)    Glucose, Bld 125 (*)    BUN 21 (*)    Calcium 8.6 (*)    GFR calc non Af Amer 56 (*)    All other components within normal limits  CBC WITH  DIFFERENTIAL/PLATELET  TROPONIN I    ____________________________________________    EKG I, Lisa Roca, MD, the attending physician have personally viewed and interpreted all ECGs.  116 bpm, suspect atrially sensed and ventricularly paced rhythm. ____________________________________________  RADIOLOGY All Xrays were viewed by me.  Imaging interpreted by Radiologist, and I, Lisa Roca, MD the attending physician have reviewed the radiologist interpretation noted below.  Chest x-ray: No focal pneumonia.  Pacer Radiologist report:IMPRESSION: Chronic COPD/emphysema pattern with hyperinflation.  Medial bibasilar scarring/atelectasis.  Thoracic aortic atherosclerosis  CT Chest for PE: IMPRESSION: 1. Bibasilar infiltrates and bilateral lower lobe bronchial wall thickening consistent with an infectious process/pneumonia. Recommend short-term follow-up to ensure resolution. The bronchial wall thickening narrows the left lower lobe bronchi, recommend attention on follow-up. 2. No pulmonary emboli. 3. 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 Recommend annual imaging followup by CTA or MRA. This recommendation follows 2010 ACCF/AHA/AATS/ACR/ASA/SCA/SCAI/SIR/STS/SVM Guidelines for the Diagnosis and Management of Patients with Thoracic Aortic Disease. Circulation.2010; 121: B559-R416 4. Coronary artery calcifications. Thoracic aortic calcifications. 5. Emphysema.  Aortic Atherosclerosis (ICD10-I70.0) and Emphysema (ICD10-J43.9). __________________________________________  PROCEDURES  Procedure(s) performed: None  Critical Care performed: None   ____________________________________________  ED COURSE / ASSESSMENT AND PLAN  Pertinent labs & imaging results that were available during my care of the patient were reviewed by me and considered in my medical decision making (see chart for details).  Patient has a history of COPD but no home O2  use.  Here 86% on room air.  91% on 4 L nasal cannula.  He does have decreased air movement, and some wheezing, however his profound hypoxia with relation to his clinical lung wheezing I feel like he needs further investigation for PE as a potential underlying cause.  Chest x-ray without pneumonia.  Started initiated treatment for COPD exacerbation.  Given the patient's hypoxia, he will need hospital admission.  Although is not really complaining of flulike symptoms, again given his severity of illness despite the amount of wheezing on exam, I think warrants checking this given that we are in the middle of flu season right now.  Positive for influenza A.  Started on Tamiflu  CT chest negative for PE.  I did print a result for the patient so that he has a copy in terms of following up for outpatient management of mild aneurysm that was found incidentally.  Bibasilar infiltrates, will leave discretion to hospitalist admitting whether not to place patient on antibiotics given known flu, and no fever and no elevated white blood cell count.  DIFFERENTIAL DIAGNOSIS: Differential includes, but is not limited to, viral syndrome, bronchitis including COPD exacerbation, pneumonia, reactive airway disease including asthma, CHF including exacerbation with or without pulmonary/interstitial edema, pneumothorax, ACS, thoracic trauma, and pulmonary embolism.  CONSULTATIONS:   Hospitalist for [place].   Patient / Family / Caregiver informed of clinical course, medical decision-making process, and agree with plan.  I spoke with daughter in [place] by phone.   ___________________________________________   FINAL CLINICAL IMPRESSION(S) / ED DIAGNOSES   Final diagnoses:  COPD exacerbation ([place])  Hypoxia  Influenza A      ___________________________________________        Note: This dictation was prepared with Dragon dictation. Any transcriptional errors that result from this process  are unintentional    Lisa Roca, MD 07/10/17 1501

## 2017-07-10 NOTE — H&P (Signed)
History and Physical    James Dickerson IDP:[id number] DOB: May 27, 1940 DOA: 07/10/2017  Referring physician: Dr. Reita Cliche  PCP: Lorelee Market, MD  Specialists: none  Chief Complaint: SOB and cough  HPI: James Dickerson is a 77 y.o. male has a past medical history significant for HTN, CHB s/p pacer, COPD, and prostate cancer who presents with 3-4 day hx of worsening SOB and cough. No fever. Denies CP. Some weakness. No N/V/D. In ER, pt noted to be hypoxic requiring O2. Influenza A positive. CT shows bibasilar pneumonia. He is now admitted  Review of Systems: The patient denies anorexia, fever, weight loss,, vision loss, decreased hearing, hoarseness, chest pain, syncope, peripheral edema, balance deficits, hemoptysis, abdominal pain, melena, hematochezia, severe indigestion/heartburn, hematuria, incontinence, genital sores, muscle weakness, suspicious skin lesions, transient blindness, difficulty walking, depression, unusual weight change, abnormal bleeding, enlarged lymph nodes, angioedema, and breast masses.   Past Medical History:  Diagnosis Date  . Back pain   . Cancer [facility])    prostate  . Cardiomegaly   . Cystic kidney disease   . Depressive disorder   . GERD (gastroesophageal reflux disease)   . High cholesterol   . History of prostate cancer   . Hypertension   . Thyroid nodule    Past Surgical History:  Procedure Laterality Date  . APPENDECTOMY  1971  . PACEMAKER INSERTION N/A 08/06/2016   Procedure: INSERTION PACEMAKER;  Surgeon: Isaias Cowman, MD;  Location: [facility] ORS;  Service: Cardiovascular;  Laterality: N/A;   Social History:  reports that he has been smoking.  he has never used smokeless tobacco. He reports that he does not drink alcohol or use drugs.  No Known Allergies  History reviewed. No pertinent family history.  Prior to Admission medications   Medication Sig Start Date End Date Taking? Authorizing Provider  albuterol (PROVENTIL HFA;VENTOLIN HFA) 108 (90  Base) MCG/ACT inhaler Inhale 2 puffs into the lungs every 6 (six) hours as needed for wheezing or shortness of breath.   Yes [provider]  budesonide-formoterol (SYMBICORT) 160-4.5 MCG/ACT inhaler Inhale 2 puffs into the lungs 2 (two) times daily.   Yes [provider]  donepezil (ARICEPT) 5 MG tablet Take 5 mg by mouth at bedtime.   Yes [provider]  simvastatin (ZOCOR) 20 MG tablet Take 20 mg by mouth daily.   Yes [provider]  tamsulosin (FLOMAX) 0.4 MG CAPS capsule Take 0.4 mg by mouth daily.   Yes [provider]  tiotropium (SPIRIVA) 18 MCG inhalation capsule Place 18 mcg into inhaler and inhale daily.   Yes [provider]  baclofen (LIORESAL) 10 MG tablet Take 1 tablet (10 mg total) by mouth 2 (two) times daily as needed for muscle spasms. Patient not taking: Reported on 07/10/2017 12/01/16 12/01/17  Merlyn Lot, MD  HYDROcodone-acetaminophen [facility]) 5-325 MG tablet Take 2 tablets by mouth every 6 (six) hours as needed for severe pain. Patient not taking: Reported on 07/10/2017 12/01/16   Merlyn Lot, MD  naproxen (NAPROSYN) 500 MG tablet Take 1 tablet (500 mg total) by mouth 2 (two) times daily with a meal. Patient not taking: Reported on 07/10/2017 12/01/16 12/01/17  Merlyn Lot, MD  oxyCODONE (OXY IR/ROXICODONE) 5 MG immediate release tablet Take 1 tablet (5 mg total) by mouth once as needed for moderate pain. Patient not taking: Reported on 07/10/2017 08/08/16   Fritzi Mandes, MD   Physical Exam: Vitals:   07/10/17 1100 07/10/17 1130 07/10/17 1200 07/10/17 1230  BP: 127/89 [facility])  136/94 119/83 (!) 125/91  Pulse: (!) 101 96 89 90  Resp: (!) 21 (!) 21 20 19   Temp:      TempSrc:      SpO2: (!) 89% (!) 89% 93% 94%  Weight:         General:  No apparent distress, WDWN, [place]/AT  Eyes: PERRL, EOMI, no scleral icterus, conjunctiva clear  ENT: moist oropharynx without exudate, TM's benign, dentition fair  Neck:  supple, no lymphadenopathy. No bruits or thyromegaly  Cardiovascular: regular rate without MRG; 2+ peripheral pulses, no JVD, no peripheral edema  Respiratory: basilar rhonchi without wheezes or rales. No dullness. Respiratory effort increased  Abdomen: soft, non tender to palpation, positive bowel sounds, no guarding, no rebound  Skin: no rashes or lesions  Musculoskeletal: normal bulk and tone, no joint swelling  Psychiatric: normal mood and affect, A&OX3  Neurologic: CN 2-12 grossly intact, Motor strength 5/5 in all 4 groups with symmetric DTR's and non-focal sensory exam  Labs on Admission:  Basic Metabolic Panel: Recent Labs  Lab 07/10/17 1310  NA 135  K 4.0  CL 101  CO2 21*  GLUCOSE 125*  BUN 21*  CREATININE 1.21  CALCIUM 8.6*   Liver Function Tests: No results for input(s): AST, ALT, ALKPHOS, BILITOT, PROT, ALBUMIN in the last 168 hours. No results for input(s): LIPASE, AMYLASE in the last 168 hours. No results for input(s): AMMONIA in the last 168 hours. CBC: Recent Labs  Lab 07/10/17 1049  WBC 8.0  NEUTROABS 5.2  HGB 16.6  HCT 51.8  MCV 94.9  PLT 191   Cardiac Enzymes: Recent Labs  Lab 07/10/17 1310  TROPONINI <0.03    BNP (last 3 results) Recent Labs    08/05/16 1250  BNP 263.0*    ProBNP (last 3 results) No results for input(s): PROBNP in the last 8760 hours.  CBG: No results for input(s): GLUCAP in the last 168 hours.  Radiological Exams on Admission: Dg Chest 2 View  Result Date: 07/10/2017 CLINICAL DATA:  Smoker, shortness of breath, cough, emphysema EXAM: CHEST  2 VIEW COMPARISON:  12/01/2016 FINDINGS: Diffuse emphysema pattern with hyperinflation and parenchymal scarring. Medial basilar scarring/atelectasis noted. Healed rib fractures noted bilaterally. No definite focal pneumonia, collapse or consolidation. Negative for edema, effusion or pneumothorax. Trachea is midline. Normal heart size and vascularity. Left subclavian 2 lead  pacer noted. IMPRESSION: Chronic COPD/emphysema pattern with hyperinflation. Medial bibasilar scarring/atelectasis. Thoracic aortic atherosclerosis Electronically Signed   By: Jerilynn Mages.  Shick M.D.   On: 07/10/2017 10:46   Ct Angio Chest Pe W/cm &/or Wo Cm  Result Date: 07/10/2017 CLINICAL DATA:  Increasing shortness of breath. EXAM: CT ANGIOGRAPHY CHEST WITH CONTRAST TECHNIQUE: Multidetector CT imaging of the chest was performed using the standard protocol during bolus administration of intravenous contrast. Multiplanar CT image reconstructions and MIPs were obtained to evaluate the vascular anatomy. CONTRAST:  49mL ISOVUE-370 IOPAMIDOL (ISOVUE-370) INJECTION 76% COMPARISON:  Chest x-ray July 10, 2017.  Chest CT December 01, 2016 FINDINGS: Cardiovascular: Mild atherosclerotic changes in the torturous thoracic aorta. No dissection. 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 The heart is unchanged. Coronary artery calcifications are seen in the left coronary arteries. The pulmonary arteries are normal in caliber. No pulmonary emboli. Mediastinum/Nodes: No enlarged mediastinal, hilar, or axillary lymph nodes. Thyroid gland, trachea, and esophagus demonstrate no significant findings. Lungs/Pleura: The trachea and mainstem bronchi are normal. There is bronchial wall thickening in the lower lobe  bronchi with some narrowing in the left lower lobe. Bibasilar opacities are identified consistent with pneumonia on the left and infiltrate versus atelectasis on the right. Tree-in-bud opacities are seen, most prominent in the bases, right greater than left. No suspicious nodules or masses. Emphysematous changes are identified. Upper Abdomen: There is a large cyst in the left kidney and a small stone in the right kidney. No other abnormalities in the upper abdomen. Musculoskeletal: No chest wall abnormality. No acute or significant osseous findings. Review of the MIP images  confirms the above findings. IMPRESSION: 1. Bibasilar infiltrates and bilateral lower lobe bronchial wall thickening consistent with an infectious process/pneumonia. Recommend short-term follow-up to ensure resolution. The bronchial wall thickening narrows the left lower lobe bronchi, recommend attention on follow-up. 2. No pulmonary emboli. 3. 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 Recommend annual imaging followup by CTA or MRA. This recommendation follows 2010 ACCF/AHA/AATS/ACR/ASA/SCA/SCAI/SIR/STS/SVM Guidelines for the Diagnosis and Management of Patients with Thoracic Aortic Disease. Circulation.2010; 121: K354-S568 4. Coronary artery calcifications.  Thoracic aortic calcifications. 5. Emphysema. Aortic Atherosclerosis (ICD10-I70.0) and Emphysema (ICD10-J43.9). Electronically Signed   By: Dorise Bullion III M.D   On: 07/10/2017 14:51    EKG: Independently reviewed.  Assessment/Plan Principal Problem:   Acute respiratory failure (HCC) Active Problems:   CAP (community acquired pneumonia)   Influenza A   COPD exacerbation ([place])   Will admit to floor with IV fluids, IV ABX, O2, SVN's and Tamiflu. Check echo. Wean O2 as tolerated. Consult CM for D/C planning. Repeat labs in AM.  Diet: low salt Fluids: NS@75  DVT Prophylaxis: SQ Heparin  Code Status: FULL  Family Communication: none  Disposition Plan: home  Time spent: 50 min

## 2017-07-10 NOTE — Progress Notes (Addendum)
Pharmacy Antibiotic Note  James Dickerson is a 77 y.o. male admitted on 07/10/2017 with pneumonia (CAP).  Pharmacy has been consulted for Zithromax dosing.  Plan: Azithromycin 500mg  every 24 hours   Spoke to Dr. Doy Hutching regarding QTc interval, 517. Would like to initiate azithromycin instead of levofloxacin. Will continue to follow.   Ht: 5'8" (calculated ~CrCl=75ml/min)   Weight: 145 lb (65.8 kg)  Temp (24hrs), Avg:98.2 F (36.8 C), Min:98.2 F (36.8 C), Max:98.2 F (36.8 C)  Recent Labs  Lab 07/10/17 1049 07/10/17 1310  WBC 8.0  --   CREATININE  --  1.21    CrCl cannot be calculated (Unknown ideal weight.).    No Known Allergies  Antimicrobials this admission: 2/16 Zithromax >>  2/16 Oseltamivir >>   Dose adjustments this admission:   Microbiology results:   Thank you for allowing pharmacy to be a part of this patient's care.  Candelaria Stagers, PharmD Pharmacy Resident  07/10/2017 5:07 PM

## 2017-07-10 NOTE — ED Notes (Signed)
Pt given food tray.

## 2017-07-10 NOTE — ED Triage Notes (Signed)
Pt to ed via ems from home with c/o sob increasing over the last 3 days.  States worse this am. +smoker.  Pt reports noncompliance with meds at home.  Pt with IV in place per ems in left ac 20g, sl.

## 2017-07-10 NOTE — ED Notes (Signed)
Report received - pt had labs and iv established prior to this nurse's arrival. Pt received solu-medrol prior to arrival and

## 2017-07-10 NOTE — ED Notes (Signed)
Pt to xray

## 2017-07-11 ENCOUNTER — Inpatient Hospital Stay: Admit: 2017-07-11 | Payer: Medicare Other

## 2017-07-11 DIAGNOSIS — J9690 Respiratory failure, unspecified, unspecified whether with hypoxia or hypercapnia: Secondary | ICD-10-CM | POA: Diagnosis present

## 2017-07-11 LAB — CBC
HCT: 41.6 % (ref 40.0–52.0)
Hemoglobin: 14.1 g/dL (ref 13.0–18.0)
MCH: 31.5 pg (ref 26.0–34.0)
MCHC: 34 g/dL (ref 32.0–36.0)
MCV: 92.8 fL (ref 80.0–100.0)
PLATELETS: 168 10*3/uL (ref 150–440)
RBC: 4.48 MIL/uL (ref 4.40–5.90)
RDW: 13.2 % (ref 11.5–14.5)
WBC: 9.8 10*3/uL (ref 3.8–10.6)

## 2017-07-11 LAB — COMPREHENSIVE METABOLIC PANEL
ALK PHOS: 51 U/L (ref 38–126)
ALT: 11 U/L — AB (ref 17–63)
AST: 15 U/L (ref 15–41)
Albumin: 3.1 g/dL — ABNORMAL LOW (ref 3.5–5.0)
Anion gap: 5 (ref 5–15)
BUN: 26 mg/dL — ABNORMAL HIGH (ref 6–20)
CALCIUM: 8.4 mg/dL — AB (ref 8.9–10.3)
CO2: 26 mmol/L (ref 22–32)
CREATININE: 0.89 mg/dL (ref 0.61–1.24)
Chloride: 106 mmol/L (ref 101–111)
GFR calc Af Amer: >60 mL/min (ref >60)
Glucose, Bld: 142 mg/dL — ABNORMAL HIGH (ref 65–99)
Potassium: 4.3 mmol/L (ref 3.5–5.1)
Sodium: 137 mmol/L (ref 135–145)
Total Bilirubin: 1.2 mg/dL (ref 0.3–1.2)
Total Protein: 6.5 g/dL (ref 6.5–8.1)

## 2017-07-11 LAB — TROPONIN I: Troponin I: <0.03 ng/mL (ref <0.03)

## 2017-07-11 LAB — GLUCOSE, CAPILLARY: GLUCOSE-CAPILLARY: 127 mg/dL — AB (ref 65–99)

## 2017-07-11 MED ORDER — PREDNISONE 20 MG PO TABS: 40.0000 mg | ORAL_TABLET | Freq: Every day | ORAL | 0 refills | Status: AC

## 2017-07-11 MED ORDER — NICOTINE 21 MG/24HR TD PT24: 21.0000 mg | MEDICATED_PATCH | Freq: Every day | TRANSDERMAL | 0 refills | Status: DC

## 2017-07-11 NOTE — Care Management CC44 (Signed)
Condition Code 44 Documentation Completed  Patient Details  Name: James Dickerson MRN: [id number] Date of Birth: Sep 28, 1940   Condition Code 44 given:  Yes Patient signature on Condition Code 44 notice:  Yes Documentation of 2 MD's agreement:  Yes Code 44 added to claim:  Yes    Rashel Okeefe A, RN 07/11/2017, 12:47 PM

## 2017-07-11 NOTE — Care Management Obs Status (Signed)
[place] NOTIFICATION   Patient Details  Name: James Dickerson MRN: [id number] Date of Birth: [birth year]   Medicare Observation Status Notification Given:  Yes    Calton Harshfield A, RN 07/11/2017, 12:47 PM

## 2017-07-11 NOTE — Discharge Summary (Signed)
[facility] NAME: James Dickerson    MR#:  [id number]  DATE OF BIRTH:  1940/10/21  DATE OF ADMISSION:  07/10/2017 ADMITTING PHYSICIAN: Idelle Crouch, MD  DATE OF DISCHARGE: 2.17.2019  PRIMARY CARE PHYSICIAN: Lorelee Market, MD    ADMISSION DIAGNOSIS:  Influenza A [J10.1] Hypoxia [R09.02] COPD exacerbation ([place]) [J44.1]  DISCHARGE DIAGNOSIS:  Principal Problem:   Acute respiratory failure ([place]) Active Problems:     Influenza A     SECONDARY DIAGNOSIS:   Past Medical History:  Diagnosis Date  . Back pain   . Cancer [facility])    prostate  . Cardiomegaly   . Cystic kidney disease   . Depressive disorder   . GERD (gastroesophageal reflux disease)   . High cholesterol   . History of prostate cancer   . Hypertension   . Thyroid nodule     HOSPITAL COURSE:  77 year old male with history of COPD and tobacco dependence who presents with shortness of breath.  1. Acute hypoxic respiratory failure in the setting influenza A and COPD exacerbation Patient has been weaned to room air  2. Influenza A: Patient will continue course of Tamiflu for 5 days total.  3. Acute exacerbation of COPD due to influenza: Patient will be discharged on oral prednisone for 4 days. He has no signs of pneumonia and does not need antibiotics.  4. Dementia: Continue donepezil  5. BPH: Continue tamsulosin  6. Hyperlipidemia: Continue statin  7. Tobacco dependence: Patient is encouraged to quit smoking. Counseling was provided for 4 minutes.    DISCHARGE CONDITIONS AND DIET:  Stable Regular diet  CONSULTS OBTAINED:    DRUG ALLERGIES:  No Known Allergies  DISCHARGE MEDICATIONS:   Allergies as of 07/11/2017   No Known Allergies     Medication List    STOP taking these medications   baclofen 10 MG tablet Commonly known as:  LIORESAL   HYDROcodone-acetaminophen 5-325 MG tablet Commonly known as:  NORCO   naproxen 500 MG  tablet Commonly known as:  NAPROSYN   oxyCODONE 5 MG immediate release tablet Commonly known as:  Oxy IR/ROXICODONE     TAKE these medications   albuterol 108 (90 Base) MCG/ACT inhaler Commonly known as:  PROVENTIL HFA;VENTOLIN HFA Inhale 2 puffs into the lungs every 6 (six) hours as needed for wheezing or shortness of breath.   budesonide-formoterol 160-4.5 MCG/ACT inhaler Commonly known as:  SYMBICORT Inhale 2 puffs into the lungs 2 (two) times daily.   donepezil 5 MG tablet Commonly known as:  ARICEPT Take 5 mg by mouth at bedtime.   nicotine 21 mg/24hr patch Commonly known as:  NICODERM CQ Place 1 patch (21 mg total) onto the skin daily.   predniSONE 20 MG tablet Commonly known as:  DELTASONE Take 2 tablets (40 mg total) by mouth daily with breakfast for 4 days.   simvastatin 20 MG tablet Commonly known as:  ZOCOR Take 20 mg by mouth daily.   tamsulosin 0.4 MG Caps capsule Commonly known as:  FLOMAX Take 0.4 mg by mouth daily.   tiotropium 18 MCG inhalation capsule Commonly known as:  SPIRIVA Place 18 mcg into inhaler and inhale daily.         Today   CHIEF COMPLAINT:  Doing well ambulated without O2    VITAL SIGNS:  Blood pressure 126/81, pulse 77, temperature 98 F (36.7 C), temperature source Oral, resp. rate 18, height 5\' 8"  (1.727 m), weight 59.2 kg (  130 lb 8.2 oz), SpO2 93 %.   REVIEW OF SYSTEMS:  Review of Systems  Constitutional: Negative.  Negative for chills, fever and malaise/fatigue.  HENT: Negative.  Negative for ear discharge, ear pain, hearing loss, nosebleeds and sore throat.   Eyes: Negative.  Negative for blurred vision and pain.  Respiratory: Positive for cough. Negative for hemoptysis, shortness of breath and wheezing.   Cardiovascular: Negative.  Negative for chest pain, palpitations and leg swelling.  Gastrointestinal: Negative.  Negative for abdominal pain, blood in stool, diarrhea, nausea and vomiting.  Genitourinary:  Negative.  Negative for dysuria.  Musculoskeletal: Negative.  Negative for back pain.  Skin: Negative.   Neurological: Negative for dizziness, tremors, speech change, focal weakness, seizures and headaches.  Endo/Heme/Allergies: Negative.  Does not bruise/bleed easily.  Psychiatric/Behavioral: Negative.  Negative for depression, hallucinations and suicidal ideas.     PHYSICAL EXAMINATION:  GENERAL:  77 y.o.-year-old patient lying in the bed with no acute distress.  NECK:  Supple, no jugular venous distention. No thyroid enlargement, no tenderness.  LUNGS: Normal breath sounds bilaterally, no wheezing, rales,rhonchi  No use of accessory muscles of respiration.  CARDIOVASCULAR: S1, S2 normal. No murmurs, rubs, or gallops.  ABDOMEN: Soft, non-tender, non-distended. Bowel sounds present. No organomegaly or mass.  EXTREMITIES: No pedal edema, cyanosis, or clubbing.  PSYCHIATRIC: The patient is alert and oriented x 3.  SKIN: No obvious rash, lesion, or ulcer.   DATA REVIEW:   CBC Recent Labs  Lab 07/11/17 0508  WBC 9.8  HGB 14.1  HCT 41.6  PLT 168    Chemistries  Recent Labs  Lab 07/11/17 0508  NA 137  K 4.3  CL 106  CO2 26  GLUCOSE 142*  BUN 26*  CREATININE 0.89  CALCIUM 8.4*  AST 15  ALT 11*  ALKPHOS 51  BILITOT 1.2    Cardiac Enzymes Recent Labs  Lab 07/10/17 1719 07/10/17 2245 07/11/17 0508  TROPONINI <0.03 <0.03 <0.03    Microbiology Results  @[email or web address] @  RADIOLOGY:  Dg Chest 2 View  Result Date: 07/10/2017 CLINICAL DATA:  Smoker, shortness of breath, cough, emphysema EXAM: CHEST  2 VIEW COMPARISON:  12/01/2016 FINDINGS: Diffuse emphysema pattern with hyperinflation and parenchymal scarring. Medial basilar scarring/atelectasis noted. Healed rib fractures noted bilaterally. No definite focal pneumonia, collapse or consolidation. Negative for edema, effusion or pneumothorax. Trachea is midline. Normal heart size and vascularity. Left subclavian 2 lead  pacer noted. IMPRESSION: Chronic COPD/emphysema pattern with hyperinflation. Medial bibasilar scarring/atelectasis. Thoracic aortic atherosclerosis Electronically Signed   By: Jerilynn Mages.  Shick M.D.   On: 07/10/2017 10:46   Ct Angio Chest Pe W/cm &/or Wo Cm  Result Date: 07/10/2017 CLINICAL DATA:  Increasing shortness of breath. EXAM: CT ANGIOGRAPHY CHEST WITH CONTRAST TECHNIQUE: Multidetector CT imaging of the chest was performed using the standard protocol during bolus administration of intravenous contrast. Multiplanar CT image reconstructions and MIPs were obtained to evaluate the vascular anatomy. CONTRAST:  40mL ISOVUE-370 IOPAMIDOL (ISOVUE-370) INJECTION 76% COMPARISON:  Chest x-ray July 10, 2017.  Chest CT December 01, 2016 FINDINGS: Cardiovascular: Mild atherosclerotic changes in the torturous thoracic aorta. No dissection. 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 The heart is unchanged. Coronary artery calcifications are seen in the left coronary arteries. The pulmonary arteries are normal in caliber. No pulmonary emboli. Mediastinum/Nodes: No enlarged mediastinal, hilar, or axillary lymph nodes. Thyroid gland, trachea, and esophagus demonstrate no significant findings. Lungs/Pleura: The trachea and mainstem bronchi are  normal. There is bronchial wall thickening in the lower lobe bronchi with some narrowing in the left lower lobe. Bibasilar opacities are identified consistent with pneumonia on the left and infiltrate versus atelectasis on the right. Tree-in-bud opacities are seen, most prominent in the bases, right greater than left. No suspicious nodules or masses. Emphysematous changes are identified. Upper Abdomen: There is a large cyst in the left kidney and a small stone in the right kidney. No other abnormalities in the upper abdomen. Musculoskeletal: No chest wall abnormality. No acute or significant osseous findings. Review of the MIP images  confirms the above findings. IMPRESSION: 1. Bibasilar infiltrates and bilateral lower lobe bronchial wall thickening consistent with an infectious process/pneumonia. Recommend short-term follow-up to ensure resolution. The bronchial wall thickening narrows the left lower lobe bronchi, recommend attention on follow-up. 2. No pulmonary emboli. 3. 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 Recommend annual imaging followup by CTA or MRA. This recommendation follows 2010 ACCF/AHA/AATS/ACR/ASA/SCA/SCAI/SIR/STS/SVM Guidelines for the Diagnosis and Management of Patients with Thoracic Aortic Disease. Circulation.2010; 121: P950-D326 4. Coronary artery calcifications.  Thoracic aortic calcifications. 5. Emphysema. Aortic Atherosclerosis (ICD10-I70.0) and Emphysema (ICD10-J43.9). Electronically Signed   By: Dorise Bullion III M.D   On: 07/10/2017 14:51      Allergies as of 07/11/2017   No Known Allergies     Medication List    STOP taking these medications   baclofen 10 MG tablet Commonly known as:  LIORESAL   HYDROcodone-acetaminophen 5-325 MG tablet Commonly known as:  NORCO   naproxen 500 MG tablet Commonly known as:  NAPROSYN   oxyCODONE 5 MG immediate release tablet Commonly known as:  Oxy IR/ROXICODONE     TAKE these medications   albuterol 108 (90 Base) MCG/ACT inhaler Commonly known as:  PROVENTIL HFA;VENTOLIN HFA Inhale 2 puffs into the lungs every 6 (six) hours as needed for wheezing or shortness of breath.   budesonide-formoterol 160-4.5 MCG/ACT inhaler Commonly known as:  SYMBICORT Inhale 2 puffs into the lungs 2 (two) times daily.   donepezil 5 MG tablet Commonly known as:  ARICEPT Take 5 mg by mouth at bedtime.   nicotine 21 mg/24hr patch Commonly known as:  NICODERM CQ Place 1 patch (21 mg total) onto the skin daily.   predniSONE 20 MG tablet Commonly known as:  DELTASONE Take 2 tablets (40 mg total) by mouth daily with breakfast for 4  days.   simvastatin 20 MG tablet Commonly known as:  ZOCOR Take 20 mg by mouth daily.   tamsulosin 0.4 MG Caps capsule Commonly known as:  FLOMAX Take 0.4 mg by mouth daily.   tiotropium 18 MCG inhalation capsule Commonly known as:  SPIRIVA Place 18 mcg into inhaler and inhale daily.           Management plans discussed with the patient and he is in agreement. Stable for discharge home  Patient should follow up with pcp  CODE STATUS:     Code Status Orders  (From admission, onward)        Start     Ordered   07/10/17 1657  Full code  Continuous     07/10/17 1656    Code Status History    Date Active Date Inactive Code Status Order ID Comments User Context   08/05/2016 16:44 08/08/2016 17:11 Full Code [id number]  Gladstone Lighter, MD ED      TOTAL TIME TAKING CARE OF THIS PATIENT: 38 minutes.    Note: This dictation  was prepared with Dragon dictation along with smaller phrase technology. Any transcriptional errors that result from this process are unintentional.  Johari Pinney M.D on 07/11/2017 at 11:13 AM  Between 7am to 6pm - Pager - [phone] After 6pm go to [email or web address] - password [email or web address] [facility]  Office  [phone]  CC: Primary care physician; Lorelee Market, MD

## 2017-07-15 ENCOUNTER — Telehealth: Payer: Self-pay

## 2017-07-15 NOTE — Telephone Encounter (Signed)
EMMI Follow-up: Talked with patient and he is aware to contact PCP for any changes in condition and said he was taking his medications.  Patient voiced concern with transportation issues to appointments and I discussed some resources with him ([place]). I will mail out information on [employer] as he thought he could afford that fare if they were able to service his area.

## 2017-07-15 NOTE — Discharge Instructions (Signed)
Acute Respiratory Distress Syndrome, Adult Acute respiratory distress syndrome is a life-threatening condition in which fluid collects in the lungs. This prevents the lungs from filling with air and passing oxygen into the blood. This can cause the lungs and other vital organs to fail. The condition usually develops following an infection, illness, surgery, or injury. What are the causes? This condition may be caused by:  An infection, such as sepsis or pneumonia.  A serious injury to the head or chest.  Severe bleeding from an injury.  A major surgery.  Breathing in harmful chemicals or smoke.  Blood transfusions.  A blood clot in the lungs.  Breathing in vomit (aspiration).  Near-drowning.  Inflammation of the pancreas (pancreatitis).  A drug overdose.  What are the signs or symptoms? Sudden shortness of breath and rapid breathing are the main symptoms of this condition. Other symptoms may include:  A fast or irregular heartbeat.  Skin, lips, or fingernails that look blue (cyanosis).  Confusion.  Tiredness or loss of energy.  Chest pain, particularly while taking a breath.  Coughing.  Restlessness or anxiety.  Fever. This is usually present if there is an underlying infection, such as pneumonia.  How is this diagnosed? This condition is diagnosed based on:  Your symptoms.  Medical history.  A physical exam. During the exam, your health care provider will listen to your heart and check for crackling or wheezing sounds in your lungs.  You may also have other tests to confirm the diagnosis and measure how well your lungs are working. These may include:  Measuring the amount of oxygen in your blood. Your health care provider will use two methods to do this procedure: ? A small device (pulse oximeter) that is placed on your finger, earlobe, or toe. ? An arterial blood gas test. A sample of blood is taken from an artery and tested for oxygen levels.  Blood  tests.  Chest X-rays or CT scans to look for fluid in the lungs.  Taking a sample of your sputum to test for infection.  Heart test, such as an echocardiogram or electrocardiogram. This is done to rule out any heart problems (such as heart failure) that may be causing your symptoms.  Bronchoscopy. During this test, a thin, flexible tube with a light is passed into the mouth or nose, down the windpipe, and into the lungs.  How is this treated? Treatment depends on the cause of your condition. The goal is to support you while your lungs heal and the underlying cause is treated. Treatment may include:  Oxygen therapy. This may be done through: ? A tube in your nose or a face mask. ? A ventilator. This device helps move air into and out of your lungs through a breathing tube that is inserted into your mouth or nose.  Continuous positive airway pressure (CPAP). This treatment uses mild air pressure to keep the airways open. A mask or other device will be placed over your nose or mouth.  Tracheostomy. During this procedure, a small cut is made in your neck to create an opening to your windpipe. A breathing tube is placed directly into your windpipe. The breathing tube is connected to a ventilator. This is done if you have problems with your airway or if you need a ventilator for a long period of time.  Positioning you to lie on your stomach (prone position).  Medicines, such as: ? Sedatives to help you relax. ? Blood pressure medicines. ? Antibiotics to treat  infection. ? Blood thinners to prevent blood clots. ? Diuretics to help prevent excess fluid.  Fluids and nutrients given through an IV tube.  Wearing compression stockings on your legs to prevent blood clots.  Extra corporeal membrane oxygenation (ECMO). This treatment takes blood outside your body, adds oxygen, and removes carbon dioxide. The blood is then returned to your body. This treatment is only used in severe  cases.  Follow these instructions at home:  Take over-the-counter and prescription medicines only as told by your health care provider.  Do not use any products that contain nicotine or tobacco, such as cigarettes and e-cigarettes. If you need help quitting, ask your health care provider.  Limit alcohol intake to no more than 1 drink per day for nonpregnant women and 2 drinks per day for men. One drink equals 12 oz of beer, 5 oz of wine, or 1 oz of hard liquor.  Ask friends and family to help you if daily activities make you tired.  Attend any pulmonary rehabilitation as told by your health care provider. This may include: ? Education about your condition. ? Exercises. ? Breathing training. ? Counseling. ? Learning techniques to conserve energy. ? Nutrition counseling.  Keep all follow-up visits as told by your health care provider. This is important. Contact a health care provider if:  You become short of breath during activity or while resting.  You develop a cough that does not go away.  You have a fever.  Your symptoms do not get better or they get worse.  You become anxious or depressed. Get help right away if:  You have sudden shortness of breath.  You develop sudden chest pain that does not go away.  You develop a rapid heart rate.  You develop swelling or pain in one of your legs.  You cough up blood.  You have trouble breathing.  Your skin, lips, or fingernails turn blue. These symptoms may represent a serious problem that is an emergency. Do not wait to see if the symptoms will go away. Get medical help right away. Call your local emergency services (911 in the [place] Do not drive yourself to the hospital. Summary  Acute respiratory distress syndrome is a life-threatening condition in which fluid collects in the lungs, which leads the lungs and other vital organs to fail.  This condition usually develops following an infection, illness, surgery, or  injury.  Sudden shortness of breath and rapid breathing are the main symptoms of acute respiratory distress syndrome.  Treatment may include oxygen therapy, continuous positive airway pressure (CPAP), tracheostomy, lying on your stomach (prone position), medicines, fluids and nutrients given through an IV tube, compression stockings, and extra corporeal membrane oxygenation (ECMO). This information is not intended to replace advice given to you by your health care provider. Make sure you discuss any questions you have with your health care provider. Document Released: 05/11/2005 Document Revised: 04/27/2016 Document Reviewed: 04/27/2016 Elsevier Interactive Patient Education  2017 [employer].   Antibiotic Resistance Antibiotics are medicines used to treat infections that are caused by bacteria. Antibiotic resistance means that the medicine no longer works against the bacteria. Resistance can develop if you use antibiotics the wrong way. When antibiotics are given in response to illnesses caused by viruses, like colds or the flu, many normal bacteria in the body are killed. Some bacteria that are not killed may develop resistance to the antibiotic. These bacteria may grow and cause infections that are resistant to some other antibiotics. If this  happens, the bacteria can continue to grow and cause infection. What are the causes? Antibiotic resistance happens when bacteria come into contact with an antibiotic over and over again. Over time, the bacteria become resistant to the antibiotic. What increases the risk? This condition is more likely to develop in people who:  Are repeatedly given antibiotics to treat viral infections.  Do not take their antibiotic medicine as prescribed, such as not finishing all of the medicine.  Need to take antibiotics often because of a long-term medical condition.  Take medicines that weaken their body's defense system (immune system).  Have surgery.  Are  elderly.  Need a procedure that replaces some of the work that healthy kidneys do (dialysis).  Have an organ transplant.  Are being treated for cancer.  Have a type of infection that is more likely to be caused by resistant bacteria. These include certain: ? Skin infections. ? STIs (sexually transmitted infections). ? Respiratory infections. ? Infections of the lining of the brain and spinal cord (meningitis). ? Gastrointestinal infections.  Eat foods from animals that were treated with antibiotics. Antibiotic-resistant bacteria can be passed through the food.  Live with or care for someone with an antibiotic-resistant infection.  Are hospitalized for a long timeor live in a long-term care facility.  What are the signs or symptoms? The main symptom of this condition is having an infection that does not improve with normal treatment. The specific signs and symptoms that you have will depend on the type of infection, but they may include:  A fever.  Warmth, redness, and tenderness around a wound or incision.  Brown, yellow, or green drainage from a wound or incision.  A bad smell coming from a wound or incision.  Nausea, vomiting, and abdominal pain.  How is this diagnosed? This condition may be diagnosed by:  Your medical history. Your health care provider may suspect antibiotic resistance if your condition does not improve after you have been treated for an infection.  You may also have other tests, including: ? Analysis of a fluid or stool sample. This is done to identify bacteria under a microscope and determine what type of antibiotic will work against them (culture and sensitivity). ? Other blood tests and imaging tests. These are done to check if your infection has spread or has become more serious.  How is this treated? Treatment for this condition depends on the nature of the specific infection.  Treatment may include oral antibiotics that kill more types of  bacteria (broad spectrum).  Serious antibiotic-resistant infections may need to be treated in the hospital. In severe cases, this may include: ? Surgery to remove infected or damaged tissue. ? Antibiotics or other medicines given through an IV tube.  Follow these instructions at home: Taking antibiotics correctly  Understand when antibiotics are needed and when they are not needed.  Do not ask for an antibiotic prescription if you have been diagnosed with a viral illness. Antibiotic medicine will not make your illness go away faster. Common viral illnesses include an ear infection, a sinus infection, the stomach flu, or bronchitis.  Do not take antibiotics that are left over from a previous prescription.  Do not take antibiotics that were prescribed for someone else.  If you are prescribed an antibiotic: ? Take it exactly as told by your health care provider. Do not stop taking the medicine even if you start to feel better. ? If you have been taking it for more than 10 days, ask  your health care provider or pharmacist if you should keep taking it.  Do not save unused antibiotics to use at a later date. Get rid of unused medicine as told by your health care provider or pharmacist. Preventing infection  If you have an infection, avoid close contact with people around you.  Avoid using personal items that are used by other people, such as towels, razors, or bedding.  Regularly disinfect doorknobs, food preparation surfaces, and bathrooms with bleach-containing products or solutions.  Wash your hands with soap and water: ? After using the bathroom. ? Before and after caring for a wound or incision. ? Before and after preparing food. General instructions  Stay up-to-date on vaccinations. Many vaccines prevent illnesses that are treated with antibiotics. Contact a health care provider if:  You have a fever or chills.  You are taking a new antibiotic and you are not getting better  after a few days.  You have three or more periods of diarrhea after starting a new antibiotic.  You have increased warmth, redness, or tenderness around a wound.  You have brown, yellow, or green drainage from a wound or incision.  You have a bad-smelling wound or incision.  You have new or worsening nausea, vomiting, or abdominal pain. Get help right away if:  You develop a rash.  Your mouth or tongue itches.  You have a tight feeling in your throat.  You have trouble breathing.  You have chest pain or tightness.  You are dizzy or you faint. This information is not intended to replace advice given to you by your health care provider. Make sure you discuss any questions you have with your health care provider. Document Released: 08/01/2002 Document Revised: 11/29/2015 Document Reviewed: 10/14/2015 Elsevier Interactive Patient Education  [employer].

## 2017-10-06 ENCOUNTER — Institutional Professional Consult (permissible substitution): Payer: No Typology Code available for payment source | Admitting: Internal Medicine

## 2017-10-06 NOTE — Progress Notes (Deleted)
  The patient is a 77 year old male, he was admitted to the hospital on 07/10/2017 for 1 day with acute exacerbation of COPD and influenza A.  **Imaging personally reviewed, CT chest and chest x-ray 07/10/2017, emphysema, bibasilar infiltrate suggestive of pneumonia.  Some his infiltrates appear to emanate from regions of scarring seen on previous CT chest on 12/01/2016. **CBC with differential on 07/10/2017; eosinophil count equals 100. **Echocardiogram on 08/05/2016; EF equals 65%.  Normal RV pressures noted

## 2017-10-11 NOTE — Progress Notes (Signed)
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  - Dyspnea on exertion, likely multifactorial from her lung disease, as well as deconditioning. - Patient asked to continue Dulera 2 puffs twice daily, rinse mouth after use.  He is asked to increase his activity level. - No evidence of oxygen saturation on desat walk today, patient is instructed that he can discontinue amatory oxygen.  We will check a nocturnal oxygen test to see if he continues to require oxygen with sleep. -Patient has issues with getting transportation, therefore will try to limit any potentially unnecessary testing or other office visits. - Patient declines Prevnar.  Nicotine abuse.  - Continues to smoke, not really considering quitting.  Spent 3 minutes in discussion.   Date: 10/12/2017  MRN# [id number] James Dickerson 08-05-1940   James Dickerson is a 77 y.o. old male seen in consultation for chief complaint of:    Chief Complaint  Patient presents with  . Consult    Referred by C. William for eval of COPD  . COPD    with exertion;pt denies wheezing and or cough and chest tightness.    HPI:   The patient is a 77 year old male who was recently admitted to the hospital on 07/10/2017 for 1 day, for COPD exacerbation due to influenza A.  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 Patient takes dulera 2 puffs twice per day and feels that it helps with his breathing. He lives in his own home by himself. He does his own  [place] but has trouble because of dyspnea. He also gets winded getting dressed and taking a shower, also when walking around a [employer], but takes a break to sit down.  He worked as Product/process development scientist.  He is smoking 3 cigs per day, he has smoked since the age of 41. He smoked just less than1 ppd until about a year ago.  He has no pets. He has reflux but is mild. Denies sinus drainage.  He uses oxygen at night,  at 4L  **Baseline sat at rest on RA was 94% and HR 60. Pt walked 360 feet at moderate pace, mild dyspnea. Sat was 97% and HR 60 **Imaging personally reviewed, CT chest 07/10/2017, there is biapical emphysematous changes, bibasilar pneumonia versus atelectatic changes.  PMHX:   Past Medical History:  Diagnosis Date  . Back pain   . Cancer [facility])    prostate  . Cardiomegaly   . Cystic kidney disease   . Depressive disorder   . GERD (gastroesophageal reflux disease)   . High cholesterol   . History of prostate cancer   . Hypertension   . Thyroid nodule    Surgical Hx:  Past Surgical History:  Procedure Laterality Date  . APPENDECTOMY  1971  . PACEMAKER INSERTION N/A 08/06/2016   Procedure: INSERTION PACEMAKER;  Surgeon: Isaias Cowman, MD;  Location: [facility] ORS;  Service: Cardiovascular;  Laterality: N/A;   Family Hx:  History reviewed. No pertinent family history. Social Hx:   Social History   Tobacco Use  . Smoking status: Current Every Day Smoker  . Smokeless tobacco: Never Used  Substance Use Topics  . Alcohol use: No  . Drug use: No   Medication:    Current Outpatient Medications:  .  albuterol (PROVENTIL HFA;VENTOLIN HFA) 108 (90 Base) MCG/ACT inhaler, Inhale 2 puffs into the lungs every 6 (six) hours as needed for wheezing  or shortness of breath., Disp: , Rfl:  .  budesonide-formoterol (SYMBICORT) 160-4.5 MCG/ACT inhaler, Inhale 2 puffs into the lungs 2 (two) times daily., Disp: , Rfl:  .  donepezil (ARICEPT) 5 MG tablet, Take 5 mg by mouth at bedtime., Disp: , Rfl:  .  nicotine (NICODERM CQ) 21 mg/24hr patch, Place 1 patch (21 mg total) onto the skin daily., Disp: 28 patch, Rfl: 0 .  simvastatin (ZOCOR) 20 MG tablet, Take 20 mg by mouth daily., Disp: , Rfl:  .  tamsulosin (FLOMAX) 0.4 MG CAPS capsule, Take 0.4 mg by mouth daily., Disp: , Rfl:  .  tiotropium (SPIRIVA) 18 MCG inhalation capsule, Place 18 mcg into inhaler and inhale daily., Disp: , Rfl:     Allergies:  Patient has no known allergies.  Review of Systems: Gen:  Denies  fever, sweats, chills HEENT: Denies blurred vision, double vision. bleeds, sore throat Cvc:  No dizziness, chest pain. Resp:   Denies cough or sputum production, shortness of breath Gi: Denies swallowing difficulty, stomach pain. Gu:  Denies bladder incontinence, burning urine Ext:   No Joint pain, stiffness. Skin: No skin rash,  hives  Endoc:  No polyuria, polydipsia. Psych: No depression, insomnia. Other:  All other systems were reviewed with the patient and were negative other that what is mentioned in the HPI.   Physical Examination:   VS: BP 122/84 (BP Location: Left Arm, Cuff Size: Normal)   Pulse 61   Resp 16   Ht 5\' 8"  (1.727 m)   Wt 136 lb (61.7 kg)   SpO2 95%   BMI 20.68 kg/m   General Appearance: No distress  Neuro:without focal findings,  speech normal,  HEENT: PERRLA, EOM intact.   Pulmonary: Coarse bibasilar crackles.  No wheezing.  CardiovascularNormal S1,S2.  No m/r/g.   Abdomen: Benign, Soft, non-tender. Renal:  No costovertebral tenderness  GU:  No performed at this time. Endoc: No evident thyromegaly, no signs of acromegaly. Skin:   warm, no rashes, no ecchymosis  Extremities: normal, no cyanosis, clubbing.  Other findings:    LABORATORY PANEL:   CBC No results for input(s): WBC, HGB, HCT, PLT in the last 168 hours. ------------------------------------------------------------------------------------------------------------------  Chemistries  No results for input(s): NA, K, CL, CO2, GLUCOSE, BUN, CREATININE, CALCIUM, MG, AST, ALT, ALKPHOS, BILITOT in the last 168 hours.  Invalid input(s): GFRCGP ------------------------------------------------------------------------------------------------------------------  Cardiac Enzymes No results for input(s): TROPONINI in the last 168 hours. ------------------------------------------------------------  RADIOLOGY:  No  results found.     Thank  you for the consultation and for allowing [facility] to assist in the care of your patient. Our recommendations are noted above.  Please contact us if we can be of further service.   Marda Stalker, MD.  Board Certified in Internal Dickerson, Pulmonary Dickerson, [place], and Sleep Dickerson.  [facility] Office Number: [phone]  Patricia Pesa, M.D.  Merton Border, M.D  10/12/2017

## 2017-10-12 ENCOUNTER — Encounter: Payer: Self-pay | Admitting: Internal Medicine

## 2017-10-12 ENCOUNTER — Ambulatory Visit (INDEPENDENT_AMBULATORY_CARE_PROVIDER_SITE_OTHER): Payer: Medicare Other | Admitting: Internal Medicine

## 2017-10-12 VITALS — BP 122/84 | HR 61 | Resp 16 | Ht 68.0 in | Wt 136.0 lb

## 2017-10-12 DIAGNOSIS — J449 Chronic obstructive pulmonary disease, unspecified: Secondary | ICD-10-CM | POA: Diagnosis not present

## 2017-10-12 NOTE — Patient Instructions (Addendum)
Continue dulera 2 puffs twice daily, rinse mouth after use.  You do not need oxygen during the day.  Try to increase your activity level.  You can transportation companies for patient to help you get to appointments.   --Quitting smoking is the most important thing that you can do for your health.  --Quitting smoking will have greater affect on your health than any medicine that we can give you.

## 2017-12-10 ENCOUNTER — Encounter: Payer: Self-pay | Admitting: Internal Medicine

## 2017-12-14 ENCOUNTER — Telehealth: Payer: Self-pay | Admitting: *Deleted

## 2017-12-14 DIAGNOSIS — J449 Chronic obstructive pulmonary disease, unspecified: Secondary | ICD-10-CM

## 2017-12-14 NOTE — Telephone Encounter (Signed)
Attempted to call with results of ONO. Patient does not have voicemail set up.

## 2017-12-14 NOTE — Telephone Encounter (Signed)
Pt needs 1 liter of 02 qhs.

## 2017-12-15 NOTE — Telephone Encounter (Signed)
2nd attempt to contact patient. No voice mail.

## 2017-12-16 NOTE — Telephone Encounter (Signed)
Patient aware of result of ONO Orders have been placed Nothing further needed.

## 2017-12-22 ENCOUNTER — Other Ambulatory Visit: Payer: Self-pay

## 2017-12-22 ENCOUNTER — Emergency Department
Admission: EM | Admit: 2017-12-22 | Discharge: 2017-12-22 | Disposition: A | Discharge status: Home / Self Care | Payer: Medicare Other | Attending: Student in an Organized Health Care Education/Training Program | Admitting: Student in an Organized Health Care Education/Training Program

## 2017-12-22 ENCOUNTER — Emergency Department: Payer: Medicare Other

## 2017-12-22 DIAGNOSIS — Z950 Presence of cardiac pacemaker: Secondary | ICD-10-CM | POA: Diagnosis not present

## 2017-12-22 DIAGNOSIS — F17200 Nicotine dependence, unspecified, uncomplicated: Secondary | ICD-10-CM | POA: Diagnosis not present

## 2017-12-22 DIAGNOSIS — I10 Essential (primary) hypertension: Secondary | ICD-10-CM | POA: Diagnosis not present

## 2017-12-22 DIAGNOSIS — J441 Chronic obstructive pulmonary disease with (acute) exacerbation: Secondary | ICD-10-CM | POA: Diagnosis not present

## 2017-12-22 DIAGNOSIS — Z79899 Other long term (current) drug therapy: Secondary | ICD-10-CM | POA: Diagnosis not present

## 2017-12-22 DIAGNOSIS — R0602 Shortness of breath: Secondary | ICD-10-CM | POA: Diagnosis present

## 2017-12-22 LAB — BASIC METABOLIC PANEL
ANION GAP: 8 (ref 5–15)
BUN: 19 mg/dL (ref 8–23)
CO2: 26 mmol/L (ref 22–32)
Calcium: 9.2 mg/dL (ref 8.9–10.3)
Chloride: 108 mmol/L (ref 98–111)
Creatinine, Ser: 1.04 mg/dL (ref 0.61–1.24)
Glucose, Bld: 96 mg/dL (ref 70–99)
POTASSIUM: 3.8 mmol/L (ref 3.5–5.1)
SODIUM: 142 mmol/L (ref 135–145)

## 2017-12-22 LAB — TROPONIN I: Troponin I: <0.03 ng/mL (ref <0.03)

## 2017-12-22 LAB — CBC WITH DIFFERENTIAL/PLATELET
BASOS ABS: 0 10*3/uL (ref 0–0.1)
BASOS PCT: 0 %
EOS ABS: 0.2 10*3/uL (ref 0–0.7)
EOS PCT: 3 %
HCT: 43.7 % (ref 40.0–52.0)
Hemoglobin: 14.5 g/dL (ref 13.0–18.0)
Lymphocytes Relative: 42 %
Lymphs Abs: 1.9 10*3/uL (ref 1.0–3.6)
MCH: 31.4 pg (ref 26.0–34.0)
MCHC: 33.2 g/dL (ref 32.0–36.0)
MCV: 94.5 fL (ref 80.0–100.0)
Monocytes Absolute: 0.4 10*3/uL (ref 0.2–1.0)
Monocytes Relative: 9 %
Neutro Abs: 2 10*3/uL (ref 1.4–6.5)
Neutrophils Relative %: 46 %
PLATELETS: 167 10*3/uL (ref 150–440)
RBC: 4.62 MIL/uL (ref 4.40–5.90)
RDW: 14.8 % — ABNORMAL HIGH (ref 11.5–14.5)
WBC: 4.5 10*3/uL (ref 3.8–10.6)

## 2017-12-22 LAB — BRAIN NATRIURETIC PEPTIDE: B NATRIURETIC PEPTIDE 5: 60 pg/mL (ref 0.0–100.0)

## 2017-12-22 MED ORDER — IPRATROPIUM-ALBUTEROL 0.5-2.5 (3) MG/3ML IN SOLN
RESPIRATORY_TRACT | Status: AC
  Filled 2017-12-22: qty 3

## 2017-12-22 MED ORDER — METHYLPREDNISOLONE SODIUM SUCC 125 MG IJ SOLR
125.0000 mg | Freq: Once | INTRAMUSCULAR | Status: AC
Start: 2017-12-22 — End: 2017-12-22
  Administered 2017-12-22: 125 mg via INTRAVENOUS
  Filled 2017-12-22: qty 2

## 2017-12-22 MED ORDER — OPTICHAMBER DIAMOND MISC
1.0000 | Freq: Once | Status: AC
  Administered 2017-12-22: 1

## 2017-12-22 MED ORDER — ALBUTEROL SULFATE HFA 108 (90 BASE) MCG/ACT IN AERS
2.0000 | INHALATION_SPRAY | Freq: Once | RESPIRATORY_TRACT | Status: AC
Start: 2017-12-22 — End: 2017-12-22
  Administered 2017-12-22: 2 via RESPIRATORY_TRACT
  Filled 2017-12-22: qty 6.7

## 2017-12-22 MED ORDER — IPRATROPIUM-ALBUTEROL 0.5-2.5 (3) MG/3ML IN SOLN
3.0000 mL | Freq: Once | RESPIRATORY_TRACT | Status: AC
  Administered 2017-12-22: 3 mL via RESPIRATORY_TRACT
  Filled 2017-12-22: qty 3

## 2017-12-22 MED ORDER — IPRATROPIUM-ALBUTEROL 0.5-2.5 (3) MG/3ML IN SOLN
3.0000 mL | Freq: Once | RESPIRATORY_TRACT | Status: AC
  Administered 2017-12-22: 3 mL via RESPIRATORY_TRACT

## 2017-12-22 MED ORDER — PREDNISONE 20 MG PO TABS: 40.0000 mg | ORAL_TABLET | Freq: Every day | ORAL | 0 refills | Status: AC

## 2017-12-22 MED ORDER — IPRATROPIUM-ALBUTEROL 0.5-2.5 (3) MG/3ML IN SOLN: 3.0000 mL | Freq: Once | RESPIRATORY_TRACT | Status: DC

## 2017-12-22 MED ORDER — AEROCHAMBER PLUS W/MASK MISC: 1.0000 | Freq: Once | Status: DC

## 2017-12-22 NOTE — ED Notes (Signed)
Pt ambulated with pulse oximetry. Tolerated well. Pulse ox at 96% the whole time. RN notified

## 2017-12-22 NOTE — ED Notes (Signed)
Pharmacy contacted regarding inhaler not in pyxis

## 2017-12-22 NOTE — Discharge Instructions (Addendum)
Please use your inhaler every 4-6 hours while awake for the next 3 days.  Be sure to take your steroid medication daily.  Return to the ER if you have any worsening shortness of breath or for any additional questions or concerns.

## 2017-12-22 NOTE — ED Provider Notes (Signed)
[facility] Emergency Department Provider Note    First MD Initiated Contact with Patient 12/22/17 0715     (approximate)  I have reviewed the triage vital signs and the nursing notes.   HISTORY  Chief Complaint Shortness of Breath    HPI James Dickerson is a 77 y.o. male with a history of COPD as well as heart disease status post patient presents to the ER with chief complaint of shortness of breath starting at 2 AM this morning.  Patient does arrive short of breath having difficulty speaking complete sentences.  Denies any chest pain.  No cough.  No fevers.  Did not try any of his nebulizers at home.  Is not been on any recent antibiotics.  No lower extremity swelling.  No nausea or vomiting.  No abdominal pain.    Past Medical History:  Diagnosis Date  . Back pain   . Cancer [facility])    prostate  . Cardiomegaly   . Cystic kidney disease   . Depressive disorder   . GERD (gastroesophageal reflux disease)   . High cholesterol   . History of prostate cancer   . Hypertension   . Thyroid nodule    History reviewed. No pertinent family history. Past Surgical History:  Procedure Laterality Date  . APPENDECTOMY  1971  . PACEMAKER INSERTION N/A 08/06/2016   Procedure: INSERTION PACEMAKER;  Surgeon: Isaias Cowman, MD;  Location: [facility] ORS;  Service: Cardiovascular;  Laterality: N/A;   Patient Active Problem List   Diagnosis Date Noted  . Respiratory failure ([place]) 07/11/2017  . Acute respiratory failure ([place]) 07/10/2017  . CAP (community acquired pneumonia) 07/10/2017  . Influenza A 07/10/2017  . COPD exacerbation ([place]) 07/10/2017  . HLD (hyperlipidemia) 03/09/2017  . Essential hypertension 08/14/2016  . 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 Bradycardia 08/05/2016      Prior to Admission medications   Medication Sig Start Date End Date Taking? Authorizing Provider  budesonide-formoterol (SYMBICORT) 160-4.5 MCG/ACT inhaler Inhale  2 puffs into the lungs 2 (two) times daily.   Yes [provider]  donepezil (ARICEPT) 5 MG tablet Take 5 mg by mouth at bedtime.   Yes [provider]  tamsulosin (FLOMAX) 0.4 MG CAPS capsule Take 0.4 mg by mouth daily.   Yes [provider]  albuterol (PROVENTIL HFA;VENTOLIN HFA) 108 (90 Base) MCG/ACT inhaler Inhale 2 puffs into the lungs every 6 (six) hours as needed for wheezing or shortness of breath.    [provider]  atorvastatin (LIPITOR) 40 MG tablet Take 40 mg by mouth at bedtime.    [provider]  nicotine (NICODERM CQ) 21 mg/24hr patch Place 1 patch (21 mg total) onto the skin daily. Patient not taking: Reported on 12/22/2017 07/11/17   Bettey Costa, MD  tiotropium (SPIRIVA) 18 MCG inhalation capsule Place 18 mcg into inhaler and inhale daily.    [provider]    Allergies Patient has no known allergies.    Social History Social History   Tobacco Use  . Smoking status: Current Every Day Smoker  . Smokeless tobacco: Never Used  Substance Use Topics  . Alcohol use: No  . Drug use: No    Review of Systems Patient denies headaches, rhinorrhea, blurry vision, numbness, shortness of breath, chest pain, edema, cough, abdominal pain, nausea, vomiting, diarrhea, dysuria, fevers, rashes or hallucinations unless otherwise stated above in HPI. ____________________________________________   PHYSICAL EXAM:  VITAL SIGNS: Vitals:   12/22/17 0715  12/22/17 0720  BP:  (!) 179/120  Pulse:  92  Resp:  20  Temp:  97.6 F (36.4 C)  SpO2: 92% 96%    Constitutional: Alert and oriented.  Eyes: Conjunctivae are normal.  Head: Atraumatic. Nose: No congestion/rhinnorhea. Mouth/Throat: Mucous membranes are moist.   Neck: No stridor. Painless ROM.  Cardiovascular: Normal rate, regular rhythm. Grossly normal heart sounds.  Good peripheral circulation. Respiratory: mild tachypnea, prolonged expiratory phase, coarse faint wheeze  noted in all lung fields. Gastrointestinal: Soft and nontender. No distention. No abdominal bruits. No CVA tenderness. Genitourinary: deferred Musculoskeletal: No lower extremity tenderness nor edema.  No joint effusions. Neurologic:  Normal speech and language. No gross focal neurologic deficits are appreciated. No facial droop Skin:  Skin is warm, dry and intact. No rash noted. Psychiatric: Mood and affect are normal. Speech and behavior are normal.  ____________________________________________   LABS (all labs ordered are listed, but only abnormal results are displayed)  Results for orders placed or performed during the hospital encounter of 12/22/17 (from the past 24 hour(s))  CBC with Differential/Platelet     Status: Abnormal   Collection Time: 12/22/17  7:35 AM  Result Value Ref Range   WBC 4.5 3.8 - 10.6 K/uL   RBC 4.62 4.40 - 5.90 MIL/uL   Hemoglobin 14.5 13.0 - 18.0 g/dL   HCT 43.7 40.0 - 52.0 %   MCV 94.5 80.0 - 100.0 fL   MCH 31.4 26.0 - 34.0 pg   MCHC 33.2 32.0 - 36.0 g/dL   RDW 14.8 (H) 11.5 - 14.5 %   Platelets 167 150 - 440 K/uL   Neutrophils Relative % 46 %   Neutro Abs 2.0 1.4 - 6.5 K/uL   Lymphocytes Relative 42 %   Lymphs Abs 1.9 1.0 - 3.6 K/uL   Monocytes Relative 9 %   Monocytes Absolute 0.4 0.2 - 1.0 K/uL   Eosinophils Relative 3 %   Eosinophils Absolute 0.2 0 - 0.7 K/uL   Basophils Relative 0 %   Basophils Absolute 0.0 0 - 0.1 K/uL  Basic metabolic panel     Status: None   Collection Time: 12/22/17  7:35 AM  Result Value Ref Range   Sodium 142 135 - 145 mmol/L   Potassium 3.8 3.5 - 5.1 mmol/L   Chloride 108 98 - 111 mmol/L   CO2 26 22 - 32 mmol/L   Glucose, Bld 96 70 - 99 mg/dL   BUN 19 8 - 23 mg/dL   Creatinine, Ser 1.04 0.61 - 1.24 mg/dL   Calcium 9.2 8.9 - 10.3 mg/dL   GFR calc non Af Amer >60 >60 mL/min   GFR calc Af Amer >60 >60 mL/min   Anion gap 8 5 - 15  Brain natriuretic peptide     Status: None   Collection Time: 12/22/17  7:35 AM    Result Value Ref Range   B Natriuretic Peptide 60.0 0.0 - 100.0 pg/mL  Troponin I     Status: None   Collection Time: 12/22/17  7:35 AM  Result Value Ref Range   Troponin I <0.03 <0.03 ng/mL   ____________________________________________  EKG My review and personal interpretation at Time: 7:25   Indication: sob  Rate: 85  Rhythm: v paced Axis: left Other: paced rhythm ____________________________________________  RADIOLOGY  I personally reviewed all radiographic images ordered to evaluate for the above acute complaints and reviewed radiology reports and findings.  These findings were personally discussed with the patient.  Please see medical  record for radiology report.  ____________________________________________   PROCEDURES  Procedure(s) performed:  Procedures    Critical Care performed: no ____________________________________________   INITIAL IMPRESSION / ASSESSMENT AND PLAN / ED COURSE  Pertinent labs & imaging results that were available during my care of the patient were reviewed by me and considered in my medical decision making (see chart for details).   DDX: Asthma, copd, CHF, pna, ptx, malignancy, Pe, anemia   James Dickerson is a 77 y.o. who presents to the ED with symptoms as described above.  Presentation most clinically concerning for COPD exacerbation.  Chest x-ray shows no evidence of pneumothorax pneumonia or edema.  Does not seem clinically consistent with ACS he denies any chest pain.  Does not seem clinically consistent with PE given the lung sounds on exam.  Patient will receive nebulizer treatment as well as steroids.  Will reassess.  Clinical Course as of Dec 23 1011  Wed Dec 22, 2017  0801 Patient reassessed with much improved aeration after nebulizer treatment.  Patient states that he feels better as well.  Presentation most clinically consistent with COPD exacerbation.  Will observe patient, await results of remainder of blood work and reassess.    [PR]  1011 Patient reassessed.  Significant improvement in treatments after initiation of steroids as well as breathing treatments.  He is not hypoxic.  Patient feels well.  Did offer admission the hospital however patient feels well and as this is a chronic diagnosis for him not requiring any oxygen and he has nebulizers at home I do believe he is appropriate for a trial of outpatient management.  Have discussed with the patient and available family all diagnostics and treatments performed thus far and all questions were answered to the best of my ability. The patient demonstrates understanding and agreement with plan.    [PR]    Clinical Course User Index [PR] Merlyn Lot, MD     As part of my medical decision making, I reviewed the following data within the [facility] notes reviewed and incorporated, Labs reviewed, notes from prior ED visits and [place] Controlled Substance Database   ____________________________________________   FINAL CLINICAL IMPRESSION(S) / ED DIAGNOSES  Final diagnoses:  COPD exacerbation ([place])  Shortness of breath      NEW MEDICATIONS STARTED DURING THIS VISIT:  New Prescriptions   No medications on file     Note:  This document was prepared using Dragon voice recognition software and may include unintentional dictation errors.    Merlyn Lot, MD 12/22/17 1444

## 2017-12-22 NOTE — ED Triage Notes (Signed)
Pt arrives via ems from home with complaints of SOB start 2 am this morning. Pt has labored breathing on arrival with diminished breath sounds in lower lobes with expiratory wheezing . Pt alert and oriented x 4, able to speak in complete sentences but increases SOB.

## 2018-02-13 ENCOUNTER — Other Ambulatory Visit: Payer: Self-pay

## 2018-02-13 ENCOUNTER — Emergency Department
Admission: EM | Admit: 2018-02-13 | Discharge: 2018-02-13 | Disposition: A | Discharge status: Home / Self Care | Payer: Medicare Other | Attending: Student in an Organized Health Care Education/Training Program | Admitting: Student in an Organized Health Care Education/Training Program

## 2018-02-13 ENCOUNTER — Emergency Department: Payer: Medicare Other

## 2018-02-13 DIAGNOSIS — I10 Essential (primary) hypertension: Secondary | ICD-10-CM | POA: Insufficient documentation

## 2018-02-13 DIAGNOSIS — R0602 Shortness of breath: Secondary | ICD-10-CM | POA: Diagnosis present

## 2018-02-13 DIAGNOSIS — Z950 Presence of cardiac pacemaker: Secondary | ICD-10-CM | POA: Insufficient documentation

## 2018-02-13 DIAGNOSIS — F17210 Nicotine dependence, cigarettes, uncomplicated: Secondary | ICD-10-CM | POA: Insufficient documentation

## 2018-02-13 DIAGNOSIS — J441 Chronic obstructive pulmonary disease with (acute) exacerbation: Secondary | ICD-10-CM | POA: Insufficient documentation

## 2018-02-13 DIAGNOSIS — Z79899 Other long term (current) drug therapy: Secondary | ICD-10-CM | POA: Diagnosis not present

## 2018-02-13 LAB — COMPREHENSIVE METABOLIC PANEL
ALT: 13 U/L (ref 0–44)
AST: 16 U/L (ref 15–41)
Albumin: 3.6 g/dL (ref 3.5–5.0)
Alkaline Phosphatase: 60 U/L (ref 38–126)
Anion gap: 5 (ref 5–15)
BILIRUBIN TOTAL: 1.3 mg/dL — AB (ref 0.3–1.2)
BUN: 15 mg/dL (ref 8–23)
CO2: 28 mmol/L (ref 22–32)
Calcium: 8.6 mg/dL — ABNORMAL LOW (ref 8.9–10.3)
Chloride: 105 mmol/L (ref 98–111)
Creatinine, Ser: 1.09 mg/dL (ref 0.61–1.24)
GFR calc non Af Amer: >60 mL/min (ref >60)
Glucose, Bld: 92 mg/dL (ref 70–99)
POTASSIUM: 3.9 mmol/L (ref 3.5–5.1)
Sodium: 138 mmol/L (ref 135–145)
TOTAL PROTEIN: 6.5 g/dL (ref 6.5–8.1)

## 2018-02-13 LAB — CBC WITH DIFFERENTIAL/PLATELET
BASOS ABS: 0 10*3/uL (ref 0–0.1)
BASOS PCT: 1 %
Eosinophils Absolute: 0.1 10*3/uL (ref 0–0.7)
Eosinophils Relative: 3 %
HEMATOCRIT: 40.9 % (ref 40.0–52.0)
Hemoglobin: 13.4 g/dL (ref 13.0–18.0)
LYMPHS PCT: 29 %
Lymphs Abs: 1 10*3/uL (ref 1.0–3.6)
MCH: 31.3 pg (ref 26.0–34.0)
MCHC: 32.8 g/dL (ref 32.0–36.0)
MCV: 95.2 fL (ref 80.0–100.0)
MONO ABS: 0.4 10*3/uL (ref 0.2–1.0)
Monocytes Relative: 12 %
NEUTROS ABS: 1.9 10*3/uL (ref 1.4–6.5)
Neutrophils Relative %: 55 %
Platelets: 148 10*3/uL — ABNORMAL LOW (ref 150–440)
RBC: 4.29 MIL/uL — ABNORMAL LOW (ref 4.40–5.90)
RDW: 14.3 % (ref 11.5–14.5)
WBC: 3.3 10*3/uL — ABNORMAL LOW (ref 3.8–10.6)

## 2018-02-13 LAB — TROPONIN I: Troponin I: <0.03 ng/mL (ref <0.03)

## 2018-02-13 MED ORDER — PREDNISONE 20 MG PO TABS: 40.0000 mg | ORAL_TABLET | Freq: Every day | ORAL | 0 refills | Status: AC

## 2018-02-13 MED ORDER — IPRATROPIUM-ALBUTEROL 0.5-2.5 (3) MG/3ML IN SOLN
3.0000 mL | Freq: Once | RESPIRATORY_TRACT | Status: AC
  Administered 2018-02-13: 3 mL via RESPIRATORY_TRACT
  Filled 2018-02-13: qty 3

## 2018-02-13 MED ORDER — PREDNISONE 20 MG PO TABS
60.0000 mg | ORAL_TABLET | Freq: Once | ORAL | Status: AC
  Administered 2018-02-13: 60 mg via ORAL
  Filled 2018-02-13: qty 3

## 2018-02-13 MED ORDER — ALBUTEROL SULFATE HFA 108 (90 BASE) MCG/ACT IN AERS: 2.0000 | INHALATION_SPRAY | Freq: Four times a day (QID) | RESPIRATORY_TRACT | 1 refills | Status: DC | PRN

## 2018-02-13 MED ORDER — BUDESONIDE-FORMOTEROL FUMARATE 160-4.5 MCG/ACT IN AERO: 2.0000 | INHALATION_SPRAY | Freq: Two times a day (BID) | RESPIRATORY_TRACT | 2 refills | Status: DC

## 2018-02-13 NOTE — ED Notes (Signed)
Report given to [facility] RN

## 2018-02-13 NOTE — ED Notes (Signed)
Pt requested blanket and water. Both provided after clearing with EDP

## 2018-02-13 NOTE — ED Provider Notes (Signed)
[facility] Emergency Department Provider Note    First MD Initiated Contact with Patient 02/13/18 1006     (approximate)  I have reviewed the triage vital signs and the nursing notes.   HISTORY  Chief Complaint Shortness of Breath    HPI James Dickerson is a 77 y.o. male history of COPD cardiomegaly status post pacemaker presents the ER with worsening shortness of breath that started this morning.  Patient ran out of his Symbicort inhaler and woke up with wheezing shortness of breath.  Does have oxygen at home as needed.  Denies any chest pain.  Called EMS due to respiratory distress and was given nebulizers with some improvement in symptoms but still feels some shortness of breath and wheeze.  Denies any recent antibiotics.  No cough.  Is not been on any antibiotics or steroids since I last saw the patient at the end of July.  Denies any nausea or vomiting.  No lower extremity swelling.    Past Medical History:  Diagnosis Date  . Back pain   . Cancer [facility])    prostate  . Cardiomegaly   . Cystic kidney disease   . Depressive disorder   . GERD (gastroesophageal reflux disease)   . High cholesterol   . History of prostate cancer   . Hypertension   . Thyroid nodule    No family history on file. Past Surgical History:  Procedure Laterality Date  . APPENDECTOMY  1971  . PACEMAKER INSERTION N/A 08/06/2016   Procedure: INSERTION PACEMAKER;  Surgeon: Isaias Cowman, MD;  Location: [facility] ORS;  Service: Cardiovascular;  Laterality: N/A;   Patient Active Problem List   Diagnosis Date Noted  . Respiratory failure ([place]) 07/11/2017  . Acute respiratory failure ([place]) 07/10/2017  . CAP (community acquired pneumonia) 07/10/2017  . Influenza A 07/10/2017  . COPD exacerbation ([place]) 07/10/2017  . HLD (hyperlipidemia) 03/09/2017  . Essential hypertension 08/14/2016  . 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 Bradycardia 08/05/2016      Prior  to Admission medications   Medication Sig Start Date End Date Taking? Authorizing Provider  albuterol (PROVENTIL HFA;VENTOLIN HFA) 108 (90 Base) MCG/ACT inhaler Inhale 2 puffs into the lungs every 6 (six) hours as needed for wheezing or shortness of breath. 02/13/18   Merlyn Lot, MD  atorvastatin (LIPITOR) 40 MG tablet Take 40 mg by mouth at bedtime.    [provider]  budesonide-formoterol (SYMBICORT) 160-4.5 MCG/ACT inhaler Inhale 2 puffs into the lungs 2 (two) times daily. 02/13/18   Merlyn Lot, MD  donepezil (ARICEPT) 5 MG tablet Take 5 mg by mouth at bedtime.    [provider]  nicotine (NICODERM CQ) 21 mg/24hr patch Place 1 patch (21 mg total) onto the skin daily. Patient not taking: Reported on 12/22/2017 07/11/17   Bettey Costa, MD  predniSONE (DELTASONE) 20 MG tablet Take 2 tablets (40 mg total) by mouth daily for 5 days. 02/13/18 02/18/18  Merlyn Lot, MD  tamsulosin (FLOMAX) 0.4 MG CAPS capsule Take 0.4 mg by mouth daily.    [provider]  tiotropium (SPIRIVA) 18 MCG inhalation capsule Place 18 mcg into inhaler and inhale daily.    [provider]    Allergies Patient has no known allergies.    Social History Social History   Tobacco Use  . Smoking status: Current Every Day Smoker  . Smokeless tobacco: Never Used  Substance Use Topics  . Alcohol use: No  . Drug use:  No    Review of Systems Patient denies headaches, rhinorrhea, blurry vision, numbness, shortness of breath, chest pain, edema, cough, abdominal pain, nausea, vomiting, diarrhea, dysuria, fevers, rashes or hallucinations unless otherwise stated above in HPI. ____________________________________________   PHYSICAL EXAM:  VITAL SIGNS: Vitals:   02/13/18 1200 02/13/18 1230  BP: (!) 157/84 (!) 142/90  Pulse: (!) 59 63  Resp: 13 18  SpO2: 97% 96%    Constitutional: Alert and oriented.  Eyes: Conjunctivae are normal.  Head: Atraumatic. Nose: No  congestion/rhinnorhea. Mouth/Throat: Mucous membranes are moist.   Neck: No stridor. Painless ROM.  Cardiovascular: Normal rate, regular rhythm. Grossly normal heart sounds.  Good peripheral circulation. Respiratory: Normal respiratory effort.  No retractions. Scattered faint wheeze with diminished breathsounds bilaterally Gastrointestinal: Soft and nontender. No distention. No abdominal bruits. No CVA tenderness. Genitourinary:  Musculoskeletal: No lower extremity tenderness nor edema.  No joint effusions. Neurologic:  Normal speech and language. No gross focal neurologic deficits are appreciated. No facial droop Skin:  Skin is warm, dry and intact. No rash noted. Psychiatric: Mood and affect are normal. Speech and behavior are normal.  ____________________________________________   LABS (all labs ordered are listed, but only abnormal results are displayed)  Results for orders placed or performed during the hospital encounter of 02/13/18 (from the past 24 hour(s))  CBC with Differential/Platelet     Status: Abnormal   Collection Time: 02/13/18 11:08 AM  Result Value Ref Range   WBC 3.3 (L) 3.8 - 10.6 K/uL   RBC 4.29 (L) 4.40 - 5.90 MIL/uL   Hemoglobin 13.4 13.0 - 18.0 g/dL   HCT 40.9 40.0 - 52.0 %   MCV 95.2 80.0 - 100.0 fL   MCH 31.3 26.0 - 34.0 pg   MCHC 32.8 32.0 - 36.0 g/dL   RDW 14.3 11.5 - 14.5 %   Platelets 148 (L) 150 - 440 K/uL   Neutrophils Relative % 55 %   Neutro Abs 1.9 1.4 - 6.5 K/uL   Lymphocytes Relative 29 %   Lymphs Abs 1.0 1.0 - 3.6 K/uL   Monocytes Relative 12 %   Monocytes Absolute 0.4 0.2 - 1.0 K/uL   Eosinophils Relative 3 %   Eosinophils Absolute 0.1 0 - 0.7 K/uL   Basophils Relative 1 %   Basophils Absolute 0.0 0 - 0.1 K/uL  Comprehensive metabolic panel     Status: Abnormal   Collection Time: 02/13/18 11:08 AM  Result Value Ref Range   Sodium 138 135 - 145 mmol/L   Potassium 3.9 3.5 - 5.1 mmol/L   Chloride 105 98 - 111 mmol/L   CO2 28 22 - 32  mmol/L   Glucose, Bld 92 70 - 99 mg/dL   BUN 15 8 - 23 mg/dL   Creatinine, Ser 1.09 0.61 - 1.24 mg/dL   Calcium 8.6 (L) 8.9 - 10.3 mg/dL   Total Protein 6.5 6.5 - 8.1 g/dL   Albumin 3.6 3.5 - 5.0 g/dL   AST 16 15 - 41 U/L   ALT 13 0 - 44 U/L   Alkaline Phosphatase 60 38 - 126 U/L   Total Bilirubin 1.3 (H) 0.3 - 1.2 mg/dL   GFR calc non Af Amer >60 >60 mL/min   GFR calc Af Amer >60 >60 mL/min   Anion gap 5 5 - 15  Troponin I     Status: None   Collection Time: 02/13/18 11:08 AM  Result Value Ref Range   Troponin I <0.03 <0.03 ng/mL   ____________________________________________  EKG My review and personal interpretation at Time: 10:30   Indication: sob  Rate: 80  Rhythm: a-s v-p Axis: left Other: nonspeicifc st abn, paced rhythm ____________________________________________  RADIOLOGY  I personally reviewed all radiographic images ordered to evaluate for the above acute complaints and reviewed radiology reports and findings.  These findings were personally discussed with the patient.  Please see medical record for radiology report.  ____________________________________________   PROCEDURES  Procedure(s) performed:  Procedures    Critical Care performed: no ____________________________________________   INITIAL IMPRESSION / ASSESSMENT AND PLAN / ED COURSE  Pertinent labs & imaging results that were available during my care of the patient were reviewed by me and considered in my medical decision making (see chart for details).   DDX: Asthma, copd, CHF, pna, ptx, malignancy, Pe, anemia   James Dickerson is a 77 y.o. who presents to the ED with wheezing shortness of breath as described above.  Seems consistent with a recurrence of COPD exacerbation likely worsened because the patient ran out of his Pulmicort medication.  Will give nebulizer treatment as well as steroids.  Blood will be sent for the above differential as he does have extensive past medical history.  Does not  seem cardiac in origin.  Clinical Course as of Feb 14 1331  Sun Feb 13, 2018  1309 She reassessed.  Much improved aeration on exam.  He is not hypoxic with ambulation.  Has home oxygen available.  At this point do believe he stable and appropriate for outpatient follow-up.   [PR]    Clinical Course User Index [PR] Merlyn Lot, MD     As part of my medical decision making, I reviewed the following data within the [place] notes reviewed and incorporated, Labs reviewed, notes from prior ED visits and [place] Controlled Substance Database   ____________________________________________   FINAL CLINICAL IMPRESSION(S) / ED DIAGNOSES  Final diagnoses:  COPD exacerbation ([place])      NEW MEDICATIONS STARTED DURING THIS VISIT:  Discharge Medication List as of 02/13/2018  1:11 PM    START taking these medications   Details  predniSONE (DELTASONE) 20 MG tablet Take 2 tablets (40 mg total) by mouth daily for 5 days., Starting Sun 02/13/2018, Until Fri 02/18/2018, Normal         Note:  This document was prepared using Dragon voice recognition software and may include unintentional dictation errors.    Merlyn Lot, MD 02/13/18 1332

## 2018-02-13 NOTE — ED Notes (Signed)
Pt ambulated with pulse oximetry. Sats remained 96% on room air. HR in 80s. No respiratory distress noted. No chest pain. No dizziness. Pt states he feels good.

## 2018-02-13 NOTE — ED Triage Notes (Signed)
Pt reports to ED with c/c of SoB beginning at 0230 this morning. Pt reports onset of cold like Sx 2 days ago including sinus congestion, productive cough. Denies fever/chills, chest pain. Pt has Hx of COPD, HTN, and has a pacemaker.

## 2018-04-10 NOTE — Progress Notes (Signed)
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  - 1 hospital admission, 2 other ed visits this year for AECOPD.  - Patient asked to continue Symbicort 2 puffs twice daily, rinse mouth after use.  He is asked to increase his activity level. - Use oxygen at 1 L at night. -Patient is interested in getting a portable oxygen concentrator, will check evaluate and see if he qualifies. - Patient declines Prevnar and flu vaccine today.   Nicotinue Abuse.  - Continues to smoke, both 3 cigarettes/day, not really interested in quitting.  Discussed importance of quitting, spent 3 minutes in discussion.   Date: 04/10/2018  MRN# [id number] Crystal Ellwood 1940/07/10   James Dickerson is a 77 y.o. old male seen in consultation for chief complaint of:    Chief Complaint  Patient presents with  . COPD    pt states he uses 02 prn. He is supposed to be on 02 24/7.  Marland Kitchen Shortness of Breath    with exertion  . Cough    white mucus    HPI:   The patient is a 89 with COPD and multiple ED visits and hospital admission for COPD. He has a history of 2nd degree heart block s/p pacemaker in 2018. He has dyspnea on mild exertion such as taking a shower. At last visit he was counseled on smoking cessation, asked to use dulera, increase activity. Ambulatory oxygen was dc, but ONO was ordered.   ONO showed that he required 1L of oxygen. He is using symbicort 2 puffs bid, does not rinse mouth. He uses albuterol about twice per day.  He is using oxygen 1L occasionally.  He continues to smoke 3 cigs per day and does not think that he can quit further.   **Baseline sat at rest on RA was 94% and HR 60. Pt walked 360 feet at moderate pace, mild dyspnea. Sat was 97% and HR 60 **CT chest 07/10/2017>> there is biapical emphysematous changes, bibasilar pneumonia versus atelectatic changes.  Medication:    Current Outpatient Medications:  .  albuterol (PROVENTIL  HFA;VENTOLIN HFA) 108 (90 Base) MCG/ACT inhaler, Inhale 2 puffs into the lungs every 6 (six) hours as needed for wheezing or shortness of breath., Disp: 1 Inhaler, Rfl: 1 .  atorvastatin (LIPITOR) 40 MG tablet, Take 40 mg by mouth at bedtime., Disp: , Rfl:  .  budesonide-formoterol (SYMBICORT) 160-4.5 MCG/ACT inhaler, Inhale 2 puffs into the lungs 2 (two) times daily., Disp: 1 Inhaler, Rfl: 2 .  donepezil (ARICEPT) 5 MG tablet, Take 5 mg by mouth at bedtime., Disp: , Rfl:  .  nicotine (NICODERM CQ) 21 mg/24hr patch, Place 1 patch (21 mg total) onto the skin daily. (Patient not taking: Reported on 12/22/2017), Disp: 28 patch, Rfl: 0 .  tamsulosin (FLOMAX) 0.4 MG CAPS capsule, Take 0.4 mg by mouth daily., Disp: , Rfl:  .  tiotropium (SPIRIVA) 18 MCG inhalation capsule, Place 18 mcg into inhaler and inhale daily., Disp: , Rfl:    Allergies:  Patient has no known allergies.    Review of Systems:  Constitutional: Feels well. Cardiovascular: Denies chest pain, exertional chest pain.  Pulmonary: Denies hemoptysis, pleuritic chest pain.   The remainder of systems were reviewed and were found to be negative other than what is documented in the HPI.    Physical Examination:   VS: BP 112/88 (BP Location: Left Arm, Cuff Size: Normal)   Pulse 88  Resp 16   Ht 5\' 3"  (1.6 m)   Wt 127 lb (57.6 kg)   SpO2 92%   BMI 22.50 kg/m   General Appearance: No distress  Neuro:without focal findings, mental status, speech normal, alert and oriented HEENT: PERRLA, EOM intact Pulmonary: No wheezing, No rales  CardiovascularNormal S1,S2.  No m/r/g.  Abdomen: Benign, Soft, non-tender, No masses Renal:  No costovertebral tenderness  GU:  No performed at this time. Endoc: No evident thyromegaly, no signs of acromegaly or Cushing features Skin:   warm, no rashes, no ecchymosis  Extremities: normal, no cyanosis, clubbing.    LABORATORY PANEL:   CBC No results for input(s): WBC, HGB, HCT, PLT in the last  168 hours. ------------------------------------------------------------------------------------------------------------------  Chemistries  No results for input(s): NA, K, CL, CO2, GLUCOSE, BUN, CREATININE, CALCIUM, MG, AST, ALT, ALKPHOS, BILITOT in the last 168 hours.  Invalid input(s): GFRCGP ------------------------------------------------------------------------------------------------------------------  Cardiac Enzymes No results for input(s): TROPONINI in the last 168 hours. ------------------------------------------------------------  RADIOLOGY:  No results found.     Thank  you for the consultation and for allowing [facility] to assist in the care of your patient. Our recommendations are noted above.  Please contact us if we can be of further service.  Marda Stalker, M.D., F.C.C.P.  Board Certified in Internal Medicine, Pulmonary Medicine, [place], and Sleep Medicine.  [facility] Office Number: [phone]   04/10/2018

## 2018-04-11 ENCOUNTER — Encounter: Payer: Self-pay | Admitting: Internal Medicine

## 2018-04-11 ENCOUNTER — Ambulatory Visit (INDEPENDENT_AMBULATORY_CARE_PROVIDER_SITE_OTHER): Payer: Medicare Other | Admitting: Internal Medicine

## 2018-04-11 VITALS — BP 112/88 | HR 88 | Resp 16 | Ht 63.0 in | Wt 127.0 lb

## 2018-04-11 DIAGNOSIS — J449 Chronic obstructive pulmonary disease, unspecified: Secondary | ICD-10-CM | POA: Diagnosis not present

## 2018-04-11 NOTE — Patient Instructions (Signed)
Make sure you use symbicort 2 puffs twice per day, and rinse your mouth after use.

## 2018-06-15 ENCOUNTER — Observation Stay
Admission: EM | Admit: 2018-06-15 | Discharge: 2018-06-17 | Disposition: A | Discharge status: Home / Self Care | Payer: Medicare Other | Attending: Internal Medicine | Admitting: Internal Medicine

## 2018-06-15 ENCOUNTER — Encounter: Payer: Self-pay | Admitting: Internal Medicine

## 2018-06-15 ENCOUNTER — Emergency Department: Payer: Medicare Other

## 2018-06-15 ENCOUNTER — Other Ambulatory Visit: Payer: Self-pay

## 2018-06-15 DIAGNOSIS — I4891 Unspecified atrial fibrillation: Secondary | ICD-10-CM | POA: Diagnosis not present

## 2018-06-15 DIAGNOSIS — F17200 Nicotine dependence, unspecified, uncomplicated: Secondary | ICD-10-CM | POA: Insufficient documentation

## 2018-06-15 DIAGNOSIS — Z9981 Dependence on supplemental oxygen: Secondary | ICD-10-CM | POA: Insufficient documentation

## 2018-06-15 DIAGNOSIS — I10 Essential (primary) hypertension: Secondary | ICD-10-CM | POA: Insufficient documentation

## 2018-06-15 DIAGNOSIS — Z79899 Other long term (current) drug therapy: Secondary | ICD-10-CM | POA: Insufficient documentation

## 2018-06-15 DIAGNOSIS — J441 Chronic obstructive pulmonary disease with (acute) exacerbation: Secondary | ICD-10-CM | POA: Insufficient documentation

## 2018-06-15 DIAGNOSIS — F329 Major depressive disorder, single episode, unspecified: Secondary | ICD-10-CM | POA: Diagnosis not present

## 2018-06-15 DIAGNOSIS — K219 Gastro-esophageal reflux disease without esophagitis: Secondary | ICD-10-CM | POA: Diagnosis not present

## 2018-06-15 DIAGNOSIS — Q619 Cystic kidney disease, unspecified: Secondary | ICD-10-CM | POA: Insufficient documentation

## 2018-06-15 DIAGNOSIS — I4892 Unspecified atrial flutter: Secondary | ICD-10-CM | POA: Diagnosis not present

## 2018-06-15 DIAGNOSIS — J9600 Acute respiratory failure, unspecified whether with hypoxia or hypercapnia: Secondary | ICD-10-CM | POA: Diagnosis present

## 2018-06-15 DIAGNOSIS — E785 Hyperlipidemia, unspecified: Secondary | ICD-10-CM | POA: Diagnosis not present

## 2018-06-15 DIAGNOSIS — J9621 Acute and chronic respiratory failure with hypoxia: Secondary | ICD-10-CM | POA: Diagnosis not present

## 2018-06-15 DIAGNOSIS — Z7951 Long term (current) use of inhaled steroids: Secondary | ICD-10-CM | POA: Insufficient documentation

## 2018-06-15 HISTORY — DX: Chronic obstructive pulmonary disease, unspecified: J44.9

## 2018-06-15 LAB — MRSA PCR SCREENING: MRSA by PCR: NEGATIVE

## 2018-06-15 LAB — COMPREHENSIVE METABOLIC PANEL
ALK PHOS: 76 U/L (ref 38–126)
ALT: 12 U/L (ref 0–44)
AST: 16 U/L (ref 15–41)
Albumin: 4.2 g/dL (ref 3.5–5.0)
Anion gap: 7 (ref 5–15)
BUN: 21 mg/dL (ref 8–23)
CALCIUM: 9.1 mg/dL (ref 8.9–10.3)
CO2: 28 mmol/L (ref 22–32)
Chloride: 105 mmol/L (ref 98–111)
Creatinine, Ser: 1.24 mg/dL (ref 0.61–1.24)
GFR calc non Af Amer: 55 mL/min — ABNORMAL LOW (ref >60)
Glucose, Bld: 131 mg/dL — ABNORMAL HIGH (ref 70–99)
Potassium: 4.1 mmol/L (ref 3.5–5.1)
SODIUM: 140 mmol/L (ref 135–145)
Total Bilirubin: 1.4 mg/dL — ABNORMAL HIGH (ref 0.3–1.2)
Total Protein: 7.2 g/dL (ref 6.5–8.1)

## 2018-06-15 LAB — TROPONIN I: Troponin I: <0.03 ng/mL (ref <0.03)

## 2018-06-15 LAB — CBC WITH DIFFERENTIAL/PLATELET
Abs Immature Granulocytes: 0.02 10*3/uL (ref 0.00–0.07)
Basophils Absolute: 0 10*3/uL (ref 0.0–0.1)
Basophils Relative: 0 %
Eosinophils Absolute: 0 10*3/uL (ref 0.0–0.5)
Eosinophils Relative: 0 %
HCT: 44.9 % (ref 39.0–52.0)
Hemoglobin: 14.2 g/dL (ref 13.0–17.0)
Immature Granulocytes: 0 %
Lymphocytes Relative: 7 %
Lymphs Abs: 0.6 10*3/uL — ABNORMAL LOW (ref 0.7–4.0)
MCH: 30.5 pg (ref 26.0–34.0)
MCHC: 31.6 g/dL (ref 30.0–36.0)
MCV: 96.6 fL (ref 80.0–100.0)
Monocytes Absolute: 0.5 10*3/uL (ref 0.1–1.0)
Monocytes Relative: 6 %
Neutro Abs: 6.7 10*3/uL (ref 1.7–7.7)
Neutrophils Relative %: 87 %
Platelets: 175 10*3/uL (ref 150–400)
RBC: 4.65 MIL/uL (ref 4.22–5.81)
RDW: 13.4 % (ref 11.5–15.5)
WBC: 7.8 10*3/uL (ref 4.0–10.5)
nRBC: 0 % (ref 0.0–0.2)

## 2018-06-15 LAB — LACTIC ACID, PLASMA
LACTIC ACID, VENOUS: 2 mmol/L — AB (ref 0.5–1.9)
Lactic Acid, Venous: 1.4 mmol/L (ref 0.5–1.9)

## 2018-06-15 LAB — INFLUENZA PANEL BY PCR (TYPE A & B)
INFLBPCR: NEGATIVE
Influenza A By PCR: NEGATIVE

## 2018-06-15 LAB — BRAIN NATRIURETIC PEPTIDE: B Natriuretic Peptide: 58 pg/mL (ref 0.0–100.0)

## 2018-06-15 LAB — GLUCOSE, CAPILLARY: Glucose-Capillary: 134 mg/dL — ABNORMAL HIGH (ref 70–99)

## 2018-06-15 MED ORDER — ONDANSETRON HCL 4 MG/2ML IJ SOLN: 4.0000 mg | Freq: Four times a day (QID) | INTRAMUSCULAR | Status: DC | PRN

## 2018-06-15 MED ORDER — GUAIFENESIN ER 600 MG PO TB12
600.0000 mg | ORAL_TABLET | Freq: Two times a day (BID) | ORAL | Status: DC
  Administered 2018-06-15 – 2018-06-17 (×3): 600 mg via ORAL
  Filled 2018-06-15 (×3): qty 1

## 2018-06-15 MED ORDER — ACETAMINOPHEN 325 MG PO TABS: 650.0000 mg | ORAL_TABLET | Freq: Once | ORAL | Status: DC

## 2018-06-15 MED ORDER — ENOXAPARIN SODIUM 40 MG/0.4ML ~~LOC~~ SOLN
40.0000 mg | SUBCUTANEOUS | Status: DC
  Administered 2018-06-15 – 2018-06-16 (×2): 40 mg via SUBCUTANEOUS
  Filled 2018-06-15 (×2): qty 0.4

## 2018-06-15 MED ORDER — VITAMIN D 25 MCG (1000 UNIT) PO TABS
2000.0000 [IU] | ORAL_TABLET | Freq: Every day | ORAL | Status: DC
  Administered 2018-06-15 – 2018-06-17 (×3): 2000 [IU] via ORAL
  Filled 2018-06-15 (×3): qty 2

## 2018-06-15 MED ORDER — LEVALBUTEROL HCL 0.63 MG/3ML IN NEBU
0.6300 mg | INHALATION_SOLUTION | Freq: Four times a day (QID) | RESPIRATORY_TRACT | Status: DC
  Administered 2018-06-15 – 2018-06-16 (×3): 0.63 mg via RESPIRATORY_TRACT
  Filled 2018-06-15 (×2): qty 3

## 2018-06-15 MED ORDER — ATORVASTATIN CALCIUM 20 MG PO TABS
40.0000 mg | ORAL_TABLET | Freq: Every day | ORAL | Status: DC
  Administered 2018-06-16: 21:00:00 40 mg via ORAL
  Filled 2018-06-15: qty 2

## 2018-06-15 MED ORDER — TIOTROPIUM BROMIDE MONOHYDRATE 18 MCG IN CAPS
18.0000 ug | ORAL_CAPSULE | Freq: Every day | RESPIRATORY_TRACT | Status: DC
  Administered 2018-06-15: 18 ug via RESPIRATORY_TRACT
  Filled 2018-06-15: qty 5

## 2018-06-15 MED ORDER — SODIUM CHLORIDE 0.9 % IV SOLN: 250.0000 mL | INTRAVENOUS | Status: DC | PRN

## 2018-06-15 MED ORDER — ACETAMINOPHEN 325 MG PO TABS
ORAL_TABLET | ORAL | Status: AC
  Filled 2018-06-15: qty 2

## 2018-06-15 MED ORDER — IRBESARTAN 75 MG PO TABS
37.5000 mg | ORAL_TABLET | Freq: Every day | ORAL | Status: DC
  Administered 2018-06-16 – 2018-06-17 (×2): 37.5 mg via ORAL
  Filled 2018-06-15 (×2): qty 0.5

## 2018-06-15 MED ORDER — ONDANSETRON HCL 4 MG PO TABS: 4.0000 mg | ORAL_TABLET | Freq: Four times a day (QID) | ORAL | Status: DC | PRN

## 2018-06-15 MED ORDER — ONDANSETRON HCL 4 MG/2ML IJ SOLN
INTRAMUSCULAR | Status: AC
  Administered 2018-06-15: 4 mg via INTRAVENOUS
  Filled 2018-06-15: qty 2

## 2018-06-15 MED ORDER — NICOTINE 21 MG/24HR TD PT24
21.0000 mg | MEDICATED_PATCH | Freq: Every day | TRANSDERMAL | Status: DC
  Administered 2018-06-17: 02:00:00 21 mg via TRANSDERMAL
  Filled 2018-06-15: qty 1

## 2018-06-15 MED ORDER — METHYLPREDNISOLONE SODIUM SUCC 125 MG IJ SOLR
60.0000 mg | Freq: Four times a day (QID) | INTRAMUSCULAR | Status: DC
  Administered 2018-06-16 (×2): 60 mg via INTRAVENOUS
  Filled 2018-06-15 (×3): qty 2

## 2018-06-15 MED ORDER — AZITHROMYCIN 500 MG PO TABS
500.0000 mg | ORAL_TABLET | Freq: Every day | ORAL | Status: DC
  Administered 2018-06-17: 500 mg via ORAL
  Filled 2018-06-15 (×3): qty 1

## 2018-06-15 MED ORDER — ACETAMINOPHEN 650 MG RE SUPP: 650.0000 mg | Freq: Four times a day (QID) | RECTAL | Status: DC | PRN

## 2018-06-15 MED ORDER — ACETAMINOPHEN 325 MG PO TABS: 650.0000 mg | ORAL_TABLET | Freq: Four times a day (QID) | ORAL | Status: DC | PRN

## 2018-06-15 MED ORDER — DONEPEZIL HCL 5 MG PO TABS
5.0000 mg | ORAL_TABLET | Freq: Every day | ORAL | Status: DC
  Administered 2018-06-15 – 2018-06-16 (×2): 5 mg via ORAL
  Filled 2018-06-15 (×3): qty 1

## 2018-06-15 MED ORDER — SODIUM CHLORIDE 0.9% FLUSH: 3.0000 mL | INTRAVENOUS | Status: DC | PRN

## 2018-06-15 MED ORDER — ORAL CARE MOUTH RINSE
15.0000 mL | Freq: Two times a day (BID) | OROMUCOSAL | Status: DC
  Administered 2018-06-16: 15 mL via OROMUCOSAL

## 2018-06-15 MED ORDER — ONDANSETRON HCL 4 MG/2ML IJ SOLN
4.0000 mg | Freq: Once | INTRAMUSCULAR | Status: AC
  Administered 2018-06-15: 4 mg via INTRAVENOUS

## 2018-06-15 MED ORDER — ALBUTEROL SULFATE (2.5 MG/3ML) 0.083% IN NEBU
5.0000 mg | INHALATION_SOLUTION | Freq: Once | RESPIRATORY_TRACT | Status: AC
  Administered 2018-06-15: 5 mg via RESPIRATORY_TRACT
  Filled 2018-06-15: qty 6

## 2018-06-15 MED ORDER — SODIUM CHLORIDE 0.9% FLUSH
3.0000 mL | Freq: Two times a day (BID) | INTRAVENOUS | Status: DC
  Administered 2018-06-15 – 2018-06-17 (×4): 3 mL via INTRAVENOUS

## 2018-06-15 MED ORDER — CHLORHEXIDINE GLUCONATE 0.12 % MT SOLN
15.0000 mL | Freq: Two times a day (BID) | OROMUCOSAL | Status: DC
  Administered 2018-06-16 – 2018-06-17 (×4): 15 mL via OROMUCOSAL
  Filled 2018-06-15 (×4): qty 15

## 2018-06-15 MED ORDER — BUDESONIDE 0.25 MG/2ML IN SUSP
0.2500 mg | Freq: Two times a day (BID) | RESPIRATORY_TRACT | Status: DC
  Administered 2018-06-15 – 2018-06-17 (×4): 0.25 mg via RESPIRATORY_TRACT
  Filled 2018-06-15 (×4): qty 2

## 2018-06-15 NOTE — Progress Notes (Signed)
Patient transported on Bipap from ED to ICU room 18 without complication. Report given to ICU RRT.

## 2018-06-15 NOTE — ED Notes (Signed)
Patient arrives from home with c/o SOB that started today. Hx COPD. Wheezing noted throughout. EMS gave 1 duoneb, 1 albuterol tx, 125mg  solumedrol. 20g in RAC.

## 2018-06-15 NOTE — Progress Notes (Signed)
eLink Physician-Brief Progress Note Patient Name: James Dickerson DOB: [birth year] MRN: [id number]   Date of Service  06/15/2018  HPI/Events of Note  Acute exacerbation of COPD with hypoxemic/ hypercapnic respiratory failure on BIPAP. Pt continues to smoke.  eICU Interventions  Wean BIPAP as tolerated, standard COPD Rx, patient is on empiric Zithromax.        Kerry Kass Layci Stenglein 06/15/2018, 9:10 PM

## 2018-06-15 NOTE — ED Notes (Signed)
Patient 88% on 2 LNC, placed on 4LNC at this time.

## 2018-06-15 NOTE — Consult Note (Signed)
Name: James Dickerson MRN: [id number] DOB: June 24, 1940    ADMISSION DATE:  06/15/2018 CONSULTATION DATE: 06/15/2018  REFERRING MD : Dr. Posey Pronto   CHIEF COMPLAINT: Shortness of Breath   BRIEF PATIENT DESCRIPTION:  78 yo male admitted with acute on chronic hypoxic respiratory failure secondary to AECOPD requiring Bipap  SIGNIFICANT EVENTS  01/22-Pt admitted to the stepdown unit on Bipap   HISTORY OF PRESENT ILLNESS:   This is a 78 yo male with a PMH of Thyroid Nodule, HTN, Prostate Cancer, Hypercholesteremia, GERD, Depressive Disorder, Cystic Kidney Disease, COPD, Chronic Home O2, Cardiomegaly, and Chronic Back Pain.  He presented to [facility] ER on 01/22 with c/o shortness of breath, onset of symptoms 1-2 days prior to presentation.  Upon arrival to the ER pts O2 sats were 88% on RA with wheezing throughout (he wears chronic home O2), he was subsequently placed on 2L O2 via nasal canula.  He received 1 duoneb, 1 albuterol treatment, and 125 mg iv solumedrol.  He remained hypoxic in respiratory distress, therefore pt placed on Bipap.  CXR negative.  Lab results revealed BNP 58, troponin <0.03, lactic acid 2.0, and vbg pH 7.33/pCO2 56.  He was subsequently admitted to the stepdown unit for additional workup and treatment.    PAST MEDICAL HISTORY :   has a past medical history of Back pain, Cancer ([place]), Cardiomegaly, COPD (chronic obstructive pulmonary disease) ([place]), Cystic kidney disease, Depressive disorder, GERD (gastroesophageal reflux disease), High cholesterol, History of prostate cancer, Hypertension, and Thyroid nodule.  has a past surgical history that includes Appendectomy (1971) and Pacemaker insertion (N/A, 08/06/2016). Prior to Admission medications   Medication Sig Start Date End Date Taking? Authorizing Provider  atorvastatin (LIPITOR) 40 MG tablet Take 40 mg by mouth at bedtime.   Yes [provider]  budesonide-formoterol (SYMBICORT) 160-4.5 MCG/ACT inhaler Inhale 2 puffs into  the lungs 2 (two) times daily. 02/13/18  Yes Merlyn Lot, MD  donepezil (ARICEPT) 5 MG tablet Take 5 mg by mouth at bedtime.   Yes [provider]  tiotropium (SPIRIVA) 18 MCG inhalation capsule Place 18 mcg into inhaler and inhale daily.   Yes [provider]  valsartan (DIOVAN) 160 MG tablet Take 160 mg by mouth daily. 05/04/18  Yes [provider]  albuterol (PROVENTIL HFA;VENTOLIN HFA) 108 (90 Base) MCG/ACT inhaler Inhale 2 puffs into the lungs every 6 (six) hours as needed for wheezing or shortness of breath. 02/13/18   Merlyn Lot, MD  Cholecalciferol (VITAMIN D3) 50 MCG (2000 UT) CHEW Chew 2,000 Units by mouth daily.    [provider]  nicotine (NICODERM CQ) 21 mg/24hr patch Place 1 patch (21 mg total) onto the skin daily. Patient not taking: Reported on 06/15/2018 07/11/17   Bettey Costa, MD   No Known Allergies  FAMILY HISTORY:  family history is not on file. SOCIAL HISTORY:  reports that he has been smoking. He has never used smokeless tobacco. He reports that he does not drink alcohol or use drugs.  REVIEW OF SYSTEMS: Positives in BOLD>> Pt denies all complaints Constitutional: Negative for fever, chills, weight loss, malaise/fatigue and diaphoresis.  HENT: Negative for hearing loss, ear pain, nosebleeds, congestion, sore throat, neck pain, tinnitus and ear discharge.   Eyes: Negative for blurred vision, double vision, photophobia, pain, discharge and redness.  Respiratory: Negative for cough, hemoptysis, sputum production, shortness of breath, wheezing and stridor.   Cardiovascular: Negative for chest pain, palpitations, orthopnea, claudication, leg swelling and PND.  Gastrointestinal: Negative for heartburn,  nausea, vomiting, abdominal pain, diarrhea, constipation, blood in stool and melena.  Genitourinary: Negative for dysuria, urgency, frequency, hematuria and flank pain.  Musculoskeletal: Negative for myalgias, back pain, joint pain  and falls.  Skin: Negative for itching and rash.  Neurological: Negative for dizziness, tingling, tremors, sensory change, speech change, focal weakness, seizures, loss of consciousness, weakness and headaches.  Endo/Heme/Allergies: Negative for environmental allergies and polydipsia. Does not bruise/bleed easily.  SUBJECTIVE:  Pt denies shortness of breath, cough, sputum production, wheezing Denies chest pain, fever, chills Weaned off BiPAP, on 2L [place]  VITAL SIGNS: Temp:  [97.3 F (36.3 C)] 97.3 F (36.3 C) (01/22 1602) Pulse Rate:  [103-125] 103 (01/22 1800) Resp:  [18-26] 19 (01/22 1800) BP: (128-173)/(84-133) 128/84 (01/22 1800) SpO2:  [88 %-97 %] 97 % (01/22 1800) FiO2 (%):  [60 %] 60 % (01/22 1548) Weight:  [63.5 kg] 63.5 kg (01/22 1600)  PHYSICAL EXAMINATION: General:  Acutely ill appearing male, sitting in bed, on [place], in NAD Neuro:  Awake, A&Ox4, follows commands, no focal deficits, speech clear HEENT:  Atraumatic, normocephalic, neck supple, no JVD Cardiovascular:  Paced rhythm, no M/R/G, 2+ pulses throughout Lungs:  Clear to auscultation bilaterally, no wheezing, even, nonlabored, normal effort Abdomen:  Soft, nontender, nondistended, no guarding or rebound tenderness, BS+ x4 Musculoskeletal:  Normal bulk and tone, no deformities Skin:  Warm/dry.  No obvious rashes, lesions, or ulcerations  Recent Labs  Lab 06/15/18 1603  NA 140  K 4.1  CL 105  CO2 28  BUN 21  CREATININE 1.24  GLUCOSE 131*   Recent Labs  Lab 06/15/18 1722  HGB 14.2  HCT 44.9  WBC 7.8  PLT 175   Dg Chest 1 View  Result Date: 06/15/2018 CLINICAL DATA:  Hypoxia, dyspnea and wheeze EXAM: CHEST  1 VIEW COMPARISON:  02/13/2018 FINDINGS: Stable appearance of the patient's left-sided pacemaker apparatus with leads in the right atrium right ventricle. Heart size is top-normal. Tortuous atherosclerotic aorta without aneurysm. Pulmonary hyperinflation consistent with COPD. No acute pulmonary  consolidation or CHF. No effusion or pneumothorax. Remote bilateral rib fractures. IMPRESSION: 1. 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 2. Bilateral chronic rib fractures. 3. Aortic atherosclerosis. Electronically Signed   By: Ashley Royalty M.D.   On: 06/15/2018 17:00    ASSESSMENT / PLAN:  Acute on chronic hypoxic respiratory failure secondary to AECOPD Prn Bipap for dyspnea and/or hypoxia , wean as tolerated Supplemental O2 as needed to maintain O2 sats 88 to 94% Follow intermittent CXR and ABG as needed Scheduled and prn bronchodilator therapy IV and nebulized steroids  Continue azithromycin  Trend WBC and monitor fever curve  Follow cultures     DISPOSITION: Stepdown GOALS OF CARE: Full Code VTE px: subq lovenox  UPDATES: Updated pt at bedside 06/16/18  Darel Hong, [facility] Pager: [phone] Cell: [phone]

## 2018-06-15 NOTE — ED Notes (Signed)
Patient 88% on RA, patient supposed to be on oxygen continously but does not know how much. Placed on [facility] at this time.

## 2018-06-15 NOTE — H&P (Signed)
[facility] [place] NAME: James Dickerson    MR#:  [id number]  DATE OF BIRTH:  [birth year]  DATE OF ADMISSION:  06/15/2018  PRIMARY CARE PHYSICIAN: Lorelee Market, MD   REQUESTING/REFERRING PHYSICIAN: Schuyler Amor, MD  CHIEF COMPLAINT:   Chief Complaint  Patient presents with  . Shortness of Breath    HISTORY OF PRESENT ILLNESS: James Dickerson  is a 78 y.o. 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  Patient is a very poor historian.  In the emergency room he was very dyspneic and had to be placed on BiPAP   PAST MEDICAL HISTORY:   Past Medical History:  Diagnosis Date  . Back pain   . Cancer [facility])    prostate  . Cardiomegaly   . COPD (chronic obstructive pulmonary disease) ([place])   . Cystic kidney disease   . Depressive disorder   . GERD (gastroesophageal reflux disease)   . High cholesterol   . History of prostate cancer   . Hypertension   . Thyroid nodule     PAST SURGICAL HISTORY:  Past Surgical History:  Procedure Laterality Date  . APPENDECTOMY  1971  . PACEMAKER INSERTION N/A 08/06/2016   Procedure: INSERTION PACEMAKER;  Surgeon: Isaias Cowman, MD;  Location: [facility] ORS;  Service: Cardiovascular;  Laterality: N/A;    SOCIAL HISTORY:  Social History   Tobacco Use  . Smoking status: Current Every Day Smoker  . Smokeless tobacco: Never Used  Substance Use Topics  . Alcohol use: No    FAMILY HISTORY: No family history on file.  DRUG ALLERGIES: No Known Allergies  REVIEW OF SYSTEMS:   CONSTITUTIONAL: No fever, fatigue or weakness.  EYES: No blurred or double vision.  EARS, NOSE, AND THROAT: No tinnitus or ear pain.  RESPIRATORY: No cough, positive shortness of breath, wheezing or hemoptysis.  CARDIOVASCULAR: No chest pain, orthopnea, edema.   GASTROINTESTINAL: No nausea, vomiting, diarrhea or abdominal pain.  GENITOURINARY: No dysuria, hematuria.  ENDOCRINE: No polyuria, nocturia,  HEMATOLOGY: No anemia, easy bruising or bleeding SKIN: No rash or lesion. MUSCULOSKELETAL: No joint pain or arthritis.   NEUROLOGIC: No tingling, numbness, weakness.  PSYCHIATRY: No anxiety or depression.   MEDICATIONS AT HOME:  Prior to Admission medications   Medication Sig Start Date End Date Taking? Authorizing Provider  atorvastatin (LIPITOR) 40 MG tablet Take 40 mg by mouth at bedtime.   Yes [provider]  budesonide-formoterol (SYMBICORT) 160-4.5 MCG/ACT inhaler Inhale 2 puffs into the lungs 2 (two) times daily. 02/13/18  Yes Merlyn Lot, MD  donepezil (ARICEPT) 5 MG tablet Take 5 mg by mouth at bedtime.   Yes [provider]  tiotropium (SPIRIVA) 18 MCG inhalation capsule Place 18 mcg into inhaler and inhale daily.   Yes [provider]  valsartan (DIOVAN) 160 MG tablet Take 160 mg by mouth daily. 05/04/18  Yes [provider]  albuterol (PROVENTIL HFA;VENTOLIN HFA) 108 (90 Base) MCG/ACT inhaler Inhale 2 puffs into the lungs every 6 (six) hours as needed for wheezing or shortness of breath. 02/13/18   Merlyn Lot, MD  Cholecalciferol (VITAMIN D3) 50 MCG (2000 UT) CHEW Chew 2,000 Units by mouth daily.    [provider]  nicotine (NICODERM CQ) 21 mg/24hr patch Place 1 patch (21 mg total) onto the skin daily. Patient not taking: Reported on 06/15/2018  07/11/17   Bettey Costa, MD      PHYSICAL EXAMINATION:   VITAL SIGNS: Blood pressure 128/84, pulse (!) 103, temperature (!) 97.3 F (36.3 C), temperature source Oral, resp. rate 19, height 5\' 3"  (1.6 m), weight 63.5 kg, SpO2 97 %.  GENERAL:  78 y.o.-year-old patient lying in the bed on BiPAP with some accessory muscle usage EYES: Pupils equal, round, reactive to light and accommodation. No scleral icterus. Extraocular muscles intact.   HEENT: Head atraumatic, normocephalic. Oropharynx and nasopharynx clear.  NECK:  Supple, no jugular venous distention. No thyroid enlargement, no tenderness.  LUNGS: Diminished breath sounds bilaterally with accessory muscle usage CARDIOVASCULAR: S1, S2 normal. No murmurs, rubs, or gallops.  ABDOMEN: Soft, nontender, nondistended. Bowel sounds present. No organomegaly or mass.  EXTREMITIES: No pedal edema, cyanosis, or clubbing.  NEUROLOGIC: Cranial nerves II through XII are intact. Muscle strength 5/5 in all extremities. Sensation intact. Gait not checked.  PSYCHIATRIC: The patient is alert and oriented x 3.  SKIN: No obvious rash, lesion, or ulcer.   LABORATORY PANEL:   CBC Recent Labs  Lab 06/15/18 1722  WBC 7.8  HGB 14.2  HCT 44.9  PLT 175  MCV 96.6  MCH 30.5  MCHC 31.6  RDW 13.4  LYMPHSABS 0.6*  MONOABS 0.5  EOSABS 0.0  BASOSABS 0.0   ------------------------------------------------------------------------------------------------------------------  Chemistries  Recent Labs  Lab 06/15/18 1603  NA 140  K 4.1  CL 105  CO2 28  GLUCOSE 131*  BUN 21  CREATININE 1.24  CALCIUM 9.1  AST 16  ALT 12  ALKPHOS 76  BILITOT 1.4*   ------------------------------------------------------------------------------------------------------------------ estimated creatinine clearance is 39.5 mL/min (by C-G formula based on SCr of 1.24 mg/dL). ------------------------------------------------------------------------------------------------------------------ No results for input(s): TSH, T4TOTAL, T3FREE, THYROIDAB in the last 72 hours.  Invalid input(s): FREET3   Coagulation profile No results for input(s): INR, PROTIME in the last 168 hours. ------------------------------------------------------------------------------------------------------------------- No results for input(s): DDIMER in the last 72  hours. -------------------------------------------------------------------------------------------------------------------  Cardiac Enzymes Recent Labs  Lab 06/15/18 1603  TROPONINI <0.03   ------------------------------------------------------------------------------------------------------------------ Invalid input(s): POCBNP  ---------------------------------------------------------------------------------------------------------------  Urinalysis    Component Value Date/Time   COLORURINE YELLOW (A) 12/17/2015 1017   APPEARANCEUR CLEAR (A) 12/17/2015 1017   APPEARANCEUR Clear 08/09/2011 1744   LABSPEC 1.014 12/17/2015 1017   LABSPEC 1.015 08/09/2011 1744   PHURINE 5.0 12/17/2015 1017   GLUCOSEU NEGATIVE 12/17/2015 1017   GLUCOSEU Negative 08/09/2011 1744   HGBUR NEGATIVE 12/17/2015 1017   BILIRUBINUR NEGATIVE 12/17/2015 1017   BILIRUBINUR Negative 08/09/2011 1744   KETONESUR NEGATIVE 12/17/2015 1017   PROTEINUR NEGATIVE 12/17/2015 1017   NITRITE NEGATIVE 12/17/2015 1017   LEUKOCYTESUR 1+ (A) 12/17/2015 1017   LEUKOCYTESUR 1+ 08/09/2011 1744     RADIOLOGY: Dg Chest 1 View  Result Date: 06/15/2018 CLINICAL DATA:  Hypoxia, dyspnea and wheeze EXAM: CHEST  1 VIEW COMPARISON:  02/13/2018 FINDINGS: Stable appearance of the patient's left-sided pacemaker apparatus with leads in the right atrium right ventricle. Heart size is top-normal. Tortuous atherosclerotic aorta without aneurysm. Pulmonary hyperinflation consistent with COPD. No acute pulmonary consolidation or CHF. No effusion or pneumothorax. Remote bilateral rib fractures. IMPRESSION: 1. 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 2. Bilateral chronic rib fractures. 3. Aortic atherosclerosis. Electronically Signed   By: Ashley Royalty M.D.   On: 06/15/2018 17:00    EKG: Orders placed or performed during the hospital encounter of 06/15/18  . EKG 12-Lead  . EKG 12-Lead  . ED EKG  . ED EKG  IMPRESSION AND PLAN: Patient  78 year old with COPD presenting with shortness of breath  1.  Acute respiratory failure due to acute on chronic COPD exasperation continue BiPAP I have sent a message via haiku to the on-call intensivist waiting to hear back  2.  Acute on chronic COPD exacerbation I will treat with Xopenex nebs due to elevated heart rate We will treat with IV Solu-Medrol Place him on Pulmicort  3.  Hyperlipidemia continue Lipitor  4.  Hypertension continue Diovan  5.  Nicotine abuse smoking cessation provided strongly recommend he stop smoking for this patient nicotine patch will be started    All the records are reviewed and case discussed with ED provider. Management plans discussed with the patient, family and they are in agreement.  CODE STATUS: Code Status History    Date Active Date Inactive Code Status Order ID Comments User Context   07/10/2017 1656 07/11/2017 1703 Full Code [id number]  Idelle Crouch, MD Inpatient   08/05/2016 1644 08/08/2016 1711 Full Code [id number]  Gladstone Lighter, MD ED       TOTAL TIME TAKING CARE OF THIS PATIENT:55 minutes critical care time spent.    Dustin Flock M.D on 06/15/2018 at 6:36 PM  Between 7am to 6pm - Pager - [phone]  After 6pm go to [email or web address] - password [email or web address] [facility]  [phone]  CC: Primary care physician; Lorelee Market, MD

## 2018-06-15 NOTE — ED Provider Notes (Signed)
[facility] Emergency Department Provider Note  ____________________________________________   I have reviewed the triage vital signs and the nursing notes. Where available I have reviewed prior notes and, if possible and indicated, outside hospital notes.    HISTORY  Chief Complaint Shortness of Breath    HPI James Dickerson is a 78 y.o. male who presents today complaining of shortness of breath.,  States is been going on for "maybe a day or 2".  Very poor historian.  Is on home oxygen.  Not sure how much.  Denies fever, denies productive cough, just feels short of breath.  No chest pain. Patient is in respiratory distress, level 5 chart caveat; no further history available due to patient status.   Past Medical History:  Diagnosis Date  . Back pain   . Cancer [facility])    prostate  . Cardiomegaly   . Cystic kidney disease   . Depressive disorder   . GERD (gastroesophageal reflux disease)   . High cholesterol   . History of prostate cancer   . Hypertension   . Thyroid nodule     Patient Active Problem List   Diagnosis Date Noted  . Respiratory failure ([place]) 07/11/2017  . Acute respiratory failure ([place]) 07/10/2017  . CAP (community acquired pneumonia) 07/10/2017  . Influenza A 07/10/2017  . COPD exacerbation ([place]) 07/10/2017  . HLD (hyperlipidemia) 03/09/2017  . Essential hypertension 08/14/2016  . 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 Bradycardia 08/05/2016    Past Surgical History:  Procedure Laterality Date  . APPENDECTOMY  1971  . PACEMAKER INSERTION N/A 08/06/2016   Procedure: INSERTION PACEMAKER;  Surgeon: Isaias Cowman, MD;  Location: [facility] ORS;  Service: Cardiovascular;  Laterality: N/A;    Prior to Admission medications   Medication Sig Start Date End Date Taking? Authorizing Provider  atorvastatin (LIPITOR) 40 MG tablet Take 40 mg by mouth at bedtime.   Yes [provider]  budesonide-formoterol  (SYMBICORT) 160-4.5 MCG/ACT inhaler Inhale 2 puffs into the lungs 2 (two) times daily. 02/13/18  Yes Merlyn Lot, MD  donepezil (ARICEPT) 5 MG tablet Take 5 mg by mouth at bedtime.   Yes [provider]  tiotropium (SPIRIVA) 18 MCG inhalation capsule Place 18 mcg into inhaler and inhale daily.   Yes [provider]  valsartan (DIOVAN) 160 MG tablet Take 160 mg by mouth daily. 05/04/18  Yes [provider]  albuterol (PROVENTIL HFA;VENTOLIN HFA) 108 (90 Base) MCG/ACT inhaler Inhale 2 puffs into the lungs every 6 (six) hours as needed for wheezing or shortness of breath. 02/13/18   Merlyn Lot, MD  Cholecalciferol (VITAMIN D3) 50 MCG (2000 UT) CHEW Chew 2,000 Units by mouth daily.    [provider]  nicotine (NICODERM CQ) 21 mg/24hr patch Place 1 patch (21 mg total) onto the skin daily. Patient not taking: Reported on 06/15/2018 07/11/17   Bettey Costa, MD    Allergies Patient has no known allergies.  No family history on file.  Social History Social History   Tobacco Use  . Smoking status: Current Every Day Smoker  . Smokeless tobacco: Never Used  Substance Use Topics  . Alcohol use: No  . Drug use: No    Review of Systems Constitutional: No fever/chills Eyes: No visual changes. ENT: No sore throat. No stiff neck no neck pain Cardiovascular: Denies chest pain. Respiratory: Denies shortness of breath. Gastrointestinal:   no vomiting.  No diarrhea.  No constipation. Genitourinary: Negative for dysuria. Musculoskeletal: Negative  lower extremity swelling Skin: Negative for rash. Neurological: Negative for severe headaches, focal weakness or numbness.   ____________________________________________   PHYSICAL EXAM:  VITAL SIGNS: ED Triage Vitals  Enc Vitals Group     BP 06/15/18 1614 (!) 173/133     Pulse Rate 06/15/18 1602 (!) 125     Resp 06/15/18 1602 (!) 24     Temp 06/15/18 1602 (!) 97.3 F (36.3 C)     Temp Source 06/15/18  1602 Oral     SpO2 06/15/18 1548 96 %     Weight 06/15/18 1600 140 lb (63.5 kg)     Height 06/15/18 1600 5\' 3"  (1.6 m)     Head Circumference --      Peak Flow --      Pain Score 06/15/18 1559 0     Pain Loc --      Pain Edu? --      Excl. in [place]? --     Constitutional: Alert and oriented.  Patient is in acute respiratory distress, breathing 28-30 times a minute, able to speak in 3 or 4 words Eyes: Conjunctivae are normal Head: Atraumatic HEENT: No congestion/rhinnorhea. Mucous membranes are moist.  Oropharynx non-erythematous Neck:   Nontender with no meningismus, no masses, no stridor Cardiovascular: Normal rate, regular rhythm. Grossly normal heart sounds.  Good peripheral circulation. Respiratory: Creased work of breathing, diffuse wheeze appreciated no rales or rhonchi. Abdominal: Soft and nontender. No distention. No guarding no rebound Back:  There is no focal tenderness or step off.  there is no midline tenderness there are no lesions noted. there is no CVA tenderness Musculoskeletal: No lower extremity tenderness, no upper extremity tenderness. No joint effusions, no DVT signs strong distal pulses no edema Neurologic:  Normal speech and language. No gross focal neurologic deficits are appreciated.  Skin:  Skin is warm, dry and intact. No rash noted. Psychiatric: Mood and affect are normal. Speech and behavior are normal.  ____________________________________________   LABS (all labs ordered are listed, but only abnormal results are displayed)  Labs Reviewed  LACTIC ACID, PLASMA - Abnormal; Notable for the following components:      Result Value   Lactic Acid, Venous 2.0 (*)    All other components within normal limits  COMPREHENSIVE METABOLIC PANEL - Abnormal; Notable for the following components:   Glucose, Bld 131 (*)    Total Bilirubin 1.4 (*)    GFR calc non Af Amer 55 (*)    All other components within normal limits  BLOOD GAS, VENOUS - Abnormal; Notable for the  following components:   Bicarbonate 29.5 (*)    Acid-Base Excess 2.3 (*)    All other components within normal limits  CBC WITH DIFFERENTIAL/PLATELET - Abnormal; Notable for the following components:   Lymphs Abs 0.6 (*)    All other components within normal limits  CULTURE, BLOOD (ROUTINE X 2)  CULTURE, BLOOD (ROUTINE X 2)  INFLUENZA PANEL BY PCR (TYPE A & B)  BRAIN NATRIURETIC PEPTIDE  TROPONIN I  LACTIC ACID, PLASMA  CBC WITH DIFFERENTIAL/PLATELET    Pertinent labs  results that were available during my care of the patient were reviewed by me and considered in my medical decision making (see chart for details). ____________________________________________  EKG  I personally interpreted any EKGs ordered by me or triage Paced rhythm, rate 120 bpm, ____________________________________________  RADIOLOGY  Pertinent labs & imaging results that were available during my care of the patient were reviewed by me and considered  in my medical decision making (see chart for details). If possible, patient and/or family made aware of any abnormal findings.  Dg Chest 1 View  Result Date: 06/15/2018 CLINICAL DATA:  Hypoxia, dyspnea and wheeze EXAM: CHEST  1 VIEW COMPARISON:  02/13/2018 FINDINGS: Stable appearance of the patient's left-sided pacemaker apparatus with leads in the right atrium right ventricle. Heart size is top-normal. Tortuous atherosclerotic aorta without aneurysm. Pulmonary hyperinflation consistent with COPD. No acute pulmonary consolidation or CHF. No effusion or pneumothorax. Remote bilateral rib fractures. IMPRESSION: 1. 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 2. Bilateral chronic rib fractures. 3. Aortic atherosclerosis. Electronically Signed   By: Ashley Royalty M.D.   On: 06/15/2018 17:00   ____________________________________________    PROCEDURES  Procedure(s) performed: None  Procedures  Critical Care performed: CRITICAL CARE Performed by: Schuyler Amor   Total critical care time: 38 minutes  Critical care time was exclusive of separately billable procedures and treating other patients.  Critical care was necessary to treat or prevent imminent or life-threatening deterioration.  Critical care was time spent personally by me on the following activities: development of treatment plan with patient and/or surrogate as well as nursing, discussions with consultants, evaluation of patient's response to treatment, examination of patient, obtaining history from patient or surrogate, ordering and performing treatments and interventions, ordering and review of laboratory studies, ordering and review of radiographic studies, pulse oximetry and re-evaluation of patient's condition.   ____________________________________________   INITIAL IMPRESSION / ASSESSMENT AND PLAN / ED COURSE  Pertinent labs & imaging results that were available during my care of the patient were reviewed by me and considered in my medical decision making (see chart for details).  COPD patient has been admitted multiple times presents today with shortness of breath.  He is in some degree of difficulty initially.  I did place him on BiPAP.  Influenza is negative white count is negative chest x-ray is reassuring, his sats, which I could not get above 89% on nasal cannula at 5 L have gone up very well with BiPAP.  He is much more comfortable and relaxed at this time.  We have given him Solu-Medrol on route, per EMS, and I have given him continuous nebs here.  There is no indication that he has an acute bacterial infection, his chest x-ray does not show it and his white count is normal.  We will admit him however for COPD flare.    ____________________________________________   FINAL CLINICAL IMPRESSION(S) / ED DIAGNOSES  Final diagnoses:  COPD exacerbation ([place])      This chart was dictated using voice recognition software.  Despite best efforts to proofread,  errors  can occur which can change meaning.      Schuyler Amor, MD 06/15/18 [phone]

## 2018-06-16 ENCOUNTER — Encounter: Payer: Self-pay | Admitting: *Deleted

## 2018-06-16 DIAGNOSIS — J9621 Acute and chronic respiratory failure with hypoxia: Secondary | ICD-10-CM | POA: Diagnosis not present

## 2018-06-16 DIAGNOSIS — J9600 Acute respiratory failure, unspecified whether with hypoxia or hypercapnia: Secondary | ICD-10-CM

## 2018-06-16 MED ORDER — IPRATROPIUM-ALBUTEROL 0.5-2.5 (3) MG/3ML IN SOLN
3.0000 mL | Freq: Four times a day (QID) | RESPIRATORY_TRACT | Status: DC
  Administered 2018-06-16 – 2018-06-17 (×5): 3 mL via RESPIRATORY_TRACT
  Filled 2018-06-16 (×4): qty 3

## 2018-06-16 MED ORDER — METHYLPREDNISOLONE SODIUM SUCC 40 MG IJ SOLR: 40.0000 mg | Freq: Two times a day (BID) | INTRAMUSCULAR | Status: DC

## 2018-06-16 MED ORDER — METHYLPREDNISOLONE SODIUM SUCC 40 MG IJ SOLR
40.0000 mg | Freq: Two times a day (BID) | INTRAMUSCULAR | Status: DC
  Administered 2018-06-16 – 2018-06-17 (×2): 40 mg via INTRAVENOUS
  Filled 2018-06-16 (×2): qty 1

## 2018-06-16 NOTE — Care Management Note (Signed)
Case Management Note  Patient Details  Name: James Dickerson MRN: [id number] Date of Birth: Nov 05, 1940  Subjective/Objective:     Patient is from home alone.  He was admitted to ICU for respiratory distress; placed on Bipap and is now on 2L [place].   HX COPD; pacemaker placed approximately 6 mos. ago.   He is on oxygen at home but cannot remember how many liters; he mainly uses oxygen at night.  He states he should probably use it all the time.    Action/Plan: As Patient progresses will assess oxygen needs.  He states he thinks he gets his oxygen through [facility].   He is current with his PCP, Brunetta Genera.  He obtains his prescriptions at [facility] in [place].  Denies difficulties obtaining medications or with accessing medical care.  No issues with housing or obtaining food.  He does not drive; he uses a bike and public transportation.  He does not use ant equipment at home and is currently declining home health.  No further needs identified at this time.  Will continue to follow through DC and assist as needed.          Expected Discharge Date:                  Expected Discharge Plan:  Home/Self Care  In-House Referral:     Discharge planning Services  CM Consult  Post Acute Care Choice:    Choice offered to:     DME Arranged:    DME Agency:     HH Arranged:    HH Agency:     Status of Service:  In process, will continue to follow  If discussed at Long Length of Stay Meetings, dates discussed:    Additional Comments:  Elza Rafter, RN 06/16/2018, 1:24 PM

## 2018-06-16 NOTE — Progress Notes (Signed)
Comfortable on [place] O2. Transfer to [place] floor ordered. PCCM will sign off after transfer  Merton Border, MD PCCM service Mobile [phone] Pager [phone] 06/16/2018 4:06 PM

## 2018-06-16 NOTE — Progress Notes (Signed)
[facility] [place] [facility] NAME: Alfonzia Woolum    MR#:  [id number]  DATE OF BIRTH:  1941-05-18  SUBJECTIVE:   Doing well this morning.  States that his shortness of breath has greatly improved.  No cough, fevers, chills.  Able to be transitioned off BiPAP to nasal cannula.  REVIEW OF SYSTEMS:  Review of Systems  Constitutional: Negative for chills and fever.  HENT: Negative for congestion and sore throat.   Eyes: Negative for blurred vision and double vision.  Respiratory: Positive for shortness of breath. Negative for cough.   Cardiovascular: Negative for chest pain, palpitations and leg swelling.  Gastrointestinal: Negative for nausea and vomiting.  Genitourinary: Negative for dysuria and urgency.  Musculoskeletal: Negative for back pain and neck pain.  Neurological: Negative for dizziness and headaches.  Psychiatric/Behavioral: Negative for depression. The patient is not nervous/anxious.     DRUG ALLERGIES:  No Known Allergies VITALS:  Blood pressure 135/83, pulse 74, temperature (!) 97.5 F (36.4 C), temperature source Axillary, resp. rate (!) 31, height 5\' 3"  (1.6 m), weight 63.5 kg, SpO2 93 %. PHYSICAL EXAMINATION:  Physical Exam  GENERAL:  78 y.o.-year-old patient lying in the bed, in no acute distress EYES: Pupils equal, round, reactive to light and accommodation. No scleral icterus. Extraocular muscles intact.  HEENT: Head atraumatic, normocephalic. Oropharynx and nasopharynx clear.  NECK:  Supple, no jugular venous distention. No thyroid enlargement, no tenderness.  LUNGS: +Diminished breath sounds bilaterally, + occasional expiratory wheeze, nasal cannula in place, normal work of breathing CARDIOVASCULAR: RRR, S1, S2 normal. No murmurs, rubs, or gallops.  ABDOMEN: Soft, nontender, nondistended. Bowel sounds present. No organomegaly or mass.  EXTREMITIES: No pedal edema, cyanosis, or clubbing.  NEUROLOGIC: Cranial nerves II  through XII are intact. Muscle strength 5/5 in all extremities. Sensation intact. Gait not checked.  PSYCHIATRIC: The patient is alert and oriented x 3.  SKIN: No obvious rash, lesion, or ulcer.  LABORATORY PANEL:  Male CBC Recent Labs  Lab 06/15/18 1722  WBC 7.8  HGB 14.2  HCT 44.9  PLT 175   ------------------------------------------------------------------------------------------------------------------ Chemistries  Recent Labs  Lab 06/15/18 1603  NA 140  K 4.1  CL 105  CO2 28  GLUCOSE 131*  BUN 21  CREATININE 1.24  CALCIUM 9.1  AST 16  ALT 12  ALKPHOS 76  BILITOT 1.4*   RADIOLOGY:  Dg Chest 1 View  Result Date: 06/15/2018 CLINICAL DATA:  Hypoxia, dyspnea and wheeze EXAM: CHEST  1 VIEW COMPARISON:  02/13/2018 FINDINGS: Stable appearance of the patient's left-sided pacemaker apparatus with leads in the right atrium right ventricle. Heart size is top-normal. Tortuous atherosclerotic aorta without aneurysm. Pulmonary hyperinflation consistent with COPD. No acute pulmonary consolidation or CHF. No effusion or pneumothorax. Remote bilateral rib fractures. IMPRESSION: 1. 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 2. Bilateral chronic rib fractures. 3. Aortic atherosclerosis. Electronically Signed   By: Ashley Royalty M.D.   On: 06/15/2018 17:00   ASSESSMENT AND PLAN:   Acute on chronic hypoxic respiratory failure- due to COPD exacerbation.  Patient has been transitioned from BiPAP to 2 L O2 by nasal cannula. -Continue Solu-Medrol -Continue azithromycin -Pulmicort nebs -Xopenex and duonebs PRN  Hyperlipidemia-stable -Continue Lipitor  Hypertension- blood pressures normal -Continue home irbesartan  Tobacco use -Tobacco cessation counseling performed by me -Nicotine patch as needed  All the records are reviewed and case discussed with Care Management/Social Worker. Management plans discussed with the patient, family and they are in  agreement.  CODE STATUS: Full  Code  TOTAL TIME TAKING CARE OF THIS PATIENT: 35 minutes.   More than 50% of the time was spent in counseling/coordination of care: YES  POSSIBLE D/C IN 1-2 DAYS, DEPENDING ON CLINICAL CONDITION.   Berna Spare Bonner Larue M.D on 06/16/2018 at 1:03 PM  Between 7am to 6pm - Pager - [phone]  After 6pm go to [email or web address] - Proofreader  [facility]  Office  [phone]  CC: Primary care physician; Lorelee Market, MD  Note: This dictation was prepared with Dragon dictation along with smaller phrase technology. Any transcriptional errors that result from this process are unintentional.

## 2018-06-17 DIAGNOSIS — J9621 Acute and chronic respiratory failure with hypoxia: Secondary | ICD-10-CM | POA: Diagnosis not present

## 2018-06-17 MED ORDER — PREDNISONE 50 MG PO TABS: 50.0000 mg | ORAL_TABLET | Freq: Every day | ORAL | 0 refills | Status: DC

## 2018-06-17 MED ORDER — ATORVASTATIN CALCIUM 40 MG PO TABS: 40.0000 mg | ORAL_TABLET | Freq: Every day | ORAL | 0 refills | Status: DC

## 2018-06-17 MED ORDER — AZITHROMYCIN 250 MG PO TABS: 250.0000 mg | ORAL_TABLET | Freq: Every day | ORAL | 0 refills | Status: AC

## 2018-06-17 NOTE — Discharge Instructions (Signed)
It was so nice to meet you during this hospitalization!  You came into the hospital with shortness of breath. This was due to a worsening of your COPD. We gave you steroids and antibiotics to treat this.   I have prescribed the following medications for you to take at home: 1. Take Azithromycin once daily for 3 days (starting tomorrow)- this is an antibiotic 2. Take Prednisone 50mg  daily with breakfast for 3 days (starting tomorrow)- this is a steroid  You should use your albuterol every 6 hours for the next couple of days to help your lungs heal.  Take care, Dr. Brett Albino

## 2018-06-17 NOTE — Discharge Summary (Signed)
[facility] NAME: James Dickerson    MR#:  [id number]  DATE OF BIRTH:  [birth year]  DATE OF ADMISSION:  06/15/2018   ADMITTING PHYSICIAN: Dustin Flock, MD  DATE OF DISCHARGE: 06/17/18  PRIMARY CARE PHYSICIAN: Lorelee Market, MD   ADMISSION DIAGNOSIS:  COPD exacerbation ([place]) [J44.1] DISCHARGE DIAGNOSIS:  Active Problems:   Acute respiratory failure ([place])  SECONDARY DIAGNOSIS:   Past Medical History:  Diagnosis Date  . Back pain   . Cancer [facility])    prostate  . Cardiomegaly   . COPD (chronic obstructive pulmonary disease) ([place])   . Cystic kidney disease   . Depressive disorder   . GERD (gastroesophageal reflux disease)   . High cholesterol   . History of prostate cancer   . Hypertension   . Thyroid nodule    HOSPITAL COURSE:   James Dickerson is a 78 year old male who presented to the ED with shortness of breath.  He was very dyspneic on arrival and was placed on BiPAP.  He was felt to have a COPD exacerbation.  He was admitted to the ICU.  He was treated with IV Solu-Medrol and azithromycin.  His respiratory status improved.  He was able to be transitioned back to his home 2 L O2.  The day of discharge, he was able to walk around without any shortness of breath.  He was discharged home on a total 5-day course of prednisone and azithromycin.  DISCHARGE CONDITIONS:  COPD Hypertension Hyperlipidemia Tobacco use CONSULTS OBTAINED:  CCM DRUG ALLERGIES:  No Known Allergies DISCHARGE MEDICATIONS:   Allergies as of 06/17/2018   No Known Allergies     Medication List    TAKE these medications   albuterol 108 (90 Base) MCG/ACT inhaler Commonly known as:  PROVENTIL HFA;VENTOLIN HFA Inhale 2 puffs into the lungs every 6 (six) hours as needed for wheezing or shortness of breath.   atorvastatin 40 MG tablet Commonly known as:  LIPITOR Take 1 tablet (40 mg total) by mouth at bedtime.   azithromycin 250 MG tablet Commonly  known as:  ZITHROMAX Take 1 tablet (250 mg total) by mouth daily for 3 days. Start taking on:  June 18, 2018   budesonide-formoterol 160-4.5 MCG/ACT inhaler Commonly known as:  SYMBICORT Inhale 2 puffs into the lungs 2 (two) times daily.   donepezil 5 MG tablet Commonly known as:  ARICEPT Take 5 mg by mouth at bedtime.   nicotine 21 mg/24hr patch Commonly known as:  NICODERM CQ Place 1 patch (21 mg total) onto the skin daily.   predniSONE 50 MG tablet Commonly known as:  DELTASONE Take 1 tablet (50 mg total) by mouth daily with breakfast.   tiotropium 18 MCG inhalation capsule Commonly known as:  SPIRIVA Place 18 mcg into inhaler and inhale daily.   valsartan 160 MG tablet Commonly known as:  DIOVAN Take 160 mg by mouth daily.   Vitamin D3 50 MCG (2000 UT) Chew Chew 2,000 Units by mouth daily.        DISCHARGE INSTRUCTIONS:  1.  Follow-up with PCP in 5 days 2.  Take prednisone daily for 3 additional days 3.  Take azithromycin daily for 3 additional days DIET:  Cardiac diet DISCHARGE CONDITION:  Stable ACTIVITY:  Activity as tolerated OXYGEN:  Home Oxygen: Yes.    Oxygen Delivery: 2 liters/min via Patient connected to nasal cannula oxygen DISCHARGE LOCATION:  home   If you experience worsening of your admission symptoms,  develop shortness of breath, life threatening emergency, suicidal or homicidal thoughts you must seek medical attention immediately by calling 911 or calling your MD immediately  if symptoms less severe.  You Must read complete instructions/literature along with all the possible adverse reactions/side effects for all the Medicines you take and that have been prescribed to you. Take any new Medicines after you have completely understood and accpet all the possible adverse reactions/side effects.   Please note  You were cared for by a hospitalist during your hospital stay. If you have any questions about your discharge medications or the care  you received while you were in the hospital after you are discharged, you can call the unit and asked to speak with the hospitalist on call if the hospitalist that took care of you is not available. Once you are discharged, your primary care physician will handle any further medical issues. Please note that NO REFILLS for any discharge medications will be authorized once you are discharged, as it is imperative that you return to your primary care physician (or establish a relationship with a primary care physician if you do not have one) for your aftercare needs so that they can reassess your need for medications and monitor your lab values.    On the day of Discharge:  VITAL SIGNS:  Blood pressure 94/65, pulse 78, temperature 98.2 F (36.8 C), temperature source Oral, resp. rate 18, height 5\' 3"  (1.6 m), weight 63.5 kg, SpO2 96 %. PHYSICAL EXAMINATION:  GENERAL:  78 y.o.-year-old patient lying in the bed with no acute distress.  EYES: Pupils equal, round, reactive to light and accommodation. No scleral icterus. Extraocular muscles intact.  HEENT: Head atraumatic, normocephalic. Oropharynx and nasopharynx clear.  NECK:  Supple, no jugular venous distention. No thyroid enlargement, no tenderness.  LUNGS: Mildly decreased air movement throughout all lung fields, no wheezing.  No use of accessory muscles of respiration.  Nasal cannula in place CARDIOVASCULAR: RRR, S1, S2 normal. No murmurs, rubs, or gallops.  ABDOMEN: Soft, non-tender, non-distended. Bowel sounds present. No organomegaly or mass.  EXTREMITIES: No pedal edema, cyanosis, or clubbing.  NEUROLOGIC: Cranial nerves II through XII are intact. Muscle strength 5/5 in all extremities. Sensation intact. Gait not checked.  PSYCHIATRIC: The patient is alert and oriented x 3.  SKIN: No obvious rash, lesion, or ulcer.  DATA REVIEW:   CBC Recent Labs  Lab 06/15/18 1722  WBC 7.8  HGB 14.2  HCT 44.9  PLT 175    Chemistries  Recent Labs   Lab 06/15/18 1603  NA 140  K 4.1  CL 105  CO2 28  GLUCOSE 131*  BUN 21  CREATININE 1.24  CALCIUM 9.1  AST 16  ALT 12  ALKPHOS 76  BILITOT 1.4*     Microbiology Results  Results for orders placed or performed during the hospital encounter of 06/15/18  Culture, blood (routine x 2)     Status: None (Preliminary result)   Collection Time: 06/15/18  4:37 PM  Result Value Ref Range Status   Specimen Description BLOOD LEFT ANTECUBITAL  Final   Special Requests   Final    BOTTLES DRAWN AEROBIC AND ANAEROBIC Blood Culture adequate volume   Culture   Final    NO GROWTH 2 DAYS Performed at [facility], [address], [place], [place] [address]    Report Status PENDING  Incomplete  Culture, blood (routine x 2)     Status: None (Preliminary result)   Collection Time: 06/15/18  4:37 PM  Result Value Ref Range Status   Specimen Description BLOOD RIGHT ANTECUBITAL  Final   Special Requests   Final    BOTTLES DRAWN AEROBIC AND ANAEROBIC Blood Culture adequate volume   Culture   Final    NO GROWTH 2 DAYS Performed at [facility], [address] [place], [place] [address]    Report Status PENDING  Incomplete  MRSA PCR Screening     Status: None   Collection Time: 06/15/18  8:20 PM  Result Value Ref Range Status   MRSA by PCR NEGATIVE NEGATIVE Final    Comment:        The GeneXpert MRSA Assay (FDA approved for NASAL specimens only), is one component of a comprehensive MRSA colonization surveillance program. It is not intended to diagnose MRSA infection nor to guide or monitor treatment for MRSA infections. Performed at [facility], [address], [place], [place] [address]     RADIOLOGY:  No results found.   Management plans discussed with the patient, family and they are in agreement.  CODE STATUS: Full Code   TOTAL TIME TAKING CARE OF THIS PATIENT: 35 minutes.    Berna Spare Lillybeth Tal M.D on 06/17/2018 at 9:38 AM  Between 7am to 6pm -  Pager - [phone]  After 6pm go to [email or web address] - Proofreader  [facility]  Office  [phone]  CC: Primary care physician; Lorelee Market, MD   Note: This dictation was prepared with Dragon dictation along with smaller phrase technology. Any transcriptional errors that result from this process are unintentional.

## 2018-06-17 NOTE — Care Management CC44 (Signed)
Condition Code 44 Documentation Completed  Patient Details  Name: James Dickerson MRN: [id number] Date of Birth: November 14, 1940   Condition Code 44 given:  Yes Patient signature on Condition Code 44 notice:  Yes Documentation of 2 MD's agreement:  Yes Code 44 added to claim:  Yes    Shelbie Ammons, RN 06/17/2018, 12:27 PM

## 2018-06-17 NOTE — Care Management Obs Status (Signed)
[facility] NOTIFICATION   Patient Details  Name: James Dickerson MRN: [id number] Date of Birth: [birth year]   Medicare Observation Status Notification Given:  Yes    Shelbie Ammons, RN 06/17/2018, 12:26 PM

## 2018-06-17 NOTE — Progress Notes (Signed)
Dc instructions given.  Extensive teaching about medications to patient and family.  Pt asked family to bring oxygen from home for dc/  Family brought concentrator and stated that pt does not have portable tank.  Care mgt aware.  And they will help get portable tank for dc.  F/U understood by pt and family.  Can dc home after O2 arrives.

## 2018-06-20 LAB — CULTURE, BLOOD (ROUTINE X 2)
CULTURE: NO GROWTH
CULTURE: NO GROWTH
SPECIAL REQUESTS: ADEQUATE
Special Requests: ADEQUATE

## 2018-06-28 LAB — BLOOD GAS, VENOUS
Acid-Base Excess: 2.3 mmol/L — ABNORMAL HIGH (ref 0.0–2.0)
BICARBONATE: 29.5 mmol/L — AB (ref 20.0–28.0)
Delivery systems: POSITIVE
FIO2: 0.8
O2 Saturation: 14.6 %
PATIENT TEMPERATURE: 37
PCO2 VEN: 56 mmHg (ref 44.0–60.0)
pH, Ven: 7.33 (ref 7.250–7.430)

## 2019-02-11 IMAGING — DX DG CHEST 1V PORT
1 series · 1 of 1 positions shown · non-contrast
Comparison: 08/09/2011

CLINICAL DATA: Weakness and bradycardia.  Evaluate for CHF.

EXAM:
PORTABLE CHEST 1 VIEW

[chest ap]
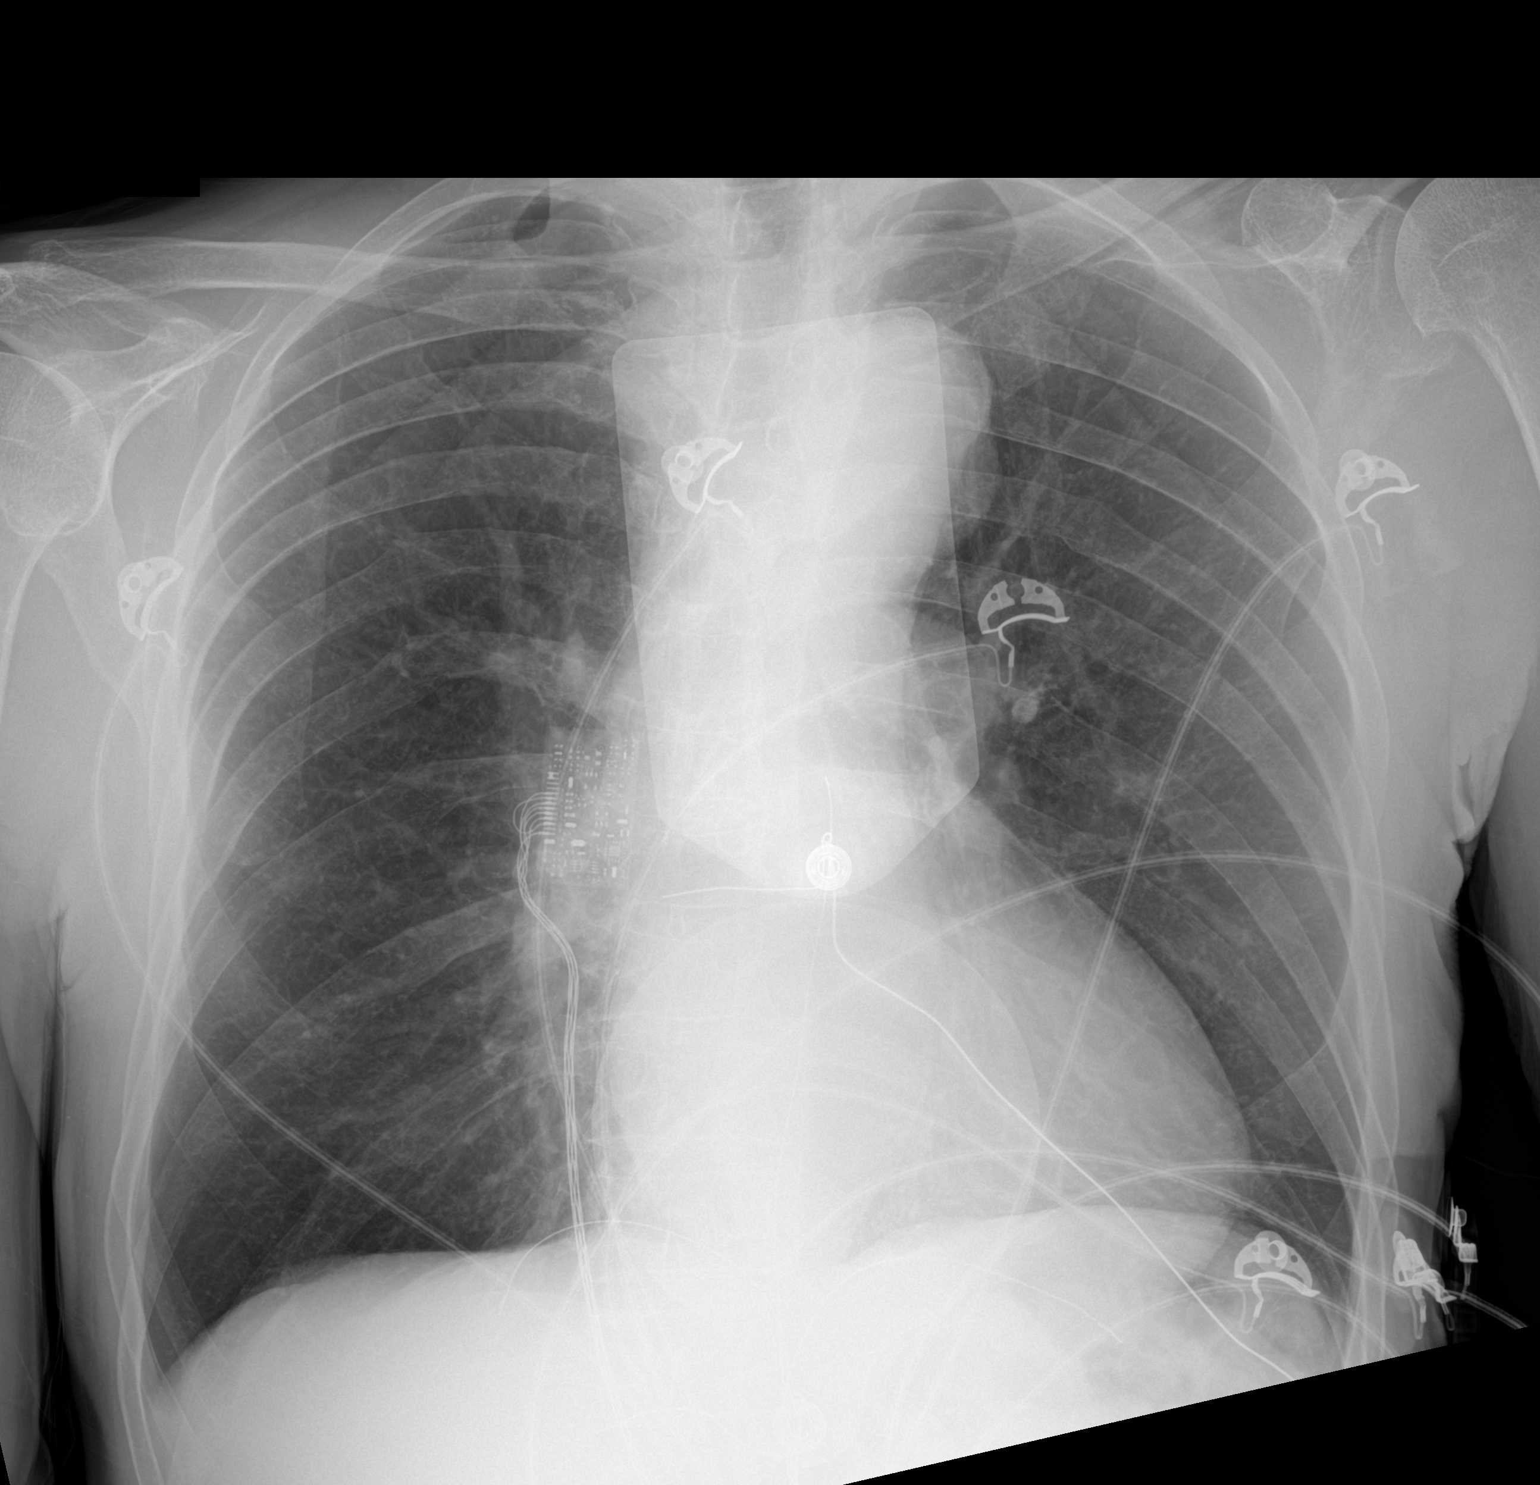

[1 of 1 positions shown; findings below may reference images not displayed]

FINDINGS: The heart size and mediastinal contours are within normal limits.
Both lungs are clear. No pleural effusion or edema. The visualized
skeletal structures are unremarkable.
IMPRESSION: No evidence for CHF.

## 2019-11-16 ENCOUNTER — Telehealth: Payer: Self-pay | Admitting: *Deleted

## 2019-11-16 DIAGNOSIS — Z122 Encounter for screening for malignant neoplasm of respiratory organs: Secondary | ICD-10-CM

## 2019-11-16 DIAGNOSIS — Z87891 Personal history of nicotine dependence: Secondary | ICD-10-CM

## 2019-11-16 NOTE — Telephone Encounter (Signed)
Attempted to contact regarding lung screening referral. Person answering phone will have patient call back tomorrow.

## 2019-11-17 NOTE — Telephone Encounter (Signed)
Received referral for initial lung cancer screening scan. Contacted patient and obtained smoking history,(current, 30.5 pack year) as well as answering questions related to screening process. Patient denies signs of lung cancer such as weight loss or hemoptysis. Patient denies comorbidity that would prevent curative treatment if lung cancer were found. Patient is scheduled for shared decision making visit and CT scan on 12/13/19 at 10am.

## 2019-11-17 NOTE — Addendum Note (Signed)
Addended by: Lieutenant Diego on: 11/17/2019 10:49 AM   Modules accepted: Orders

## 2019-11-23 ENCOUNTER — Other Ambulatory Visit: Payer: Self-pay | Admitting: Unknown Physician Specialty

## 2019-11-23 ENCOUNTER — Other Ambulatory Visit (HOSPITAL_COMMUNITY): Payer: Self-pay | Admitting: Unknown Physician Specialty

## 2019-11-23 DIAGNOSIS — J3800 Paralysis of vocal cords and larynx, unspecified: Secondary | ICD-10-CM

## 2019-12-01 ENCOUNTER — Other Ambulatory Visit: Payer: Self-pay

## 2019-12-01 ENCOUNTER — Ambulatory Visit
Admission: RE | Admit: 2019-12-01 | Discharge: 2019-12-01 | Disposition: A | Discharge status: Home / Self Care | Payer: Medicare Other | Source: Ambulatory Visit | Attending: Unknown Physician Specialty | Admitting: Unknown Physician Specialty

## 2019-12-01 DIAGNOSIS — J3801 Paralysis of vocal cords and larynx, unilateral: Secondary | ICD-10-CM | POA: Diagnosis not present

## 2019-12-01 DIAGNOSIS — J479 Bronchiectasis, uncomplicated: Secondary | ICD-10-CM | POA: Insufficient documentation

## 2019-12-01 DIAGNOSIS — I700 Atherosclerosis of aorta: Secondary | ICD-10-CM | POA: Diagnosis not present

## 2019-12-01 DIAGNOSIS — J439 Emphysema, unspecified: Secondary | ICD-10-CM | POA: Insufficient documentation

## 2019-12-01 DIAGNOSIS — I6782 Cerebral ischemia: Secondary | ICD-10-CM | POA: Diagnosis not present

## 2019-12-01 DIAGNOSIS — J3800 Paralysis of vocal cords and larynx, unspecified: Secondary | ICD-10-CM | POA: Diagnosis present

## 2019-12-01 LAB — POCT I-STAT CREATININE: Creatinine, Ser: 1.1 mg/dL (ref 0.61–1.24)

## 2019-12-01 MED ORDER — IOHEXOL 300 MG/ML  SOLN
75.0000 mL | Freq: Once | INTRAMUSCULAR | Status: AC | PRN
  Administered 2019-12-01: 75 mL via INTRAVENOUS

## 2019-12-13 ENCOUNTER — Encounter: Payer: Self-pay | Admitting: Hospice and Palliative Medicine

## 2019-12-13 ENCOUNTER — Ambulatory Visit
Admission: RE | Admit: 2019-12-13 | Discharge: 2019-12-13 | Disposition: A | Discharge status: Home / Self Care | Payer: Medicare Other | Source: Ambulatory Visit | Attending: Oncology | Admitting: Oncology

## 2019-12-13 ENCOUNTER — Other Ambulatory Visit: Payer: Self-pay | Admitting: *Deleted

## 2019-12-13 ENCOUNTER — Other Ambulatory Visit: Payer: Self-pay

## 2019-12-13 ENCOUNTER — Inpatient Hospital Stay: Payer: Medicare Other | Attending: Hospice and Palliative Medicine | Admitting: Hospice and Palliative Medicine

## 2019-12-13 DIAGNOSIS — Z87891 Personal history of nicotine dependence: Secondary | ICD-10-CM

## 2019-12-13 DIAGNOSIS — Z122 Encounter for screening for malignant neoplasm of respiratory organs: Secondary | ICD-10-CM

## 2019-12-13 NOTE — Progress Notes (Signed)
Virtual Visit via Video Note  I connected with@ on 12/13/19 at@ by a video enabled telemedicine application and verified that I am speaking with the correct person using two identifiers.   I discussed the limitations of evaluation and management by telemedicine and the availability of in person appointments. The patient expressed understanding and agreed to proceed.  In accordance with CMS guidelines, patient has met eligibility criteria including age, absence of signs or symptoms of lung cancer.  Social History   Tobacco Use  . Smoking status: Current Every Day Smoker    Packs/day: 0.50    Years: 67.00    Pack years: 33.50    Types: Cigarettes  . Smokeless tobacco: Never Used  Vaping Use  . Vaping Use: Never used  Substance Use Topics  . Alcohol use: No  . Drug use: No        Counseling on the importance of continued smoking cessation for former smokers; the importance of smoking cessation for current smokers, and information about tobacco cessation interventions have been given to patient including [place] and [facility] quit [place] programs.    In speaking with patient I learned that he had a diagnostic CT on 7/9.  Patient will therefore not require screening CT today.  Patient is also 36 and will age out of the annual screening requirement.  However, patient reports that he has 4 months of hoarseness.  I recommended follow-up with ENT.  Case discussed with Melinda Crutch who confirmed that patient has a pending appointment with ENT.  Patient also would benefit from surveillance and vascular surgery eval for his AAA.    Time Total: 15 minutes  Visit consisted of counseling and education dealing with complex health screening. Greater than 50%  of this time was spent counseling and coordinating care related to the above assessment and plan.  Signed by: Altha Harm, PhD, NP-C

## 2019-12-15 ENCOUNTER — Ambulatory Visit (INDEPENDENT_AMBULATORY_CARE_PROVIDER_SITE_OTHER): Payer: Medicare Other | Admitting: Cardiothoracic Surgery

## 2019-12-15 ENCOUNTER — Encounter: Payer: Self-pay | Admitting: Cardiothoracic Surgery

## 2019-12-15 ENCOUNTER — Other Ambulatory Visit: Payer: Self-pay

## 2019-12-15 VITALS — BP 162/96 | HR 87 | Temp 98.4°F | Ht 68.0 in | Wt 125.8 lb

## 2019-12-15 DIAGNOSIS — I714 Abdominal aortic aneurysm, without rupture, unspecified: Secondary | ICD-10-CM

## 2019-12-15 NOTE — Patient Instructions (Addendum)
We will refer you to Cardiothoracic Surgery in [place] to see about fixing you aneurysm. We will speak with them to see who to send you to for this.   They will contact you to schedule this appointment.

## 2019-12-15 NOTE — Progress Notes (Signed)
Patient ID: James Dickerson, male   DOB: 1941-03-08, 79 y.o.   MRN: [id number]  Chief Complaint  Patient presents with  . New Patient (Initial Visit)    Aortic Aneurysm    Referred By Dr. Tami Ribas Reason for Referral aortic aneurysm  HPI Location, Quality, Duration, Severity, Timing, Context, Modifying Factors, Associated Signs and Symptoms.  James Dickerson is a 79 y.o. male.  He has a longstanding history of emphysematous had multiple CT scans made over the years for that reason.  About 3 months ago he started developing increasing hoarseness and ultimately was referred to Dr. Freda Munro clean for evaluation of his hoarseness.  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  He does have a longstanding smoking history and continues to smoke.  He also has significant cardiac disease and has had a pacemaker placed about 3 years ago.  The CT scan of the chest revealed that 2 cm aortic aneurysm extending underneath the aortic arch along the AP window which was felt to be responsible for his left vocal cord paralysis.  He is sent here at this time for evaluation.  He does have a history of hypertension as well as hypercholesterolemia and cardiomegaly.  His blood pressure today was 162/96.  He is most concerned about his vocal cords.  He states that he would like to have that returned normal.  I did explain to him that the location of the aortic aneurysm might be responsible for his vocal cord paralysis.  I am not sure that his vocal cord paralysis will improve even if the saccular aneurysm is fixed.   Past Medical History:  Diagnosis Date  . Back pain   . Cardiomegaly   . COPD (chronic obstructive pulmonary disease) ([place])   . Cystic kidney disease   . Depressive disorder   . GERD (gastroesophageal reflux disease)   . High cholesterol   . History of prostate cancer   .  Hypertension   . Prostate cancer ([place]) 2018   Rad seeds  . Thyroid nodule     Past Surgical History:  Procedure Laterality Date  . APPENDECTOMY  1971  . PACEMAKER INSERTION N/A 08/06/2016   Procedure: INSERTION PACEMAKER;  Surgeon: Isaias Cowman, MD;  Location: [facility] ORS;  Service: Cardiovascular;  Laterality: N/A;    No family history on file.  Social History Social History   Tobacco Use  . Smoking status: Current Every Day Smoker    Packs/day: 0.25    Years: 67.00    Pack years: 16.75    Types: Cigarettes  . Smokeless tobacco: Never Used  Vaping Use  . Vaping Use: Never used  Substance Use Topics  . Alcohol use: No  . Drug use: No    No Known Allergies  Current Outpatient Medications  Medication Sig Dispense Refill  . albuterol (PROVENTIL HFA;VENTOLIN HFA) 108 (90 Base) MCG/ACT inhaler Inhale 2 puffs into the lungs every 6 (six) hours as needed for wheezing or shortness of breath. 1 Inhaler 1  . amLODipine (NORVASC) 5 MG tablet Take 5 mg by mouth every morning.    Marland Kitchen atorvastatin (LIPITOR) 40 MG tablet Take 1 tablet (40 mg total) by mouth at bedtime. 30 tablet 0  . budesonide-formoterol (SYMBICORT) 160-4.5 MCG/ACT inhaler Inhale 2 puffs into the lungs 2 (two) times daily. 1 Inhaler 2  . Cholecalciferol (VITAMIN D3) 50 MCG (2000 UT) CHEW  Chew 2,000 Units by mouth daily.    Marland Kitchen donepezil (ARICEPT) 5 MG tablet Take 5 mg by mouth at bedtime.    Marland Kitchen ipratropium-albuterol (DUONEB) 0.5-2.5 (3) MG/3ML SOLN Take 3 mLs by nebulization 4 (four) times daily.    . nicotine (NICODERM CQ) 21 mg/24hr patch Place 1 patch (21 mg total) onto the skin daily. 28 patch 0  . OXYGEN Inhale 2-3 L into the lungs as needed.    . pantoprazole (PROTONIX) 40 MG tablet Take 40 mg by mouth 2 (two) times daily.    . tamsulosin (FLOMAX) 0.4 MG CAPS capsule Take 0.4 mg by mouth daily.    . valsartan (DIOVAN) 160 MG tablet Take 160 mg by mouth daily.     No current facility-administered medications for  this visit.      Review of Systems A complete review of systems was asked and was negative except for the following positive findings cataracts, claudication, headaches, shortness of breath, dysuria  Blood pressure (!) 162/96, pulse 87, temperature 98.4 F (36.9 C), height 5\' 8"  (1.727 m), weight 125 lb 12.8 oz (57.1 kg), SpO2 94 %.  Physical Exam CONSTITUTIONAL:  Pleasant, well-developed, well-nourished, and in no acute distress. EYES: Pupils equal and reactive to light, Sclera non-icteric EARS, NOSE, MOUTH AND THROAT:  The oropharynx was clear.  Dentition is good repair.  Upper dentures.  Oral mucosa pink and moist. LYMPH NODES:  Lymph nodes in the neck and axillae were normal RESPIRATORY:  Lungs were clear.  Normal respiratory effort without pathologic use of accessory muscles of respiration CARDIOVASCULAR: Heart was regular without murmurs.  There were no carotid bruits. GI: The abdomen was soft, nontender, and nondistended. There were no palpable masses. There was no hepatosplenomegaly. There were normal bowel sounds in all quadrants. GU:  Rectal deferred.   MUSCULOSKELETAL:  Normal muscle strength and tone.  No clubbing or cyanosis.   SKIN:  There were no pathologic skin lesions.  There were no nodules on palpation. NEUROLOGIC:  Sensation is normal.  Cranial nerves are grossly intact. PSYCH:  Oriented to person, place and time.  Mood and affect are normal.  Data Reviewed CT scan  I have personally reviewed the patient's imaging, laboratory findings and medical records.    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  I discussed his care with Levonne Spiller the director of [facility] in [place] and she will arrange  for the patient to be seen by Dr. Servando Snare.  Once the patient follows up with Dr. Servando Snare we can make further recommendations for following this here at [place], MD 12/15/2019, 10:45 AM

## 2019-12-18 ENCOUNTER — Other Ambulatory Visit: Payer: Self-pay

## 2019-12-18 ENCOUNTER — Other Ambulatory Visit: Payer: Self-pay | Admitting: Cardiothoracic Surgery

## 2019-12-18 ENCOUNTER — Institutional Professional Consult (permissible substitution) (INDEPENDENT_AMBULATORY_CARE_PROVIDER_SITE_OTHER): Payer: Medicare Other | Admitting: Cardiothoracic Surgery

## 2019-12-18 ENCOUNTER — Encounter: Payer: Self-pay | Admitting: Cardiothoracic Surgery

## 2019-12-18 DIAGNOSIS — I712 Thoracic aortic aneurysm, without rupture, unspecified: Secondary | ICD-10-CM | POA: Insufficient documentation

## 2019-12-18 NOTE — Progress Notes (Signed)
James Dickerson       [place] [address]             [phone]                    James Dickerson [facility] Medical Record #[id number] Date of Birth: 11/08/1940  Referring: Nestor Lewandowsky, MD Primary Care: Lorelee Market, MD Primary Cardiologist: No primary care provider on file.  Chief Complaint:    Chief Complaint  Patient presents with  . Thoracic Aortic Aneurysm    new patient consultation, Chest CT 12/01/19    History of Present Illness:    James Dickerson 79 y.o. male is seen in the office  today at the request of Dr. Faith Rogue.  The patient noted dated about 3 months ago with a fairly sudden onset of hoarseness.  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  CT of the chest and neck was done with contrast-which redemonstrated a known saccular aneurysm of the aortic arch which is enlarged some CT scan in 2019-overall there is no mention of this in the report the saccular aneurysm is approximately 21 x 24 mm is now 28 x 26 mm.  There is mild dilatation of the ascending aorta approximately 3.6 to 3.7 cm.   The patient is a long-term smoker he has cut back but continues to smoke.  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  He has no previous history of myocardial infarction.  He had multiple syncopal episodes that EMS picked him up from home after this he had a permanent pacemaker placement through [facility].   Patient denies claudication  Notes that he is very limited by shortness of breath just walking around his house to go to the bathroom he notes shortness of breath.  Current Activity/ Functional Status:  Patient is independent with mobility/ambulation, transfers, ADL's, IADL's.   Zubrod Score: At the time of surgery this patient's most appropriate activity  status/level should be described as: []     0    Normal activity, no symptoms []     1    Restricted in physical strenuous activity but ambulatory, able to do out light work [x]     2    Ambulatory and capable of self care, unable to do work activities, up and about               >50 % of waking hours                              []     3    Only limited self care, in bed greater than 50% of waking hours []     4    Completely disabled, no self care, confined to bed or chair []     5    Moribund   Past Medical History:  Diagnosis Date  . Back pain   . Cardiomegaly   . COPD (chronic obstructive pulmonary disease) ([place])   . Cystic kidney disease   . Depressive disorder   . GERD (gastroesophageal reflux disease)   . High cholesterol   . History of prostate cancer   . Hypertension   . Prostate cancer ([place]) 2018   Rad seeds  . Thyroid nodule  Past Surgical History:  Procedure Laterality Date  . APPENDECTOMY  1971  . PACEMAKER INSERTION N/A 08/06/2016   Procedure: INSERTION PACEMAKER;  Surgeon: Isaias Cowman, MD;  Location: [facility] ORS;  Service: Cardiovascular;  Laterality: N/A;    No family history on file.   Social History   Tobacco Use  Smoking Status Current Every Day Smoker  . Packs/day: 0.25  . Years: 67.00  . Pack years: 16.75  . Types: Cigarettes  Smokeless Tobacco Never Used  Tobacco Comment   3 every other day    Social History   Substance and Sexual Activity  Alcohol Use No     No Known Allergies  Current Outpatient Medications  Medication Sig Dispense Refill  . amLODipine (NORVASC) 5 MG tablet Take 5 mg by mouth every morning.    Marland Kitchen atorvastatin (LIPITOR) 40 MG tablet Take 1 tablet (40 mg total) by mouth at bedtime. 30 tablet 0  . budesonide-formoterol (SYMBICORT) 160-4.5 MCG/ACT inhaler Inhale 2 puffs into the lungs 2 (two) times daily. 1 Inhaler 2  . Cholecalciferol (VITAMIN D3) 50 MCG (2000 UT) CHEW Chew 2,000 Units by mouth daily.    Marland Kitchen donepezil  (ARICEPT) 5 MG tablet Take 5 mg by mouth at bedtime.    . OXYGEN Inhale 2-3 L into the lungs as needed.    . pantoprazole (PROTONIX) 40 MG tablet Take 40 mg by mouth 2 (two) times daily.    . tamsulosin (FLOMAX) 0.4 MG CAPS capsule Take 0.4 mg by mouth daily.    . valsartan (DIOVAN) 160 MG tablet Take 160 mg by mouth daily.     No current facility-administered medications for this visit.    Pertinent items are noted in HPI.   Review of Systems:     Cardiac Review of Systems: [Y] = yes  or   [ N ] = no   Chest Pain [ n   ]  Resting SOB [ y  ] Exertional SOB  [ y ]  Orthopnea Blue.Reese  ]   Pedal Edema [ n  ]    Palpitations [n  ] Syncope  Blue.Reese  ]   Presyncope [ n  ]   General Review of Systems: [Y] = yes [  ]=no Constitional: recent weight change [  ];  Wt loss over the last 3 months [   ] anorexia [ y ]; fatigue Blue.Reese  ]; nausea [  ]; night sweats [  ]; fever [  ]; or chills [  ];           Eye : blurred vision [  ]; diplopia [   ]; vision changes [  ];  Amaurosis fugax[  ]; Resp: cough Blue.Reese  ];  wheezing[y  ];  hemoptysis[  ]; shortness of breath[ y ]; paroxysmal nocturnal dyspnea[  ]; dyspnea on exertion[ y ]; or orthopnea[y  ];  GI:  gallstones[  ], vomiting[  ];  dysphagia[  ]; melena[  ];  hematochezia [  ]; heartburn[  ];   Hx of  Colonoscopy[  ]; GU: kidney stones [  ]; hematuria[  ];   dysuria [  ];  nocturia[  ];  history of     obstruction [  ]; urinary frequency [  ]             Skin: rash, swelling[  ];, hair loss[  ];  peripheral edema[  ];  or itching[  ]; Musculosketetal: myalgias[  ];  joint swelling[  ];  joint erythema[  ];  joint pain[  ];  back pain[  ];  Heme/Lymph: bruising[  ];  bleeding[  ];  anemia[  ];  Neuro: TIA[  ];  headaches[  ];  stroke[  ];  vertigo[  ];  seizures[  ];   paresthesias[  ];  difficulty walking[ y ];  Psych:depression[  ]; anxiety[  ];  Endocrine: diabetes[  ];  thyroid dysfunction[  ];  Immunizations: Flu up to date [ n ]; Pneumococcal up to date [  n];   Other:     PHYSICAL EXAMINATION: BP 112/76 (BP Location: Left Arm, Patient Position: Sitting, Cuff Size: Normal)   Pulse 98   Resp 20   Ht 5\' 8"  (1.727 m)   Wt 126 lb 9.6 oz (57.4 kg)   SpO2 93% Comment: RA  BMI 19.25 kg/m  General appearance: alert, cooperative, appears older than stated age and no distress Neck: no adenopathy, no carotid bruit, no JVD, supple, symmetrical, trachea midline and thyroid not enlarged, symmetric, no tenderness/mass/nodules Lymph nodes: Cervical, supraclavicular, and axillary nodes normal. Resp: diminished breath sounds bibasilar Back: symmetric, no curvature. ROM normal. No CVA tenderness. Cardio: regular rate and rhythm, S1, S2 normal, no murmur, click, rub or gallop GI: soft, non-tender; bowel sounds normal; no masses,  no organomegaly Extremities: Patient has noticed lower extremity ulcerations Neurologic: Grossly normal Vascular exam-has no carotid bruits, pulses in the upper extremity are equal bilaterally There is no right femoral-popliteal or pedal pulses, he has a strong left femoral pulse 1+ DP and PT pulses on the left  Diagnostic Studies & Laboratory data:     Recent Radiology Findings:   CT HEAD W & WO CONTRAST  Result Date: 12/02/2019 CLINICAL DATA:  Intermittent change in balance. EXAM: CT HEAD WITHOUT AND WITH CONTRAST TECHNIQUE: Contiguous axial images were obtained from the base of the skull through the vertex without and with intravenous contrast CONTRAST:  70mL OMNIPAQUE IOHEXOL 300 MG/ML  SOLN COMPARISON:  02/12/2010 FINDINGS: Brain: Confluent gliosis in the cerebral white matter. 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 Presumably remote perforator/lacunar infarcts the bilateral basal ganglia. Brain volume is normal. No evidence of acute gray matter infarct, hemorrhage, hydrocephalus, or mass. Vascular: Major vessels are enhancing, including vertebrobasilar Skull: Normal. Negative for fracture or focal lesion. Sinuses/Orbits:  Negative IMPRESSION: 1. No acute or reversible finding. 2. Extensive chronic small vessel ischemia. Electronically Signed   By: Monte Fantasia M.D.   On: 12/02/2019 09:31   CT SOFT TISSUE NECK W CONTRAST  Result Date: 12/02/2019 CLINICAL DATA:  Intermittent change in balance. Hoarseness and shortness of breath on exertion EXAM: CT NECK WITH CONTRAST TECHNIQUE: Multidetector CT imaging of the neck was performed using the standard protocol following the bolus administration of intravenous contrast. CONTRAST:  66mL OMNIPAQUE IOHEXOL 300 MG/ML  SOLN COMPARISON:  None similar FINDINGS: Pharynx and larynx: Findings of left vocal cord paresis with medial positioning and laryngeal ventricle/piriform sinus enlargement. Presumed cause is a saccular partially thrombosed aneurysm projecting inferiorly from the aortic isthmus and measuring 2.8 x 2.6 cm, size increased from a 2019 chest CTA Salivary glands: Microlithiasis in the left parotid. Thyroid: Subcentimeter left thyroid nodules. No followup recommended (ref: J Am Coll Radiol. 2015 Feb;12(2): 143-50). Lymph nodes: None enlarged or abnormal density. Vascular: Aortic aneurysm as described.  Atherosclerosis. Limited intracranial: Negative Visualized orbits: Negative Mastoids and visualized paranasal sinuses: Negative Skeleton: Diffuse degenerative disc collapse with endplate spurring. Upper chest: Emphysema. IMPRESSION: Findings of left vocal  cord paresis most likely related to a 2.8 x 2.6 cm saccular aneurysm projecting inferiorly from the aortic isthmus. The aneurysm has enlarged from a 2019 chest CTA; recommend CTS follow-up. Electronically Signed   By: Monte Fantasia M.D.   On: 12/02/2019 09:29   CT CHEST W CONTRAST  Result Date: 12/01/2019 CLINICAL DATA:  Intermittent balance changes, or since and shortness of breath with exertion for 3 months EXAM: CT CHEST WITH CONTRAST TECHNIQUE: Multidetector CT imaging of the chest was performed during intravenous contrast  administration. CONTRAST:  42mL OMNIPAQUE IOHEXOL 300 MG/ML  SOLN COMPARISON:  Radiograph 06/15/2018, CT chest 07/10/2017 FINDINGS: Cardiovascular: The aortic root is suboptimally assessed given cardiac pulsation artifact. Normal caliber ascending thoracic aorta. Redemonstration of a focal saccular aneurysmal dilatation extending inferomedially from the arch just beyond the origin of the left subclavian artery. This saccular outpouching has a base of approximately 2.9 cm in maximal depth of 2.5 cm containing a mixture of calcified noncalcified atheromatous plaque. This is certainly increased in size from the comparison exam where the same measurements were approximately 2.5 cm and 1.4 cm respectively. More normal caliber descending aortic arch. Normal branching of the proximal great vessels. Minimal plaque in the great vessels. Central pulmonary arteries are normal caliber. No central, lobar or segmental filling defects are identified. Cardiac size at the upper limits of normal. Cardiac pacer leads at the right atrium and cardiac apex. No pericardial effusion. Few coronary artery calcifications. Mediastinum/Nodes: No mediastinal fluid or gas. Normal thyroid gland and thoracic inlet. No acute abnormality of the trachea or esophagus. No worrisome mediastinal, hilar or axillary adenopathy. Lungs/Pleura: Centrilobular predominant emphysematous changes. Diffuse mild airways thickening. Scarring and architectural distortion noted in the medial bases with some associated cicatricial bronchiectatic changes, likely reflecting sequela of prior infection seen on comparison imaging. No acute consolidative opacities. No features of edema. No pneumothorax or visible effusion. No concerning pulmonary nodules or masses. Upper Abdomen: No acute abnormalities present in the visualized portions of the upper abdomen. Stable 3.9 cm fluid attenuation cyst arising from the upper pole left kidney. Musculoskeletal: The osseous structures  appear diffusely demineralized which may limit detection of small or nondisplaced fractures. No acute osseous abnormality or suspicious osseous lesion. Multilevel degenerative changes are present in the imaged portions of the spine. Additional degenerative changes in the shoulders. Pacer battery pack overlies the left chest wall with intact pacer leads. IMPRESSION: 1. Interval increase in size of the more saccular aneurysmal dilatation extending inferomedially from the distal aortic arch just beyond the origin of the left subclavian artery, now measuring up to 2.9 cm in maximal depth of 2.5 cm containing a mixture of calcified noncalcified atheromatous plaque. Overall diameter of the arch caliber including the saccular aneurysm measures up to 4.7 cm in size. Recommend semi-annual imaging followup by CTA or MRA and referral to cardiothoracic surgery if not already obtained. This recommendation follows 2010 ACCF/AHA/AATS/ACR/ASA/SCA/SCAI/SIR/STS/SVM Guidelines for the Diagnosis and Management of Patients With Thoracic Aortic Disease. Circulation. 2010; 121: I951-O84. Aortic aneurysm NOS (ICD10-I71.9) 2. Scarring and architectural distortion in the medial bases with some associated cicatricial bronchiectatic changes, likely reflecting sequela of prior infection seen on comparison imaging. 3. Emphysema (ICD10-J43.9) and chronic bronchitic features. 4. Aortic Atherosclerosis (ICD10-I70.0) Electronically Signed   By: Lovena Le M.D.   On: 12/01/2019 19:46     I have independently reviewed the above radiology studies  and reviewed the findings with the patient.   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    I discussed with the patient and his sister and reviewed the CT scan with him finding a saccular aneurysm of the distal arch-the patient would not be a candidate for open operative procedure for this especially considering his age and multiple medical problems.  Could consider stent placement-likely this may require extra anatomic bypass as to obtain adequate seal would have to at least cover the left subclavian-before considering this will fully evaluate the patient with CTA of the abdomen and pelvis chest neck and brain-to evaluate vascular access sizing and determine the intracranial circulation to determine if preoperative extra-anatomic bypass may be necessary   We will plan to see him back after the above scans are done and also have him seen by Dr. Trula Slade vascular surgeon     Grace Isaac MD      [address] [place] [address] Office [phone]     12/18/2019 3:03 PM

## 2019-12-20 ENCOUNTER — Encounter: Payer: Medicare Other | Admitting: Cardiothoracic Surgery

## 2019-12-21 LAB — PULMONARY FUNCTION TEST

## 2019-12-26 LAB — PULMONARY FUNCTION TEST

## 2020-01-01 ENCOUNTER — Other Ambulatory Visit: Payer: Self-pay | Admitting: *Deleted

## 2020-01-01 ENCOUNTER — Ambulatory Visit
Admission: RE | Admit: 2020-01-01 | Discharge: 2020-01-01 | Disposition: A | Discharge status: Home / Self Care | Payer: Medicare Other | Source: Ambulatory Visit | Attending: Cardiothoracic Surgery | Admitting: Cardiothoracic Surgery

## 2020-01-01 ENCOUNTER — Other Ambulatory Visit: Payer: Self-pay | Admitting: Cardiothoracic Surgery

## 2020-01-01 DIAGNOSIS — Q253 Supravalvular aortic stenosis: Secondary | ICD-10-CM

## 2020-01-01 DIAGNOSIS — I712 Thoracic aortic aneurysm, without rupture, unspecified: Secondary | ICD-10-CM

## 2020-01-01 MED ORDER — IOPAMIDOL (ISOVUE-370) INJECTION 76%
50.0000 mL | Freq: Once | INTRAVENOUS | Status: AC | PRN
  Administered 2020-01-01: 50 mL via INTRAVENOUS

## 2020-01-22 ENCOUNTER — Ambulatory Visit (INDEPENDENT_AMBULATORY_CARE_PROVIDER_SITE_OTHER): Payer: Medicare Other | Admitting: Surgery

## 2020-01-22 ENCOUNTER — Encounter: Payer: Self-pay | Admitting: Surgery

## 2020-01-22 ENCOUNTER — Other Ambulatory Visit: Payer: Self-pay

## 2020-01-22 VITALS — BP 160/95 | HR 70 | Temp 98.0°F | Resp 20 | Ht 68.0 in | Wt 121.8 lb

## 2020-01-22 DIAGNOSIS — I712 Thoracic aortic aneurysm, without rupture, unspecified: Secondary | ICD-10-CM

## 2020-01-22 DIAGNOSIS — I714 Abdominal aortic aneurysm, without rupture, unspecified: Secondary | ICD-10-CM

## 2020-01-22 NOTE — Progress Notes (Signed)
[facility]ist [facility]  Patient name: James Dickerson MRN: [id number] DOB: 31-Mar-1941 Sex: male   REQUESTING PROVIDER:    Dr. Servando Snare   REASON FOR CONSULT:    Thoracic aortic aneurysm  HISTORY OF PRESENT ILLNESS:   James Dickerson is a 79 y.o. male, who is referred for evaluation of the thoracic and abdominal aortic aneurysm.  The thoracic component measures 5.8 cm.  The abdominal component is 5.4 cm.  This was detected during a work-up for sudden onset of hoarseness.  The patient suffers from COPD secondary to lifelong smoking.  He is on 2 L home oxygen.  His walking distance is very limited secondary to his shortness of breath.  He takes a statin for hypercholesterolemia.  He is medically managed for hypertension.  Patient denies any history of cardiac disease.  He has a pacemaker in place secondary to syncopal episodes.  He does endorse right leg cramping with activity  PAST MEDICAL HISTORY    Past Medical History:  Diagnosis Date  . Back pain   . Cardiomegaly   . COPD (chronic obstructive pulmonary disease) ([place])   . Cystic kidney disease   . Depressive disorder   . GERD (gastroesophageal reflux disease)   . High cholesterol   . History of prostate cancer   . Hypertension   . Prostate cancer ([place]) 2018   Rad seeds  . Thyroid nodule      FAMILY HISTORY   No family history on file.  SOCIAL HISTORY:   Social History   Socioeconomic History  . Marital status: Single    Spouse name: Not on file  . Number of children: Not on file  . Years of education: Not on file  . Highest education level: Not on file  Occupational History  . Not on file  Tobacco Use  . Smoking status: Current Every Day Smoker    Packs/day: 0.25    Years: 67.00    Pack years: 16.75    Types: Cigarettes  . Smokeless tobacco: Never Used  . Tobacco comment: 3 every other day  Vaping Use  . Vaping Use: Never used  Substance and Sexual  Activity  . Alcohol use: No  . Drug use: No  . Sexual activity: Not on file  Other Topics Concern  . Not on file  Social History Narrative  . Not on file   Social Determinants of Health   Financial Resource Strain:   . Difficulty of Paying Living Expenses: Not on file  Food Insecurity:   . Worried About Charity fundraiser in the Last Year: Not on file  . Ran Out of Food in the Last Year: Not on file  Transportation Needs:   . Lack of Transportation (Medical): Not on file  . Lack of Transportation (Non-Medical): Not on file  Physical Activity:   . Days of Exercise per Week: Not on file  . Minutes of Exercise per Session: Not on file  Stress:   . Feeling of Stress : Not on file  Social Connections:   . Frequency of Communication with Friends and Family: Not on file  . Frequency of Social Gatherings with Friends and Family: Not on file  . Attends Religious Services: Not on file  . Active Member of Clubs or Organizations: Not on file  . Attends Archivist Meetings: Not on file  . Marital Status: Not on file  Intimate Partner Violence:   . Fear of Current or Ex-Partner: Not on file  .  Emotionally Abused: Not on file  . Physically Abused: Not on file  . Sexually Abused: Not on file    ALLERGIES:    No Known Allergies  CURRENT MEDICATIONS:    Current Outpatient Medications  Medication Sig Dispense Refill  . amLODipine (NORVASC) 5 MG tablet Take 5 mg by mouth every morning.    Marland Kitchen atorvastatin (LIPITOR) 40 MG tablet Take 1 tablet (40 mg total) by mouth at bedtime. 30 tablet 0  . budesonide-formoterol (SYMBICORT) 160-4.5 MCG/ACT inhaler Inhale 2 puffs into the lungs 2 (two) times daily. 1 Inhaler 2  . Cholecalciferol (VITAMIN D3) 50 MCG (2000 UT) CHEW Chew 2,000 Units by mouth daily.    Marland Kitchen donepezil (ARICEPT) 5 MG tablet Take 5 mg by mouth at bedtime.    . OXYGEN Inhale 2-3 L into the lungs as needed.    . pantoprazole (PROTONIX) 40 MG tablet Take 40 mg by mouth 2  (two) times daily.    . tamsulosin (FLOMAX) 0.4 MG CAPS capsule Take 0.4 mg by mouth daily.    . valsartan (DIOVAN) 160 MG tablet Take 160 mg by mouth daily.     No current facility-administered medications for this visit.    REVIEW OF SYSTEMS:   [X]  denotes positive finding, [ ]  denotes negative finding Cardiac  Comments:  Chest pain or chest pressure:    Shortness of breath upon exertion:    Short of breath when lying flat:    Irregular heart rhythm:        Vascular    Pain in calf, thigh, or hip brought on by ambulation: x   Pain in feet at night that wakes you up from your sleep:     Blood clot in your veins:    Leg swelling:         Pulmonary    Oxygen at home:    Productive cough:     Wheezing:         Neurologic    Sudden weakness in arms or legs:     Sudden numbness in arms or legs:     Sudden onset of difficulty speaking or slurred speech:    Temporary loss of vision in one eye:     Problems with dizziness:         Gastrointestinal    Blood in stool:      Vomited blood:         Genitourinary    Burning when urinating:     Blood in urine:        Psychiatric    Major depression:         Hematologic    Bleeding problems:    Problems with blood clotting too easily:        Skin    Rashes or ulcers:        Constitutional    Fever or chills:     PHYSICAL EXAM:   There were no vitals filed for this visit.  GENERAL: The patient is a well-nourished male, in no acute distress. The vital signs are documented above. CARDIAC: There is a regular rate and rhythm.  VASCULAR: Palpable left dorsalis pedis pulse, nonpalpable right PULMONARY: Nonlabored respirations ABDOMEN: Soft and non-tender   MUSCULOSKELETAL: There are no major deformities or cyanosis. NEUROLOGIC: No focal weakness or paresthesias are detected.  Hoarse voice SKIN: There are no ulcers or rashes noted. PSYCHIATRIC: The patient has a normal affect.  STUDIES:   I have reviewed his CTA with  the  following findings:  1. There is no evidence for a thoracic aortic dissection. 2. Again noted is a saccular aneurysm arising from the distal aortic arch/isthmus. This is stable from the patient's recent prior study in July and currently measures approximately 5.9 cm. Cardiothoracic surgery consultation recommended due to increased risk of rupture for arch aneurysm ? 5.5 cm. This recommendation follows 2010 ACCF/AHA/AATS/ACR/ASA/SCA/SCAI/SIR/STS/SVM Guidelines for the Diagnosis and Management of Patients With Thoracic Aortic Disease. Circulation. 2010; 121: L456-Y563. Aortic aneurysm NOS (ICD10-I71.9) 3. Infrarenal abdominal aortic aneurysm measuring up to 5.4 cm. Vascular surgery consultation is recommended. 4. 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 This artery was patent in 2013. 5. There is a cystic appearing lesion in the pancreatic body measuring up to 1.5 cm. This lesion appears to be new since 2019. A follow-up contrast enhanced MRI is recommended in 2 years for further evaluation of this finding. 6. There is an apparent filling defect involving the posterior dependent portion of the urinary bladder. While this could represent a blood clot, a mass is not excluded. Correlation with cystoscopy and urinalysis is recommended.  ASSESSMENT and PLAN   Thoracic aneurysm: I discussed with the patient and his family that based on the location of this aneurysm, I do not think that this can be excluded without sternotomy and aortic arch deep branching.  I will discuss with Dr. Servando Snare whether or not the patient is a candidate for this and if we should proceed with coronary angiography to define his coronary anatomy prior to surgery.  Discussed with the patient that he would need a bypass to his innominate, left carotid, and left subclavian artery followed by thoracic stent graft placement.  AAA: Maximum aortic diameter in the abdominal component is 5.4 cm.  I do  think he would be a endovascular candidate, however the thoracic component needs to be addressed first.  This needs to be done in a staged fashion so as to minimize risk for paralysis.   Leia Alf, MD, FACS [facility] [phone] Pager [phone]

## 2020-01-24 NOTE — Progress Notes (Signed)
[address]       ,[place] [address]             [phone]                    [facility] Medical Record #[id number] Date of Birth: December 15, 1940  Referring: Nestor Lewandowsky, MD Primary Care: Lorelee Market, MD Primary Cardiologist: No primary care provider on file.  Chief Complaint:    Chief Complaint  Patient presents with  . Thoracic Aortic Aneurysm    1 month f/u review scans, C/A/P CT 01/01/20, CTA Neck/Head 01/01/20, PFT's 12/26/19    History of Present Illness:    James Dickerson 79 y.o. male is seen in the office  today at the request of Dr. Faith Rogue.  The patient noted dated about 3 months ago with a fairly sudden onset of hoarseness.  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  CT of the chest and neck was done with contrast-which redemonstrated a known saccular aneurysm of the aortic arch which is enlarged some CT scan in 2019-overall there is no mention of this in the report the saccular aneurysm  Is on 2019 report it was  approximately 21 x 24 mm is now 28 x 26 mm.  There is mild dilatation of the ascending aorta approximately 3.6 to 3.7 cm.   The patient is a long-term smoker he has cut back but continues to smoke.  He has known severe COPD COPDhistory of of hypertension prostate cancer depressive disorder on chronic home oxygen evidence of cardiomegaly.  A family member with him today notes that he is doing relatively well, but most days is very short of breath just moving around the house or going to the bathroom  He has no previous history of myocardial infarction.  He had multiple syncopal episodes that EMS picked him up from home after this he had a permanent pacemaker placement through [facility].   Patient denies claudication    Current Activity/ Functional Status:  Patient is independent with mobility/ambulation, transfers, ADL's, IADL's.   Zubrod  Score: At the time of surgery this patient's most appropriate activity status/level should be described as: []     0    Normal activity, no symptoms []     1    Restricted in physical strenuous activity but ambulatory, able to do out light work [x]     2    Ambulatory and capable of self care, unable to do work activities, up and about               >50 % of waking hours                              []     3    Only limited self care, in bed greater than 50% of waking hours []     4    Completely disabled, no self care, confined to bed or chair []     5    Moribund   Past Medical History:  Diagnosis Date  . Back pain   . Cardiomegaly   . COPD (chronic obstructive pulmonary disease) ([place])   . Cystic kidney disease   . Depressive disorder   . GERD (gastroesophageal reflux disease)   . High cholesterol   . History of prostate cancer   . Hypertension   .  Prostate cancer ([place]) 2018   Rad seeds  . Thyroid nodule     Past Surgical History:  Procedure Laterality Date  . APPENDECTOMY  1971  . PACEMAKER INSERTION N/A 08/06/2016   Procedure: INSERTION PACEMAKER;  Surgeon: Isaias Cowman, MD;  Location: [facility] ORS;  Service: Cardiovascular;  Laterality: N/A;    No family history on file.   Social History   Tobacco Use  Smoking Status Current Every Day Smoker  . Packs/day: 0.25  . Years: 67.00  . Pack years: 16.75  . Types: Cigarettes  Smokeless Tobacco Never Used  Tobacco Comment   3 every other day    Social History   Substance and Sexual Activity  Alcohol Use No     No Known Allergies  Current Outpatient Medications  Medication Sig Dispense Refill  . amLODipine (NORVASC) 5 MG tablet Take 5 mg by mouth every morning.    Marland Kitchen atorvastatin (LIPITOR) 40 MG tablet Take 1 tablet (40 mg total) by mouth at bedtime. 30 tablet 0  . budesonide-formoterol (SYMBICORT) 160-4.5 MCG/ACT inhaler Inhale 2 puffs into the lungs 2 (two) times daily. 1 Inhaler 2  . Cholecalciferol (VITAMIN D3)  50 MCG (2000 UT) CHEW Chew 2,000 Units by mouth daily.    Marland Kitchen donepezil (ARICEPT) 5 MG tablet Take 5 mg by mouth at bedtime.    Marland Kitchen ipratropium-albuterol (DUONEB) 0.5-2.5 (3) MG/3ML SOLN SMARTSIG:3 Milliliter(s) Via Nebulizer Every 6 Hours    . OXYGEN Inhale 2-3 L into the lungs as needed.    . pantoprazole (PROTONIX) 40 MG tablet Take 40 mg by mouth 2 (two) times daily.    . tamsulosin (FLOMAX) 0.4 MG CAPS capsule Take 0.4 mg by mouth daily.    . valsartan (DIOVAN) 160 MG tablet Take 160 mg by mouth daily.     No current facility-administered medications for this visit.    Pertinent items are noted in HPI.   Review of Systems:     Cardiac Review of Systems: [Y] = yes  or   [ N ] = no   Chest Pain [ n   ]  Resting SOB [ y  ] Exertional SOB  [ y ]  Orthopnea Blue.Reese  ]   Pedal Edema [ n  ]    Palpitations [n  ] Syncope  Blue.Reese  ]   Presyncope [ n  ]   General Review of Systems: [Y] = yes [  ]=no Constitional: recent weight change [  ];  Wt loss over the last 3 months [   ] anorexia [ y ]; fatigue Blue.Reese  ]; nausea [  ]; night sweats [  ]; fever [  ]; or chills [  ];           Eye : blurred vision [  ]; diplopia [   ]; vision changes [  ];  Amaurosis fugax[  ]; Resp: cough Blue.Reese  ];  wheezing[y  ];  hemoptysis[  ]; shortness of breath[ y ]; paroxysmal nocturnal dyspnea[  ]; dyspnea on exertion[ y ]; or orthopnea[y  ];  GI:  gallstones[  ], vomiting[  ];  dysphagia[  ]; melena[  ];  hematochezia [  ]; heartburn[  ];   Hx of  Colonoscopy[  ]; GU: kidney stones [  ]; hematuria[  ];   dysuria [  ];  nocturia[  ];  history of     obstruction [  ]; urinary frequency [  ]  Skin: rash, swelling[  ];, hair loss[  ];  peripheral edema[  ];  or itching[  ]; Musculosketetal: myalgias[  ];  joint swelling[  ];  joint erythema[  ];  joint pain[  ];  back pain[  ];  Heme/Lymph: bruising[  ];  bleeding[  ];  anemia[  ];  Neuro: TIA[  ];  headaches[  ];  stroke[  ];  vertigo[  ];  seizures[  ];   paresthesias[  ];   difficulty walking[ y ];  Psych:depression[  ]; anxiety[  ];  Endocrine: diabetes[  ];  thyroid dysfunction[  ];  Immunizations: Flu up to date [ n ]; Pneumococcal up to date [  n];  Other:     PHYSICAL EXAMINATION: BP (!) 155/90   Pulse 88   Temp 97.7 F (36.5 C) (Skin)   Resp 20   Ht 5\' 8"  (1.727 m)   Wt 120 lb (54.4 kg)   SpO2 93% Comment: RA  BMI 18.25 kg/m  General appearance: alert, cooperative, appears older than stated age and no distress Neck: no adenopathy, no carotid bruit, no JVD, supple, symmetrical, trachea midline and thyroid not enlarged, symmetric, no tenderness/mass/nodules Lymph nodes: Cervical, supraclavicular, and axillary nodes normal. Resp: diminished breath sounds bibasilar Back: symmetric, no curvature. ROM normal. No CVA tenderness. Cardio: regular rate and rhythm, S1, S2 normal, no murmur, click, rub or gallop GI: soft, non-tender; bowel sounds normal; no masses,  no organomegaly Extremities: Patient has noticed lower extremity ulcerations Neurologic: Grossly normal Vascular exam-has no carotid bruits, pulses in the upper extremity are equal bilaterally There is no right femoral-popliteal or pedal pulses, he has a strong left femoral pulse 1+ DP and PT pulses on the left  Diagnostic Studies & Laboratory data:     Recent Radiology Findings:    CT ANGIO NECK W OR WO CONTRAST/CT ANGIO CHEST AORTA W/CM & OR WO/CM  Result Date: 01/01/2020 CLINICAL DATA:  Thoracic aortic aneurysm. Assess for carotid stenosis. EXAM: CT ANGIOGRAPHY HEAD AND NECK TECHNIQUE: Multidetector CT imaging of the head and neck was performed using the standard protocol during bolus administration of intravenous contrast. Multiplanar CT image reconstructions and MIPs were obtained to evaluate the vascular anatomy. Carotid stenosis measurements (when applicable) are obtained utilizing NASCET criteria, using the distal internal carotid diameter as the denominator. CONTRAST:  44mL ISOVUE-370  IOPAMIDOL (ISOVUE-370) INJECTION 76% COMPARISON:  CTA chest 12/01/2019. CTA chest same day. FINDINGS: CT HEAD FINDINGS Brain: Age related volume loss. Chronic small-vessel ischemic changes throughout the cerebral hemispheric white matter. Old small vessel infarction right thalamus. Old left posterior frontal cortical and subcortical infarction. No mass lesion, hemorrhage, hydrocephalus or extra-axial collection. Vascular: There is atherosclerotic calcification of the major vessels at the base of the brain. Skull: Negative Sinuses: Clear Orbits: Normal Review of the MIP images confirms the above findings CTA NECK FINDINGS Aortic arch: Aortic atherosclerosis. The entire arch is not included. Origins of the innominate artery and left carotid artery are not included. Left subclavian artery origin is widely patent. On the chest studies , both the innominate artery origin and the left common carotid artery origin widely patent. Right carotid system: Common carotid artery widely patent to the bifurcation. Minimal plaque at the proximal ICA bulb but no stenosis. Cervical ICA widely patent. Left carotid system: Common carotid artery widely patent to the bifurcation. Calcified plaque along the posterior wall of the ICA bulb but no stenosis. Cervical ICA widely patent. Vertebral arteries: Both vertebral artery origins are  patent. No stenosis on the right. Some calcified plaque at the left vertebral artery origin but without stenosis greater than 30%. Both vessels are normal beyond that. Skeleton: Ordinary cervical spondylosis. Other neck: No soft tissue mass or lymphadenopathy. Question vocal fold paresis on the left. Upper chest: Emphysema.  Calcified granuloma at the right apex. Review of the MIP images confirms the above findings CTA HEAD FINDINGS Anterior circulation: Both internal carotid arteries are patent through the skull base and siphon regions. There is siphon atherosclerotic calcification but no stenosis. The  anterior and middle cerebral vessels are patent without proximal stenosis, aneurysm or vascular malformation. Fetal origin of both posterior cerebral arteries. Posterior circulation: Both vertebral arteries are patent through the foramen magnum to the basilar. No basilar stenosis. The basilar artery is somewhat small because of fetal origin of the posterior cerebral arteries. Posterior circulation branch vessels appear unremarkable. Venous sinuses: Patent and normal. Anatomic variants: None significant. Review of the MIP images confirms the above findings IMPRESSION: Brachiocephalic vessel origins excluded from the neck scan. Compared to a chest study, there is no origin stenosis. Atherosclerotic plaque at both ICA bulbs but no stenosis or visible ulceration. Mild atherosclerotic calcification in the carotid siphon regions but no stenosis. 30% or less narrowing at the left vertebral artery origin. No intracranial large or medium vessel occlusion or correctable proximal stenosis. Aortic Atherosclerosis (ICD10-I70.0) and Emphysema (ICD10-J43.9). Electronically Signed   By: Nelson Chimes M.D.   On: 01/01/2020 15:13     Result Date: 01/01/2020 CLINICAL DATA:  Thoracic aortic aneurysm. Abdominal pain. Aortic dissection suspected. EXAM: CT ANGIOGRAPHY CHEST, ABDOMEN AND PELVIS TECHNIQUE: Multidetector CT imaging through the chest, abdomen and pelvis was performed using the standard protocol during bolus administration of intravenous contrast. Multiplanar reconstructed images and MIPs were obtained and reviewed to evaluate the vascular anatomy. CONTRAST:  61mL ISOVUE-370 IOPAMIDOL (ISOVUE-370) INJECTION 76%, 30mL ISOVUE-370 IOPAMIDOL (ISOVUE-370) INJECTION 76%; 30mL ISOVUE-370 IOPAMIDOL (ISOVUE-370) INJECTION 76% COMPARISON:  CT dated December 01, 2019. FINDINGS: CTA CHEST FINDINGS Cardiovascular: There is no evidence for a thoracic aortic dissection. The ascending aorta measures approximately 3.4 cm in diameter. Again noted  is a saccular aneurysm arising from the distal aortic arch/isthmus. There is a moderate amount of surrounding mural thrombus. On the sagittal series the total aneurysmal dilatation measures approximately 5.9 cm (sagittal series 13, image 99). On the patient's study from December 01, 2019 this aneurysmal dilatation measured approximately 5.8 cm. There are atherosclerotic changes of the thoracic aorta. The arch vessels are patent. There are coronary artery calcifications that are mild. The heart size is normal. There is no significant pericardial effusion. There is a dual chamber pacemaker in place. There is no large centrally located pulmonary embolism. Mediastinum/Nodes: -- No mediastinal lymphadenopathy. -- No hilar lymphadenopathy. -- No axillary lymphadenopathy. -- No supraclavicular lymphadenopathy. -- Normal thyroid gland where visualized. -  Unremarkable esophagus. Lungs/Pleura: Emphysematous changes are noted bilaterally. There is atelectasis at the lung bases, right worse than left. The trachea is unremarkable. There is no pneumothorax or significant pleural effusion. Musculoskeletal: There are multiple old healed rib fractures. There is no acute displaced fracture identified on today's study. Review of the MIP images confirms the above findings. CTA ABDOMEN AND PELVIS FINDINGS VASCULAR Aorta: There are atherosclerotic changes throughout the abdominal aorta with an infrarenal abdominal aortic aneurysm measuring approximately 5.4 x 4.9 cm (axial series 5, image 200). Celiac: Patent without evidence of aneurysm, dissection, vasculitis or significant stenosis. SMA: The proximal SMA is patent. There is  a focal saccular aneurysm arising from a branch of the SMA measuring approximately 7 mm (axial series 5, image 182). This finding was present on CT from 2013 and has demonstrated minimal interval increase in size and therefore is felt to be insignificant. Renals: Both renal arteries are patent without evidence of  aneurysm, dissection, vasculitis, fibromuscular dysplasia or significant stenosis. IMA: The IMA is occluded. Inflow: The right common iliac artery is ectatic measuring 1.9 cm. The left common iliac artery is ectatic measuring approximately 1.9 cm there is an age-indeterminate occlusion of the right external iliac artery. This artery was patent in 2013. The left internal iliac artery is ectatic measuring up to approximately 1.7 cm. The right CFA is patent. The visualized portions of the right profundus femoris and SFA are patent. The left SFA, CFA a, and profundus femoris arteries are all patent. Veins: No obvious venous abnormality within the limitations of this arterial phase study. Review of the MIP images confirms the above findings. NON-VASCULAR Hepatobiliary: The liver is normal. Normal gallbladder.There is no biliary ductal dilation. Pancreas: There is a cystic appearing lesion in the pancreatic body measuring approximately 1.5 cm (axial series 5, image 155). This lesion appears to be new since 2019. Spleen: Unremarkable. Adrenals/Urinary Tract: --Adrenal glands: Unremarkable. --Right kidney/ureter: No hydronephrosis or radiopaque kidney stones. --Left kidney/ureter: There is a renal cyst arising from the lower pole measuring approximately 5.1 cm. --Urinary bladder: There is an apparent filling defect involving the posterior dependent portion of the urinary bladder (axial series 5, image 258). Stomach/Bowel: --Stomach/Duodenum: No hiatal hernia or other gastric abnormality. Normal duodenal course and caliber. --Small bowel: Unremarkable. --Colon: Unremarkable. --Appendix: Not visualized. No right lower quadrant inflammation or free fluid. Lymphatic: --No retroperitoneal lymphadenopathy. --No mesenteric lymphadenopathy. --No pelvic or inguinal lymphadenopathy. Reproductive: Unremarkable Other: No ascites or free air. The abdominal wall is normal. Musculoskeletal. There are degenerative changes of the lumbar  spine. There is no acute displaced fracture. Review of the MIP images confirms the above findings. IMPRESSION: 1. There is no evidence for a thoracic aortic dissection. 2. Again noted is a saccular aneurysm arising from the distal aortic arch/isthmus. This is stable from the patient's recent prior study in July and currently measures approximately 5.9 cm. Cardiothoracic surgery consultation recommended due to increased risk of rupture for arch aneurysm ? 5.5 cm. This recommendation follows 2010 ACCF/AHA/AATS/ACR/ASA/SCA/SCAI/SIR/STS/SVM Guidelines for the Diagnosis and Management of Patients With Thoracic Aortic Disease. Circulation. 2010; 121: D220-U542. Aortic aneurysm NOS (ICD10-I71.9) 3. Infrarenal abdominal aortic aneurysm measuring up to 5.4 cm. Vascular surgery consultation is recommended. 4. 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 This artery was patent in 2013. 5. There is a cystic appearing lesion in the pancreatic body measuring up to 1.5 cm. This lesion appears to be new since 2019. A follow-up contrast enhanced MRI is recommended in 2 years for further evaluation of this finding. 6. There is an apparent filling defect involving the posterior dependent portion of the urinary bladder. While this could represent a blood clot, a mass is not excluded. Correlation with cystoscopy and urinalysis is recommended. Aortic Atherosclerosis (ICD10-I70.0) and Emphysema (ICD10-J43.9). These results will be called to the ordering clinician or representative by the Radiologist Assistant, and communication documented in the PACS or [employer]. Electronically Signed   By: Constance Holster M.D.   On: 01/01/2020 15:35      I have independently reviewed the above radiology studies  and reviewed the findings with the patient.   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 Trula Slade vascular surgery who is also seen the patient. With the patient's severe underlying coronary disease, continued smoking, oxygen dependence and the need to perform interventional procedure ideally through the chest increase the risk of direct surgical treatment of the aneurysm.  This is been discussed in detail with the patient and the family member with him today.  Patient did not wish to proceed with this time and will discuss the situation with his daughters I have told him I would be glad to talk to his other family members by phone or in person about the current diagnosis and treatment options.  He is agreeable to a follow-up CT scan will plan for 5 months.   Grace Isaac MD      [address] [place] [address] Office [phone]     02/06/2020 10:56 AM

## 2020-01-25 ENCOUNTER — Other Ambulatory Visit: Payer: Self-pay

## 2020-01-25 ENCOUNTER — Ambulatory Visit (INDEPENDENT_AMBULATORY_CARE_PROVIDER_SITE_OTHER): Payer: Medicare Other | Admitting: Cardiothoracic Surgery

## 2020-01-25 VITALS — BP 155/90 | HR 88 | Temp 97.7°F | Resp 20 | Ht 68.0 in | Wt 120.0 lb

## 2020-01-25 DIAGNOSIS — I712 Thoracic aortic aneurysm, without rupture, unspecified: Secondary | ICD-10-CM

## 2020-02-01 ENCOUNTER — Telehealth: Payer: Self-pay

## 2020-02-01 NOTE — Telephone Encounter (Signed)
Communication received by Dr. Everrett Coombe office. Arch debranching and TEVAR will be postponed for now. Informed by R. Brooks that Dr. Servando Snare will have pt follow up in 5 months for repeat CTA chest. Dr. Trula Slade updated with this information.

## 2020-02-19 ENCOUNTER — Encounter: Payer: Medicare Other | Admitting: Surgery

## 2020-06-10 ENCOUNTER — Other Ambulatory Visit: Payer: Self-pay | Admitting: *Deleted

## 2020-06-10 DIAGNOSIS — I712 Thoracic aortic aneurysm, without rupture, unspecified: Secondary | ICD-10-CM

## 2020-06-22 ENCOUNTER — Emergency Department (HOSPITAL_COMMUNITY): Payer: Medicare Other

## 2020-06-22 ENCOUNTER — Other Ambulatory Visit: Payer: Self-pay

## 2020-06-22 ENCOUNTER — Emergency Department
Admission: EM | Admit: 2020-06-22 | Discharge: 2020-06-22 | Disposition: A | Discharge status: Other Acute Inpatient Hospital | Payer: Medicare Other | Attending: Emergency Medicine | Admitting: Emergency Medicine

## 2020-06-22 ENCOUNTER — Encounter (HOSPITAL_COMMUNITY)
Admission: EM | Disposition: A | Discharge status: Skilled Nursing Facility | Payer: Self-pay | Source: Other Acute Inpatient Hospital | Attending: Vascular Surgery

## 2020-06-22 ENCOUNTER — Inpatient Hospital Stay (HOSPITAL_COMMUNITY): Payer: Medicare Other | Admitting: Anesthesiology

## 2020-06-22 ENCOUNTER — Inpatient Hospital Stay (HOSPITAL_COMMUNITY)
Admission: EM | Admit: 2020-06-22 | Discharge: 2020-06-29 | DRG: 269 | Disposition: A | Discharge status: Skilled Nursing Facility | Payer: Medicare Other | Source: Other Acute Inpatient Hospital | Attending: Vascular Surgery | Admitting: Vascular Surgery

## 2020-06-22 ENCOUNTER — Emergency Department: Payer: Medicare Other

## 2020-06-22 ENCOUNTER — Encounter (HOSPITAL_COMMUNITY): Payer: Self-pay | Admitting: Emergency Medicine

## 2020-06-22 DIAGNOSIS — J441 Chronic obstructive pulmonary disease with (acute) exacerbation: Secondary | ICD-10-CM | POA: Diagnosis not present

## 2020-06-22 DIAGNOSIS — I714 Abdominal aortic aneurysm, without rupture, unspecified: Secondary | ICD-10-CM

## 2020-06-22 DIAGNOSIS — R1031 Right lower quadrant pain: Secondary | ICD-10-CM

## 2020-06-22 DIAGNOSIS — E785 Hyperlipidemia, unspecified: Secondary | ICD-10-CM | POA: Diagnosis present

## 2020-06-22 DIAGNOSIS — Z20822 Contact with and (suspected) exposure to covid-19: Secondary | ICD-10-CM | POA: Diagnosis present

## 2020-06-22 DIAGNOSIS — N390 Urinary tract infection, site not specified: Secondary | ICD-10-CM | POA: Diagnosis not present

## 2020-06-22 DIAGNOSIS — Z7951 Long term (current) use of inhaled steroids: Secondary | ICD-10-CM | POA: Diagnosis not present

## 2020-06-22 DIAGNOSIS — K219 Gastro-esophageal reflux disease without esophagitis: Secondary | ICD-10-CM | POA: Diagnosis present

## 2020-06-22 DIAGNOSIS — Z79899 Other long term (current) drug therapy: Secondary | ICD-10-CM | POA: Insufficient documentation

## 2020-06-22 DIAGNOSIS — F17210 Nicotine dependence, cigarettes, uncomplicated: Secondary | ICD-10-CM | POA: Diagnosis present

## 2020-06-22 DIAGNOSIS — I712 Thoracic aortic aneurysm, without rupture: Secondary | ICD-10-CM | POA: Diagnosis present

## 2020-06-22 DIAGNOSIS — N35014 Post-traumatic urethral stricture, male, unspecified: Secondary | ICD-10-CM | POA: Diagnosis not present

## 2020-06-22 DIAGNOSIS — J449 Chronic obstructive pulmonary disease, unspecified: Secondary | ICD-10-CM | POA: Diagnosis present

## 2020-06-22 DIAGNOSIS — I10 Essential (primary) hypertension: Secondary | ICD-10-CM | POA: Diagnosis present

## 2020-06-22 DIAGNOSIS — E7800 Pure hypercholesterolemia, unspecified: Secondary | ICD-10-CM | POA: Diagnosis present

## 2020-06-22 DIAGNOSIS — Z8546 Personal history of malignant neoplasm of prostate: Secondary | ICD-10-CM | POA: Diagnosis not present

## 2020-06-22 DIAGNOSIS — Z8679 Personal history of other diseases of the circulatory system: Secondary | ICD-10-CM

## 2020-06-22 DIAGNOSIS — Z9981 Dependence on supplemental oxygen: Secondary | ICD-10-CM

## 2020-06-22 DIAGNOSIS — Z950 Presence of cardiac pacemaker: Secondary | ICD-10-CM | POA: Insufficient documentation

## 2020-06-22 DIAGNOSIS — Z9889 Other specified postprocedural states: Secondary | ICD-10-CM

## 2020-06-22 DIAGNOSIS — I745 Embolism and thrombosis of iliac artery: Secondary | ICD-10-CM | POA: Diagnosis present

## 2020-06-22 HISTORY — PX: INSERTION OF SUPRAPUBIC CATHETER: SHX5870

## 2020-06-22 HISTORY — PX: CYSTOSCOPY WITH URETHRAL DILATATION: SHX5125

## 2020-06-22 HISTORY — PX: ABDOMINAL AORTIC ENDOVASCULAR STENT GRAFT: SHX5707

## 2020-06-22 HISTORY — PX: INSERTION OF ILIAC STENT: SHX6256

## 2020-06-22 LAB — COMPREHENSIVE METABOLIC PANEL
ALT: 13 U/L (ref 0–44)
AST: 20 U/L (ref 15–41)
Albumin: 3.5 g/dL (ref 3.5–5.0)
Alkaline Phosphatase: 63 U/L (ref 38–126)
Anion gap: 11 (ref 5–15)
BUN: 15 mg/dL (ref 8–23)
CO2: 27 mmol/L (ref 22–32)
Calcium: 9.4 mg/dL (ref 8.9–10.3)
Chloride: 93 mmol/L — ABNORMAL LOW (ref 98–111)
Creatinine, Ser: 1.02 mg/dL (ref 0.61–1.24)
GFR, Estimated: >60 mL/min (ref >60)
Glucose, Bld: 95 mg/dL (ref 70–99)
Potassium: 4.1 mmol/L (ref 3.5–5.1)
Sodium: 131 mmol/L — ABNORMAL LOW (ref 135–145)
Total Bilirubin: 1.5 mg/dL — ABNORMAL HIGH (ref 0.3–1.2)
Total Protein: 6.9 g/dL (ref 6.5–8.1)

## 2020-06-22 LAB — URINALYSIS, COMPLETE (UACMP) WITH MICROSCOPIC
Bilirubin Urine: NEGATIVE
Glucose, UA: NEGATIVE mg/dL
Ketones, ur: NEGATIVE mg/dL
Nitrite: NEGATIVE
Protein, ur: NEGATIVE mg/dL
Specific Gravity, Urine: 1.018 (ref 1.005–1.030)
pH: 5 (ref 5.0–8.0)

## 2020-06-22 LAB — TYPE AND SCREEN
ABO/RH(D): A POS
Antibody Screen: NEGATIVE

## 2020-06-22 LAB — CBC
HCT: 40.6 % (ref 39.0–52.0)
Hemoglobin: 13.4 g/dL (ref 13.0–17.0)
MCH: 30.9 pg (ref 26.0–34.0)
MCHC: 33 g/dL (ref 30.0–36.0)
MCV: 93.8 fL (ref 80.0–100.0)
Platelets: 192 10*3/uL (ref 150–400)
RBC: 4.33 MIL/uL (ref 4.22–5.81)
RDW: 12.5 % (ref 11.5–15.5)
WBC: 4.5 10*3/uL (ref 4.0–10.5)
nRBC: 0 % (ref 0.0–0.2)

## 2020-06-22 LAB — LIPASE, BLOOD: Lipase: 22 U/L (ref 11–51)

## 2020-06-22 LAB — SARS CORONAVIRUS 2 BY RT PCR (HOSPITAL ORDER, PERFORMED IN ~~LOC~~ HOSPITAL LAB): SARS Coronavirus 2: NEGATIVE

## 2020-06-22 SURGERY — INSERTION, ENDOVASCULAR STENT GRAFT, AORTA, ABDOMINAL
Anesthesia: General | Site: Penis | Laterality: Right

## 2020-06-22 MED ORDER — PHENYLEPHRINE 40 MCG/ML (10ML) SYRINGE FOR IV PUSH (FOR BLOOD PRESSURE SUPPORT)
PREFILLED_SYRINGE | INTRAVENOUS | Status: AC
  Filled 2020-06-22: qty 10

## 2020-06-22 MED ORDER — EPHEDRINE 5 MG/ML INJ
INTRAVENOUS | Status: AC
  Filled 2020-06-22: qty 10

## 2020-06-22 MED ORDER — LIDOCAINE HCL (CARDIAC) PF 100 MG/5ML IV SOSY
PREFILLED_SYRINGE | INTRAVENOUS | Status: DC | PRN
  Administered 2020-06-22: 60 mg via INTRAVENOUS

## 2020-06-22 MED ORDER — SODIUM CHLORIDE 0.9 % IV SOLN
1.0000 g | Freq: Once | INTRAVENOUS | Status: AC
  Administered 2020-06-22: 1 g via INTRAVENOUS
  Filled 2020-06-22: qty 10

## 2020-06-22 MED ORDER — ROCURONIUM 10MG/ML (10ML) SYRINGE FOR MEDFUSION PUMP - OPTIME
INTRAVENOUS | Status: DC | PRN
  Administered 2020-06-22: 50 mg via INTRAVENOUS
  Administered 2020-06-23: 20 mg via INTRAVENOUS

## 2020-06-22 MED ORDER — IOHEXOL 350 MG/ML SOLN
75.0000 mL | Freq: Once | INTRAVENOUS | Status: AC | PRN
  Administered 2020-06-22: 75 mL via INTRAVENOUS

## 2020-06-22 MED ORDER — PROPOFOL 10 MG/ML IV BOLUS
INTRAVENOUS | Status: DC | PRN
  Administered 2020-06-22: 50 mg via INTRAVENOUS

## 2020-06-22 MED ORDER — SODIUM CHLORIDE 0.9 % IV BOLUS
500.0000 mL | Freq: Once | INTRAVENOUS | Status: AC
  Administered 2020-06-22: 500 mL via INTRAVENOUS

## 2020-06-22 MED ORDER — CEFAZOLIN SODIUM-DEXTROSE 2-4 GM/100ML-% IV SOLN
INTRAVENOUS | Status: AC
  Filled 2020-06-22: qty 100

## 2020-06-22 MED ORDER — FENTANYL CITRATE (PF) 100 MCG/2ML IJ SOLN
50.0000 ug | Freq: Once | INTRAMUSCULAR | Status: AC
  Administered 2020-06-22: 50 ug via INTRAVENOUS
  Filled 2020-06-22: qty 2

## 2020-06-22 MED ORDER — MIDAZOLAM HCL 2 MG/2ML IJ SOLN
INTRAMUSCULAR | Status: AC
  Filled 2020-06-22: qty 2

## 2020-06-22 MED ORDER — VASOPRESSIN 20 UNIT/ML IV SOLN
INTRAVENOUS | Status: AC
  Filled 2020-06-22: qty 1

## 2020-06-22 MED ORDER — FENTANYL CITRATE (PF) 250 MCG/5ML IJ SOLN
INTRAMUSCULAR | Status: AC
  Filled 2020-06-22: qty 5

## 2020-06-22 MED ORDER — PHENYLEPHRINE 40 MCG/ML (10ML) SYRINGE FOR IV PUSH (FOR BLOOD PRESSURE SUPPORT)
PREFILLED_SYRINGE | INTRAVENOUS | Status: DC | PRN
  Administered 2020-06-22 (×2): 120 ug via INTRAVENOUS
  Administered 2020-06-22: 160 ug via INTRAVENOUS

## 2020-06-22 MED ORDER — FENTANYL CITRATE (PF) 250 MCG/5ML IJ SOLN
INTRAMUSCULAR | Status: DC | PRN
  Administered 2020-06-22: 100 ug via INTRAVENOUS
  Administered 2020-06-23 (×2): 25 ug via INTRAVENOUS

## 2020-06-22 MED ORDER — PHENYLEPHRINE HCL-NACL 10-0.9 MG/250ML-% IV SOLN
INTRAVENOUS | Status: DC | PRN
  Administered 2020-06-22: 100 ug/min via INTRAVENOUS
  Administered 2020-06-23: 75 ug/min via INTRAVENOUS

## 2020-06-22 SURGICAL SUPPLY — 103 items
BAG DECANTER FOR FLEXI CONT (MISCELLANEOUS) IMPLANT
BAG URINE DRAIN 2000ML AR STRL (UROLOGICAL SUPPLIES) ×6 IMPLANT
BALLN CODA OCL 2-9.0-35-120-3 (BALLOONS)
BALLN MUSTANG 10X80X75 (BALLOONS) ×6
BALLN MUSTANG 8X60X75 (BALLOONS) ×6
BALLN NEPHROSTOMY (BALLOONS) ×6
BALLOON COD OCL 2-9.0-35-120-3 (BALLOONS) IMPLANT
BALLOON MUSTANG 10X80X75 (BALLOONS) ×5 IMPLANT
BALLOON MUSTANG 8X60X75 (BALLOONS) ×5 IMPLANT
BALLOON NEPHROSTOMY (BALLOONS) ×5 IMPLANT
BENZOIN TINCTURE PRP APPL 2/3 (GAUZE/BANDAGES/DRESSINGS) ×6 IMPLANT
CANISTER SUCT 3000ML PPV (MISCELLANEOUS) ×6 IMPLANT
CATH BEACON 5 .035 65 KMP TIP (CATHETERS) ×6 IMPLANT
CATH COUDE FOLEY 5CC 14FR (CATHETERS) ×6 IMPLANT
CATH OMNI FLUSH .035X70CM (CATHETERS) ×6 IMPLANT
CATH QUICKCROSS SUPP .035X90CM (MICROCATHETER) IMPLANT
CHLORAPREP W/TINT 26 (MISCELLANEOUS) ×6 IMPLANT
CLIP VESOCCLUDE MED 6/CT (CLIP) ×6 IMPLANT
CLIP VESOCCLUDE SM WIDE 24/CT (CLIP) ×6 IMPLANT
CLOSURE PERCLOSE PROSTYLE (VASCULAR PRODUCTS) ×24 IMPLANT
COVER WAND RF STERILE (DRAPES) ×6 IMPLANT
DERMABOND ADVANCED (GAUZE/BANDAGES/DRESSINGS) ×2
DERMABOND ADVANCED .7 DNX12 (GAUZE/BANDAGES/DRESSINGS) ×10 IMPLANT
DEVICE CLOSURE PERCLS PRGLD 6F (VASCULAR PRODUCTS) IMPLANT
DEVICE INFLATION ENCORE 26 (MISCELLANEOUS) ×6 IMPLANT
DEVICE TORQUE H2O (MISCELLANEOUS) IMPLANT
DEVICE TORQUE KENDALL .025-038 (MISCELLANEOUS) ×6 IMPLANT
DRAPE ZERO GRAVITY STERILE (DRAPES) ×6 IMPLANT
DRSG TEGADERM 2-3/8X2-3/4 SM (GAUZE/BANDAGES/DRESSINGS) ×12 IMPLANT
DRYSEAL FLEXSHEATH 12FR 33CM (SHEATH) ×1
DRYSEAL FLEXSHEATH 18FR 33CM (SHEATH) ×1
ELECT REM PT RETURN 9FT ADLT (ELECTROSURGICAL) ×12
ELECTRODE REM PT RTRN 9FT ADLT (ELECTROSURGICAL) ×10 IMPLANT
EXCLDR TRNK 28.5X14.5X12 16F (Endovascular Graft) ×6 IMPLANT
EXCLUDER TNK 28.5X14.5X12 16F (Endovascular Graft) ×5 IMPLANT
GAUZE SPONGE 2X2 8PLY STRL LF (GAUZE/BANDAGES/DRESSINGS) ×10 IMPLANT
GLIDEWIRE ANGLED NITR .018X260 (WIRE) IMPLANT
GLOVE SURG SS PI 8.0 STRL IVOR (GLOVE) ×6 IMPLANT
GOWN STRL REUS W/ TWL LRG LVL3 (GOWN DISPOSABLE) ×10 IMPLANT
GOWN STRL REUS W/ TWL XL LVL3 (GOWN DISPOSABLE) ×10 IMPLANT
GOWN STRL REUS W/TWL LRG LVL3 (GOWN DISPOSABLE) ×12
GOWN STRL REUS W/TWL XL LVL3 (GOWN DISPOSABLE) ×12
GRAFT BALLN CATH 65CM (STENTS) ×5 IMPLANT
GUIDEWIRE ANGLED .035X260CM (WIRE) ×6 IMPLANT
GUIDEWIRE STR DUAL SENSOR (WIRE) ×6 IMPLANT
HEMOSTAT SNOW SURGICEL 2X4 (HEMOSTASIS) ×6 IMPLANT
KIT BASIN OR (CUSTOM PROCEDURE TRAY) ×6 IMPLANT
KIT ENCORE 26 ADVANTAGE (KITS) ×6 IMPLANT
KIT TURNOVER KIT B (KITS) ×6 IMPLANT
LEG CONTRALATERAL 16X20X9.5 (Endovascular Graft) ×6 IMPLANT
LEG CONTRALATERAL 20X11.5 (Vascular Products) ×6 IMPLANT
LOOP VESSEL MAXI BLUE (MISCELLANEOUS) ×12 IMPLANT
LOOP VESSEL MINI RED (MISCELLANEOUS) ×12 IMPLANT
NEEDLE PERC 18GX7CM (NEEDLE) IMPLANT
NS IRRIG 1000ML POUR BTL (IV SOLUTION) ×6 IMPLANT
PACK CYSTO (CUSTOM PROCEDURE TRAY) ×6 IMPLANT
PACK ENDOVASCULAR (PACKS) ×6 IMPLANT
PAD ARMBOARD 7.5X6 YLW CONV (MISCELLANEOUS) ×12 IMPLANT
PENCIL SMOKE EVACUATOR (MISCELLANEOUS) IMPLANT
PERCLOSE PROGLIDE 6F (VASCULAR PRODUCTS)
SET MICROPUNCTURE 5F STIFF (MISCELLANEOUS) ×6 IMPLANT
SHEATH BRITE TIP 8FR 23CM (SHEATH) ×6 IMPLANT
SHEATH BRITE TIP 8FR 35CM (SHEATH) ×6 IMPLANT
SHEATH DRYSEAL FLEX 12FR 33CM (SHEATH) ×5 IMPLANT
SHEATH DRYSEAL FLEX 18FR 33CM (SHEATH) ×5 IMPLANT
SHEATH GUIDING 7F 55X73X9MM TD (SHEATH) IMPLANT
SHEATH HIGHFLEX ANSEL 7FR 55CM (SHEATH) ×6 IMPLANT
SHEATH PINNACLE 5F 10CM (SHEATH) ×6 IMPLANT
SHEATH PINNACLE 8F 10CM (SHEATH) ×6 IMPLANT
SPONGE GAUZE 2X2 STER 10/PKG (GAUZE/BANDAGES/DRESSINGS) ×2
STENT GRAFT BALLN CATH 65CM (STENTS) ×1
STENT GRAFT CONTRALAT 16X20X9. (Endovascular Graft) ×5 IMPLANT
STENT GRAFT CONTRALAT 20X11.5 (Vascular Products) ×5 IMPLANT
STENT VIABAHN 11X5X120 (Permanent Stent) ×6 IMPLANT
STOPCOCK MORSE 400PSI 3WAY (MISCELLANEOUS) ×12 IMPLANT
STRIP CLOSURE SKIN 1/2X4 (GAUZE/BANDAGES/DRESSINGS) ×6 IMPLANT
SUT MNCRL AB 4-0 PS2 18 (SUTURE) ×18 IMPLANT
SUT PROLENE 5 0 C 1 24 (SUTURE) IMPLANT
SUT PROLENE 6 0 BV (SUTURE) ×60 IMPLANT
SUT SILK 2 0 (SUTURE) ×6
SUT SILK 2-0 18XBRD TIE 12 (SUTURE) ×5 IMPLANT
SUT SILK 3 0 (SUTURE) ×6
SUT SILK 3-0 18XBRD TIE 12 (SUTURE) ×5 IMPLANT
SUT SILK 4 0 (SUTURE) ×6
SUT SILK 4-0 18XBRD TIE 12 (SUTURE) ×5 IMPLANT
SUT VIC AB 2-0 CT1 27 (SUTURE)
SUT VIC AB 2-0 CT1 TAPERPNT 27 (SUTURE) IMPLANT
SUT VIC AB 2-0 SH 27 (SUTURE) ×24
SUT VIC AB 2-0 SH 27XBRD (SUTURE) ×20 IMPLANT
SUT VIC AB 3-0 SH 27 (SUTURE) ×12
SUT VIC AB 3-0 SH 27X BRD (SUTURE) ×10 IMPLANT
SYR 30ML LL (SYRINGE) IMPLANT
SYR BULB IRRIG 60ML STRL (SYRINGE) ×6 IMPLANT
TOWEL GREEN STERILE (TOWEL DISPOSABLE) ×6 IMPLANT
TRAY FOLEY MTR SLVR 16FR STAT (SET/KITS/TRAYS/PACK) ×6 IMPLANT
TUBE CONNECTING 20X1/4 (TUBING) ×6 IMPLANT
TUBING HIGH PRESSURE 120CM (CONNECTOR) ×6 IMPLANT
TUBING INJECTOR 48 (MISCELLANEOUS) ×6 IMPLANT
WIRE AMPLATZ SS-J .035X180CM (WIRE) ×12 IMPLANT
WIRE BENTSON .035X145CM (WIRE) ×12 IMPLANT
WIRE LUNDERQUIST .035X180CM (WIRE) ×6 IMPLANT
WIRE ROSEN-J .035X260CM (WIRE) ×12 IMPLANT
WIRE STIFF LUNDERQUIST 260MM (WIRE) ×12 IMPLANT

## 2020-06-22 NOTE — ED Provider Notes (Signed)
[name] Emergency Department Provider Note   ____________________________________________   I have reviewed the triage vital signs and the nursing notes.   HISTORY  Chief Complaint Abdominal Pain   History limited by: Not Limited   HPI James Dickerson is a 80 y.o. male who presents to the emergency department today because of concerns for abdominal pain.  Located in the right lower quadrant he states the pain started 2 days ago.  Has been somewhat constant.  He has not identified anything that makes it better or worse.  The patient states that he has not had a bowel movement since the pain started and feels like it is slightly harder for him to urinate although he denies any dysuria or bad odor to his urine.  Patient denies any nausea or vomiting.  Denies any fevers.  Denies similar symptoms in the past.  States he had his appendix removed roughly 6 or 7 years ago.  Records reviewed. Per medical record review patient has a history of cystic kidney disease, appendectomy  Past Medical History:  Diagnosis Date  . Back pain   . Cardiomegaly   . COPD (chronic obstructive pulmonary disease) ([place])   . Cystic kidney disease   . Depressive disorder   . GERD (gastroesophageal reflux disease)   . High cholesterol   . History of prostate cancer   . Hypertension   . Prostate cancer ([place]) 2018   Rad seeds  . Thyroid nodule     Patient Active Problem List   Diagnosis Date Noted  . Thoracic aortic aneurysm without rupture ([place]) 12/18/2019  . Respiratory failure ([place]) 07/11/2017  . Acute respiratory failure ([place]) 07/10/2017  . CAP (community acquired pneumonia) 07/10/2017  . Influenza A 07/10/2017  . COPD exacerbation ([place]) 07/10/2017  . HLD (hyperlipidemia) 03/09/2017  . Essential hypertension 08/14/2016  . 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 Bradycardia 08/05/2016    Past Surgical History:  Procedure Laterality Date  . APPENDECTOMY   1971  . PACEMAKER INSERTION N/A 08/06/2016   Procedure: INSERTION PACEMAKER;  Surgeon: Isaias Cowman, MD;  Location: [facility] ORS;  Service: Cardiovascular;  Laterality: N/A;    Prior to Admission medications   Medication Sig Start Date End Date Taking? Authorizing Provider  amLODipine (NORVASC) 5 MG tablet Take 5 mg by mouth every morning. 09/20/19   [provider]  atorvastatin (LIPITOR) 40 MG tablet Take 1 tablet (40 mg total) by mouth at bedtime. 06/17/18   Mayo, Pete Pelt, MD  budesonide-formoterol [facility]) 160-4.5 MCG/ACT inhaler Inhale 2 puffs into the lungs 2 (two) times daily. 02/13/18   Merlyn Lot, MD  Cholecalciferol (VITAMIN D3) 50 MCG (2000 UT) CHEW Chew 2,000 Units by mouth daily.    [provider]  donepezil (ARICEPT) 5 MG tablet Take 5 mg by mouth at bedtime.    [provider]  ipratropium-albuterol (DUONEB) 0.5-2.5 (3) MG/3ML SOLN SMARTSIG:3 Milliliter(s) Via Nebulizer Every 6 Hours 01/21/20   [provider]  OXYGEN Inhale 2-3 L into the lungs as needed.    [provider]  pantoprazole (PROTONIX) 40 MG tablet Take 40 mg by mouth 2 (two) times daily. 10/18/19   [provider]  tamsulosin (FLOMAX) 0.4 MG CAPS capsule Take 0.4 mg by mouth daily. 10/14/19   [provider]  valsartan (DIOVAN) 160 MG tablet Take 160 mg by mouth daily. 05/04/18   [provider]    Allergies Patient has no known allergies.  No family history  on file.  Social History Social History   Tobacco Use  . Smoking status: Current Every Day Smoker    Packs/day: 0.25    Years: 67.00    Pack years: 16.75    Types: Cigarettes  . Smokeless tobacco: Never Used  . Tobacco comment: 3 every other day  Vaping Use  . Vaping Use: Never used  Substance Use Topics  . Alcohol use: No  . Drug use: No    Review of Systems Constitutional: No fever/chills Eyes: No visual changes. ENT: No sore throat. Cardiovascular: Denies  chest pain. Respiratory: Denies shortness of breath. Gastrointestinal: Positive for right lower quadrant pain. Genitourinary: Negative for dysuria. Musculoskeletal: Negative for back pain. Skin: Negative for rash. Neurological: Negative for headaches, focal weakness or numbness.  ____________________________________________   PHYSICAL EXAM:  VITAL SIGNS: ED Triage Vitals  Enc Vitals Group     BP 06/22/20 1403 (!) 158/90     Pulse Rate 06/22/20 1403 73     Resp 06/22/20 1403 17     Temp 06/22/20 1403 98.2 F (36.8 C)     Temp Source 06/22/20 1403 Oral     SpO2 06/22/20 1403 98 %     Weight 06/22/20 1404 128 lb (58.1 kg)     Height 06/22/20 1404 5\' 3"  (1.6 m)     Head Circumference --      Peak Flow --      Pain Score 06/22/20 1403 9   Constitutional: Alert and oriented.  Eyes: Conjunctivae are normal.  ENT      Head: Normocephalic and atraumatic.      Nose: No congestion/rhinnorhea.      Mouth/Throat: Mucous membranes are moist.      Neck: No stridor. Hematological/Lymphatic/Immunilogical: No cervical lymphadenopathy. Cardiovascular: Normal rate, regular rhythm.  No murmurs, rubs, or gallops.  Respiratory: Normal respiratory effort without tachypnea nor retractions. Breath sounds are clear and equal bilaterally. No wheezes/rales/rhonchi. Gastrointestinal: Soft and tender to palpation in the right lower quadrant.  Genitourinary: Deferred Musculoskeletal: Normal range of motion in all extremities. No lower extremity edema. Neurologic:  Normal speech and language. No gross focal neurologic deficits are appreciated.  Skin:  Skin is warm, dry and intact. No rash noted. Psychiatric: Mood and affect are normal. Speech and behavior are normal. Patient exhibits appropriate insight and judgment.  ____________________________________________    LABS (pertinent positives/negatives)  Lipase 22 CMP na 131, k 4.1, glu 95, cr 1.02 CBC wbc 4.5, hgb 13.4, plt 192 UA hazy, small hgb  dipstick, 6-10 rbc, 11-20 wbc, rare bacteria  ____________________________________________   EKG  None  ____________________________________________    RADIOLOGY  CT angio Aneurysmal dilatation of the infrarenal abdominal aorta to a  greatest size of 5.9 x 5.5 cm, increased in size since the previous  exam, with significant thrombus within as well as a high attenuation  crescent within the aneurysm on the earlier noncontrast exam  suggesting acute intramural hematoma.   CT renal No acute findings. Enlargement of known of AAA  ____________________________________________   PROCEDURES  Procedures  ____________________________________________   INITIAL IMPRESSION / ASSESSMENT AND PLAN / ED COURSE  Pertinent labs & imaging results that were available during my care of the patient were reviewed by me and considered in my medical decision making (see chart for details).   Patient presented to the emergency department today because of concerns for right lower abdominal pain that is been present for the past few days.  Patient did have some white blood cells  and red blood cells in the urine.  Patient had concern for potential kidney stone given the patient's pain is located.  CT however without kidney stones but did show enlargement of known AAA.  CT angio was performed which showed intramural hematoma.  Discussed with Dr. Lorenso Courier with vascular surgery who felt he would benefit from higher level of care given complication of concurrent thoracic aneurysm.  Discussed with Dr. Luan Pulling with vascular surgeon at [facility] in [place].  Will plan on transferring to [facility] facility.  Discussed with patient.  ____________________________________________   FINAL CLINICAL IMPRESSION(S) / ED DIAGNOSES  Final diagnoses:  Lower urinary tract infectious disease  Right lower quadrant pain  Abdominal aortic aneurysm (AAA) without rupture [facility])     Note: This dictation was prepared with  Dragon dictation. Any transcriptional errors that result from this process are unintentional     Nance Pear, MD 06/22/20 2039

## 2020-06-22 NOTE — Anesthesia Preprocedure Evaluation (Addendum)
Anesthesia Evaluation  Patient identified by MRN, date of birth, ID band Patient awake    Reviewed: Allergy & Precautions, NPO status , Patient's Chart, lab work & pertinent test results  Airway Mallampati: III  TM Distance: >3 FB Neck ROM: Full    Dental  (+) Upper Dentures, Partial Lower   Pulmonary COPD,  COPD inhaler and oxygen dependent, Current SmokerPatient did not abstain from smoking.,  hoarseness   Pulmonary exam normal breath sounds clear to auscultation       Cardiovascular hypertension, Pt. on medications Normal cardiovascular exam Rhythm:Regular Rate:Normal     Neuro/Psych PSYCHIATRIC DISORDERS Depression negative neurological ROS     GI/Hepatic Neg liver ROS, GERD  Medicated and Controlled,  Endo/Other  Hyponatremia: 131  Renal/GU Renal disease     Musculoskeletal Back pain   Abdominal   Peds  Hematology HLD   Anesthesia Other Findings symptomatic infrarenal abdominal aortic aneurysm  Reproductive/Obstetrics                            Anesthesia Physical Anesthesia Plan  ASA: IV and emergent  Anesthesia Plan: General   Post-op Pain Management:    Induction: Intravenous  PONV Risk Score and Plan: 1 and Ondansetron, Dexamethasone and Treatment may vary due to age or medical condition  Airway Management Planned: Oral ETT  Additional Equipment: Arterial line  Intra-op Plan:   Post-operative Plan: Possible Post-op intubation/ventilation and Extubation in OR  Informed Consent: I have reviewed the patients History and Physical, chart, labs and discussed the procedure including the risks, benefits and alternatives for the proposed anesthesia with the patient or authorized representative who has indicated his/her understanding and acceptance.     Dental advisory given and Only emergency history available  Plan Discussed with: CRNA  Anesthesia Plan Comments:         Anesthesia Quick Evaluation

## 2020-06-22 NOTE — ED Triage Notes (Signed)
Pt comes into the ED via EMS from home with c/o RLQ pain x1 day, took some excedrin extra strength yesterday that eased the pain, denies N/V/D.Marland Kitchen  183/102 [id number]

## 2020-06-22 NOTE — ED Provider Notes (Signed)
[facility] EMERGENCY DEPARTMENT Provider Note   CSN: [id number] Arrival date & time: 06/22/20  2134     History Chief Complaint  Patient presents with  . AAA    [facility] is a 80 y.o. male who presents via EMS on transfer from [facility] for concern of enlarging AAA.  Patient with history of 2 days of right lower quadrant abdominal pain that has been constant as well as central low back pain. Patient was not evaluated at [facility] and was found to have an enlarging AAA on Noncon CT of James abdomen and pelvis. CT angiogram was performed of James abdomen and pelvis which revealed infrarenal abdominal aortic aneurysmal dilation to 5.9 x 5.5 cm, increased from previous. There is significant thrombus within as well as high attenuation suggestive of an acute intramural hematoma. 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 Transport was arranged from [facility] to [facility] for higher level of care, accepting physician vascular surgeon Dr.Hawken.   EMS report was taken by this provider from EMTs, who endorsed dosage of 10 mg of labetalol in route, patient remains hypertensive to James [id number] systolic. He continues to endorse low back pain and right lower abdominal pain. Denies chest pain but endorses mild nausea, denies shortness of breath or palpitations.  I personally reviewed this patient's medical records. History of COPD, hypertension, Mobitz type II second-degree heart block, hyperlipidemia, and known abdominal aortic aneurysm. 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  HPI     Past Medical History:  Diagnosis Date  . Back pain   . Cardiomegaly   . COPD (chronic obstructive pulmonary disease) ([place])   . Cystic kidney disease   . Depressive disorder   . GERD (gastroesophageal reflux disease)   . High cholesterol   . History of prostate cancer   . Hypertension   . Prostate cancer ([facility]) 2018   Rad seeds  .  Thyroid nodule     Patient Active Problem List   Diagnosis Date Noted  . Status post surgery 06/22/2020  . Thoracic aortic aneurysm without rupture ([facility]) 12/18/2019  . Respiratory failure ([facility]) 07/11/2017  . Acute respiratory failure ([facility]) 07/10/2017  . CAP (community acquired pneumonia) 07/10/2017  . Influenza A 07/10/2017  . COPD exacerbation ([place]) 07/10/2017  . HLD (hyperlipidemia) 03/09/2017  . Essential hypertension 08/14/2016  . 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 Bradycardia 08/05/2016    Past Surgical History:  Procedure Laterality Date  . APPENDECTOMY  1971  . PACEMAKER INSERTION N/A 08/06/2016   Procedure: INSERTION PACEMAKER;  Surgeon: Isaias Cowman, MD;  Location: [facility] ORS;  Service: Cardiovascular;  Laterality: N/A;       History reviewed. No pertinent family history.  Social History   Tobacco Use  . Smoking status: Current Every Day Smoker    Packs/day: 0.25    Years: 67.00    Pack years: 16.75    Types: Cigarettes  . Smokeless tobacco: Never Used  . Tobacco comment: 3 every other day  Vaping Use  . Vaping Use: Never used  Substance Use Topics  . Alcohol use: No  . Drug use: No    Home Medications Prior to Admission medications   Medication Sig Start Date End Date Taking? Authorizing Provider  amLODipine (NORVASC) 5 MG tablet Take 5 mg by mouth every morning. 09/20/19   [provider]  atorvastatin (LIPITOR) 40 MG tablet Take 1 tablet (40 mg total) by  mouth at bedtime. 06/17/18   Mayo, Pete Pelt, MD  budesonide-formoterol [facility]) 160-4.5 MCG/ACT inhaler Inhale 2 puffs into James lungs 2 (two) times daily. 02/13/18   Merlyn Lot, MD  Cholecalciferol (VITAMIN D3) 50 MCG (2000 UT) CHEW Chew 2,000 Units by mouth daily.    [provider]  donepezil (ARICEPT) 5 MG tablet Take 5 mg by mouth at bedtime.    [provider]  ipratropium-albuterol (DUONEB) 0.5-2.5 (3) MG/3ML SOLN SMARTSIG:3 Milliliter(s)  Via Nebulizer Every 6 Hours 01/21/20   [provider]  OXYGEN Inhale 2-3 L into James lungs as needed.    [provider]  pantoprazole (PROTONIX) 40 MG tablet Take 40 mg by mouth 2 (two) times daily. 10/18/19   [provider]  tamsulosin (FLOMAX) 0.4 MG CAPS capsule Take 0.4 mg by mouth daily. 10/14/19   [provider]  valsartan (DIOVAN) 160 MG tablet Take 160 mg by mouth daily. 05/04/18   [provider]    Allergies    Patient has no known allergies.  Review of Systems   Review of Systems  Constitutional: Negative.   HENT: Negative.   Respiratory: Negative.   Cardiovascular: Negative.   Gastrointestinal: Positive for abdominal pain. Negative for constipation, nausea and vomiting.  Genitourinary: Negative.   Musculoskeletal: Positive for back pain. Negative for myalgias and neck stiffness.  Skin: Negative.   Neurological: Negative.   Hematological: Negative.   Psychiatric/Behavioral: Negative.     Physical Exam Updated Vital Signs BP (!) 157/106 (BP Location: Right Arm)   Pulse 75   Temp 98 F (36.7 C) (Oral)   Resp 15   SpO2 96%   Physical Exam Vitals and nursing note reviewed.  HENT:     Head: Normocephalic and atraumatic.     Nose: Nose normal.     Mouth/Throat:     Mouth: Mucous membranes are moist.     Pharynx: Oropharynx is clear. Uvula midline. No oropharyngeal exudate or posterior oropharyngeal erythema.     Tonsils: No tonsillar exudate.  Eyes:     General: Lids are normal. Vision grossly intact.        Right eye: No discharge.        Left eye: No discharge.     Extraocular Movements: Extraocular movements intact.     Conjunctiva/sclera: Conjunctivae normal.     Pupils: Pupils are equal, round, and reactive to light.  Neck:     Trachea: Trachea and phonation normal.  Cardiovascular:     Rate and Rhythm: Normal rate and regular rhythm.     Pulses:          Radial pulses are 2+ on James right side and 2+ on James  left side.       Dorsalis pedis pulses are 1+ on James right side and 1+ on James left side.     Heart sounds: Normal heart sounds. No murmur heard.   Pulmonary:     Effort: Pulmonary effort is normal. No respiratory distress.     Breath sounds: Normal breath sounds. No wheezing or rales.  Chest:     Chest wall: No swelling, tenderness, crepitus or edema.  Abdominal:     General: Bowel sounds are normal. There is no distension.     Tenderness: There is generalized abdominal tenderness and tenderness in James right lower quadrant.  Musculoskeletal:        General: No deformity.     Cervical back: Full passive range of motion without pain, normal range  of motion and neck supple. No edema, rigidity or crepitus. No pain with movement or spinous process tenderness.       Back:     Right lower leg: No edema.     Left lower leg: No edema.  Lymphadenopathy:     Cervical: No cervical adenopathy.  Skin:    General: Skin is warm and dry.  Neurological:     General: No focal deficit present.     Mental Status: He is alert. Mental status is at baseline.     Cranial Nerves: Cranial nerves are intact.     Sensory: Sensation is intact.     Motor: Motor function is intact.  Psychiatric:        Mood and Affect: Mood normal.     ED Results / Procedures / Treatments   Labs (all labs ordered are listed, but only abnormal results are displayed) Labs Reviewed  TYPE AND SCREEN    EKG None  Radiology CT Renal Stone Study  Result Date: 06/22/2020 CLINICAL DATA:  Right lower quadrant pain. EXAM: CT ABDOMEN AND PELVIS WITHOUT CONTRAST TECHNIQUE: Multidetector CT imaging of James abdomen and pelvis was performed following James standard protocol without IV contrast. COMPARISON:  CT dated 01/01/2020. FINDINGS: Lower chest: James lung bases are clear. James heart size is normal. Hepatobiliary: James liver is normal. Normal gallbladder.There is no biliary ductal dilation. Pancreas: James previously demonstrated cystic  lesion of James pancreas is better visualized on prior study. Spleen: Unremarkable. Adrenals/Urinary Tract: --Adrenal glands: Unremarkable. --Right kidney/ureter: There is a punctate nonobstructing stone in James interpolar region of James right kidney. There is no right-sided hydronephrosis. --Left kidney/ureter: There is a cyst in James lower pole measuring approximately 4.9 cm. --Urinary bladder: Unremarkable. Stomach/Bowel: --Stomach/Duodenum: No hiatal hernia or other gastric abnormality. Normal duodenal course and caliber. --Small bowel: Unremarkable. --Colon: Unremarkable. --Appendix: Not visualized. No right lower quadrant inflammation or free fluid. Vascular/Lymphatic: Again noted is an infrarenal abdominal aortic aneurysm. This aneurysm measures approximately 5.9 cm (previously measuring up to approximately 5.4 cm. There are atherosclerotic changes of James abdominal aorta. There is aneurysmal dilatation of James bilateral common iliac arteries measuring up to approximately 2 cm each. --No retroperitoneal lymphadenopathy. --No mesenteric lymphadenopathy. --No pelvic or inguinal lymphadenopathy. Reproductive: Unremarkable Other: No ascites or free air. James abdominal wall is normal. Musculoskeletal. There is no acute compression fracture. Advanced degenerative changes are noted at James L4-L5 and L5-S1 levels. IMPRESSION: 1. No acute intra-abdominal abnormality detected. No findings to explain James patient's right lower quadrant abdominal pain. 2. Substantial interval increase in size of James previously demonstrated infrarenal abdominal aortic aneurysm, currently measuring up to approximately 5.9 cm in diameter (previously measuring approximately 5.4 cm). There is a new focal outpouching of James proximal aneurysm which appears to be new since prior study in 2021. Given James patient's symptoms, follow-up with an emergent CT angiogram of James abdomen and pelvis is recommended. Recommend referral to a vascular specialist. This  recommendation follows ACR consensus guidelines: White Paper of James ACR Incidental Findings Committee II on Vascular Findings. J Am Coll Radiol 2013; 10:789-794. 3. There is a punctate nonobstructing stone in James interpolar region of James right kidney. These results were called by telephone at James time of interpretation on 06/22/2020 at 4:58 pm to provider [place] [facility] , who verbally acknowledged these results. Electronically Signed   By: Constance Holster M.D.   On: 06/22/2020 17:01   HYBRID OR IMAGING ([facility] ONLY)  Result Date: 06/22/2020 There is no  interpretation for this exam.  This order is for images obtained during a surgical procedure.  Please See "Surgeries" Tab for more information regarding James procedure.   CT Angio Abd/Pel W and/or Wo Contrast  Result Date: 06/22/2020 CLINICAL DATA:  Abdominal aortic aneurysm increased on earlier noncontrast CT EXAM: CTA ABDOMEN AND PELVIS WITHOUT AND WITH CONTRAST TECHNIQUE: Multidetector CT imaging of James abdomen and pelvis was performed using James standard protocol during bolus administration of intravenous contrast. Multiplanar reconstructed images and MIPs were obtained and reviewed to evaluate James vascular anatomy. CONTRAST:  64mL OMNIPAQUE IOHEXOL 350 MG/ML SOLN IV COMPARISON:  Earlier contrast noncontrast study of 06/22/2020, prior CT angio abdomen and pelvis 01/01/2020 FINDINGS: VASCULAR Aorta: Scattered atherosclerotic calcifications. Aneurysmal dilatation of James infrarenal abdominal aorta to a greatest size of 5.9 x 5.5 cm, aneurysmal segment stenting approximately 7.5 cm length. Aorta measures 3.0 cm diameter at James aortic bifurcation. James high attenuation crescent seen within James aorta on James previous exam is less well demonstrated postcontrast but may represent intramural hematoma. Significant thrombus within aneurysm. No definite evidence of perianeurysmal hemorrhage/infiltration to suggest aneurysm rupture, though portions of James RIGHT lateral  aspect of James aneurysm are not well defined due to presence of adjacent duodenum, IVC, and pancreas. Distal thoracic aorta measures 3.4 cm transverse image 1. Celiac: Minimal plaque at origin, widely patent SMA: Widely patent Renals: Minimal plaque at origin of LEFT renal artery. Both renal arteries appear widely patent IMA: Occluded at origin Inflow: Aneurysmal dilatation of common iliac arteries 17 mm diameter LEFT and 18 mm diameter RIGHT BILATERAL tortuosity of common iliac arteries. 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 Proximal Outflow: Atherosclerotic calcifications at James common femoral arteries bilaterally extending into James bifurcations. Lesser degree of enhancement of James RIGHT common femoral artery and RIGHT superficial femoral/profundus femoral arteries versus LEFT. Veins: Unopacified at time of CTA imaging Review of James MIP images confirms James above findings. NON-VASCULAR Lower chest: COPD changes with bibasilar atelectasis. Nodular area of atelectasis at base of RIGHT lower lobe, 11 x 7 mm, minimally more prominent than on prior study from 01/01/2020. Hepatobiliary: Gallbladder and liver unremarkable Pancreas: Atrophic without definite mass Spleen: Normal appearance Adrenals/Urinary Tract: Thickening of adrenal glands without mass. Normal appearing RIGHT kidney. Large cyst LEFT kidney 5.1 x 4.1 cm image 54. Ureters poorly visualized. Bladder unremarkable. Stomach/Bowel: Bowel loops unremarkable Lymphatic: No definite adenopathy Reproductive: Minimal prostatic enlargement Other: No free air or free fluid.  No hernia. Musculoskeletal: Osseous demineralization with degenerative disc disease changes especially at L4-L5 and L5-S1. IMPRESSION: Aneurysmal dilatation of James infrarenal abdominal aorta to a greatest size of 5.9 x 5.5 cm, increased in size since James previous exam, with significant thrombus within as well as a high attenuation  crescent within James aneurysm on James earlier noncontrast exam suggesting acute intramural hematoma. No definite evidence of aneurysm rupture/leak as above. 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 COPD changes with bibasilar atelectasis and minimal increase in size of a nodular area of atelectasis at base of RIGHT lower lobe. Large LEFT renal cyst. Aortic Atherosclerosis (ICD10-I70.0). Aortic aneurysm NOS (ICD10-I71.9). Emphysema (ICD10-J43.9). Critical Value/emergent results were called by telephone at James time of interpretation on 06/22/2020 at 7:15 pm to provider [facility] [place] , who verbally acknowledged these results. Electronically Signed   By: Lavonia Dana M.D.   On: 06/22/2020 19:12  Procedures .Critical Care Performed by: Emeline Darling, PA-C Authorized by: Emeline Darling, PA-C   Critical care provider statement:    Critical care time (minutes):  45   Critical care was necessary to treat or prevent imminent or life-threatening deterioration of James following conditions: Enlarging AAA.   Critical care was time spent personally by me on James following activities:  Discussions with consultants, evaluation of patient's response to treatment, examination of patient, ordering and performing treatments and interventions, ordering and review of laboratory studies, ordering and review of radiographic studies, pulse oximetry, re-evaluation of patient's condition, obtaining history from patient or surrogate and review of old charts   Care discussed with: admitting provider     Care discussed with comment:  Vascular surgeon     Medications Ordered in ED Medications - No data to display  ED Course  I have reviewed James triage vital signs and James nursing notes.  Pertinent labs & imaging results that were available during my care of James patient were reviewed by me  and considered in my medical decision making (see chart for details).    MDM Rules/Calculators/A&P                         79 year old male who presents via EMS for ED to ED transfer for enlarging AAA. Vascular surgery aware, receiving surgeon Dr. Stanford Breed.   Patient was initially evaluated by this provider at time of EMS arrival. Patient is stable and in no acute distress, he remains hypertensive to 157/106. He is not tachycardic or hypoxic. Cardiopulmonary exam without acute finding, palpable thrill from an enlarged aorta on palpation of James abdomen. Generalized abdominal tenderness to palpation, worse in James right lower quadrant. Patient underwent basic laboratory studies at [facility], will add on type and screen.  STAT page to vascular surgery, Dr. Stanford Breed, who is walking to James emergency department to see James patient at time of my page.  I personally spoke with Dr. Stanford Breed after his evaluation of James patient, he is planning to take patient to James OR this evening for repair. I appreciate his collaboration in James care of this patient.   This chart was dictated using voice recognition software, Dragon. Despite James best efforts of this provider to proofread and correct errors, errors may still occur which can change documentation meaning.  Final Clinical Impression(s) / ED Diagnoses Final diagnoses:  None    Rx / DC Orders ED Discharge Orders    None       Aura Dials 06/22/20 2252    Noemi Chapel, MD 06/23/20 [phone]

## 2020-06-22 NOTE — Plan of Care (Signed)
Called by Dr. 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 He has been seen in the past by Dr. Servando Snare and Dr. Trula Slade. He presented to the ER with RLQ abdominal pain for a day. CTA demonstrates interval increase in size of the infra-renal aneurysm. Recommend transfer to [facility] ER for evaluation.   If he has a symptomatic aneurysm will plan to proceed to OR for bilateral open femoral artery exposure. I would try to recanalize the chronically occluded right external iliac and proceed with EVAR. If unable to recanalize the REIA, would need L-->R femoral-femoral bypass and aorto-uni-iliac stenting with embolization of the right common iliac artery.  Please advise when patient presents to [facility] ER.

## 2020-06-22 NOTE — H&P (Addendum)
ASSESSMENT & PLAN:  80 y.o. male with symptomatic infrarenal abdominal aortic aneurysm. His history, physical examination, and CT findings are concerning for imminent rupture. I have counseled the patient that not intervening could expose him to a very high risk of rupture and death. Counseled him of the risks of the procedure including MI, stroke, access site complications, wound infection, graft infection. He is understanding and wishes to proceed. Plan EVAR with recanalization of R external iliac artery occlusion in OR 16 tonight. If unable to recanalize will need L-->R femoral-femoral bypass.  CHIEF COMPLAINT:   Back and abdominal pain  HISTORY:  HISTORY OF PRESENT ILLNESS: James Dickerson is a 80 y.o. 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  He was previously prophylactic offered repair of the thoracic aneurysm with planned branching of the brachiocephalic vessels and TEVAR.  He wanted some time to consider this and was due to follow-up within the next month.  He presents to [facility] ER earlier today reporting a day of right lower quadrant/iliac crest pain and mid lumbar pain.  He reports he has had nausea with poor appetite over the past several months.  He reports an unintentional weight loss, but his weight was 54 kg in September and is 58 kg today.  Past Medical History:  Diagnosis Date  . Back pain   . Cardiomegaly   . COPD (chronic obstructive pulmonary disease) (HCC)   . Cystic kidney disease   . Depressive disorder   . GERD (gastroesophageal reflux disease)   . High cholesterol   . History of prostate cancer   . Hypertension   . Prostate cancer (HCC) 2018   Rad seeds  . Thyroid nodule     Past Surgical History:  Procedure Laterality Date  . APPENDECTOMY  1971  . PACEMAKER INSERTION N/A 08/06/2016   Procedure: INSERTION PACEMAKER;  Surgeon: Marcina Millard, MD;  Location: [facility] ORS;  Service:  Cardiovascular;  Laterality: N/A;    History reviewed. No pertinent family history.  Social History   Socioeconomic History  . Marital status: Single    Spouse name: Not on file  . Number of children: Not on file  . Years of education: Not on file  . Highest education level: Not on file  Occupational History  . Not on file  Tobacco Use  . Smoking status: Current Every Day Smoker    Packs/day: 0.25    Years: 67.00    Pack years: 16.75    Types: Cigarettes  . Smokeless tobacco: Never Used  . Tobacco comment: 3 every other day  Vaping Use  . Vaping Use: Never used  Substance and Sexual Activity  . Alcohol use: No  . Drug use: No  . Sexual activity: Not on file  Other Topics Concern  . Not on file  Social History Narrative  . Not on file   Social Determinants of Health   Financial Resource Strain: Not on file  Food Insecurity: Not on file  Transportation Needs: Not on file  Physical Activity: Not on file  Stress: Not on file  Social Connections: Not on file  Intimate Partner Violence: Not on file    No Known Allergies  No current facility-administered medications for this encounter.   Current Outpatient Medications  Medication Sig Dispense Refill  . amLODipine (NORVASC) 5 MG tablet Take 5 mg by mouth every morning.    Marland Kitchen atorvastatin (LIPITOR) 40 MG tablet Take 1 tablet (40 mg  total) by mouth at bedtime. 30 tablet 0  . budesonide-formoterol (SYMBICORT) 160-4.5 MCG/ACT inhaler Inhale 2 puffs into the lungs 2 (two) times daily. 1 Inhaler 2  . Cholecalciferol (VITAMIN D3) 50 MCG (2000 UT) CHEW Chew 2,000 Units by mouth daily.    Marland Kitchen donepezil (ARICEPT) 5 MG tablet Take 5 mg by mouth at bedtime.    Marland Kitchen ipratropium-albuterol (DUONEB) 0.5-2.5 (3) MG/3ML SOLN SMARTSIG:3 Milliliter(s) Via Nebulizer Every 6 Hours    . OXYGEN Inhale 2-3 L into the lungs as needed.    . pantoprazole (PROTONIX) 40 MG tablet Take 40 mg by mouth 2 (two) times daily.    . tamsulosin (FLOMAX) 0.4 MG  CAPS capsule Take 0.4 mg by mouth daily.    . valsartan (DIOVAN) 160 MG tablet Take 160 mg by mouth daily.      REVIEW OF SYSTEMS:  [X]  denotes positive finding, [ ]  denotes negative finding Cardiac  Comments:  Chest pain or chest pressure:    Shortness of breath upon exertion:    Short of breath when lying flat:    Irregular heart rhythm:        Vascular    Pain in calf, thigh, or hip brought on by ambulation:    Pain in feet at night that wakes you up from your sleep:     Blood clot in your veins:    Leg swelling:         Pulmonary    Oxygen at home:    Productive cough:     Wheezing:         Neurologic    Sudden weakness in arms or legs:     Sudden numbness in arms or legs:     Sudden onset of difficulty speaking or slurred speech:    Temporary loss of vision in one eye:     Problems with dizziness:         Gastrointestinal    Blood in stool:     Vomited blood:         Genitourinary    Burning when urinating:     Blood in urine:        Psychiatric    Major depression:         Hematologic    Bleeding problems:    Problems with blood clotting too easily:        Skin    Rashes or ulcers:        Constitutional    Fever or chills:     PHYSICAL EXAM:   Vitals:   06/22/20 2150  BP: (!) 157/106  Pulse: 75  Resp: 15  Temp: 98 F (36.7 C)  TempSrc: Oral  SpO2: 96%   Constitutional: Thin, elderly man in no distress. Appears under nourished.  Neurologic: CN intact.  No focal findings.  No sensory loss. Psychiatric: Mood and affect symmetric and appropriate. Eyes: No icterus. No conjunctival pallor. Ears, nose, throat: mucous membranes moist. Midline trachea.  Cardiac: Regular rate and rhythm.  Respiratory: unlabored. Abdominal: soft, non-tender, non-distended.  Peripheral vascular:  2+ left femoral pulse  Absent right femoral pulse  Palpable pulsatile mass mid abdomen which is tender to palpation Extremity: No edema. No cyanosis. No pallor.  Skin: No  gangrene. No ulceration.  Lymphatic: No Stemmer's sign. No palpable lymphadenopathy.   DATA REVIEW:    Most recent CBC CBC Latest Ref Rng & Units 06/22/2020 06/15/2018 02/13/2018  WBC 4.0 - 10.5 K/uL 4.5 7.8 3.3(L)  Hemoglobin 13.0 -  17.0 g/dL 13.4 14.2 13.4  Hematocrit 39.0 - 52.0 % 40.6 44.9 40.9  Platelets 150 - 400 K/uL 192 175 148(L)     Most recent CMP CMP Latest Ref Rng & Units 06/22/2020 12/01/2019 06/15/2018  Glucose 70 - 99 mg/dL 95 - 131(H)  BUN 8 - 23 mg/dL 15 - 21  Creatinine 0.61 - 1.24 mg/dL 1.02 1.10 1.24  Sodium 135 - 145 mmol/L 131(L) - 140  Potassium 3.5 - 5.1 mmol/L 4.1 - 4.1  Chloride 98 - 111 mmol/L 93(L) - 105  CO2 22 - 32 mmol/L 27 - 28  Calcium 8.9 - 10.3 mg/dL 9.4 - 9.1  Total Protein 6.5 - 8.1 g/dL 6.9 - 7.2  Total Bilirubin 0.3 - 1.2 mg/dL 1.5(H) - 1.4(H)  Alkaline Phos 38 - 126 U/L 63 - 76  AST 15 - 41 U/L 20 - 16  ALT 0 - 44 U/L 13 - 12    Renal function Estimated Creatinine Clearance: 46.5 mL/min (by C-G formula based on SCr of 1.02 mg/dL).  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 Stanford Breed, MD [facility] Phone Number: [phone] 06/22/2020 10:13 PM

## 2020-06-22 NOTE — ED Notes (Signed)
Patient transported to CT 

## 2020-06-22 NOTE — ED Provider Notes (Signed)
Medical screening examination/treatment/procedure(s) were conducted as a shared visit with non-physician practitioner(s) and myself.  I personally evaluated the patient during the encounter.  Clinical Impression:   Final diagnoses:  None   This patient is a 80 year old male presenting with a very large aneurysm.  He has abdominal discomfort, on exam he has a pulsating mass that is approximately 6 cm in diameter.  He does not have any significant tenderness in the abdomen, he appears in no distress, he is hypertensive at 157/106.  Vascular surgery has seen this patient is going to take him to the operating room.  Pt has critical illness - exam not c/w perforation but due to large AAA, needs rapid surgical evaluatoin and possible surgical stabilization to prevent rupture    Noemi Chapel, MD 06/23/20 [phone]

## 2020-06-22 NOTE — ED Triage Notes (Addendum)
Assume care from [place] paramedics. EMS states pt was seen at [facility] facility where pt was complaining of abd pain since yesterday . 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 EMS reports hx of recent pacemaker. EMS administer 10mg  of Labetalol in route   146/98 HR 80 RR 18 O2 99 RA

## 2020-06-23 ENCOUNTER — Inpatient Hospital Stay (HOSPITAL_COMMUNITY): Payer: Medicare Other

## 2020-06-23 DIAGNOSIS — I714 Abdominal aortic aneurysm, without rupture, unspecified: Secondary | ICD-10-CM | POA: Diagnosis present

## 2020-06-23 LAB — APTT: aPTT: 38 seconds — ABNORMAL HIGH (ref 24–36)

## 2020-06-23 LAB — BASIC METABOLIC PANEL
Anion gap: 12 (ref 5–15)
BUN: 11 mg/dL (ref 8–23)
CO2: 22 mmol/L (ref 22–32)
Calcium: 8.5 mg/dL — ABNORMAL LOW (ref 8.9–10.3)
Chloride: 99 mmol/L (ref 98–111)
Creatinine, Ser: 1.01 mg/dL (ref 0.61–1.24)
GFR, Estimated: >60 mL/min (ref >60)
Glucose, Bld: 117 mg/dL — ABNORMAL HIGH (ref 70–99)
Potassium: 3.9 mmol/L (ref 3.5–5.1)
Sodium: 133 mmol/L — ABNORMAL LOW (ref 135–145)

## 2020-06-23 LAB — POCT I-STAT 7, (LYTES, BLD GAS, ICA,H+H)
Acid-Base Excess: 2 mmol/L (ref 0.0–2.0)
Bicarbonate: 28.5 mmol/L — ABNORMAL HIGH (ref 20.0–28.0)
Calcium, Ion: 1.26 mmol/L (ref 1.15–1.40)
HCT: 38 % — ABNORMAL LOW (ref 39.0–52.0)
Hemoglobin: 12.9 g/dL — ABNORMAL LOW (ref 13.0–17.0)
O2 Saturation: 100 %
Potassium: 4 mmol/L (ref 3.5–5.1)
Sodium: 134 mmol/L — ABNORMAL LOW (ref 135–145)
TCO2: 30 mmol/L (ref 22–32)
pCO2 arterial: 52.6 mmHg — ABNORMAL HIGH (ref 32.0–48.0)
pH, Arterial: 7.341 — ABNORMAL LOW (ref 7.350–7.450)
pO2, Arterial: 504 mmHg — ABNORMAL HIGH (ref 83.0–108.0)

## 2020-06-23 LAB — PROTIME-INR
INR: 1.2 (ref 0.8–1.2)
Prothrombin Time: 15.2 seconds (ref 11.4–15.2)

## 2020-06-23 LAB — CBC
HCT: 34.3 % — ABNORMAL LOW (ref 39.0–52.0)
Hemoglobin: 11.7 g/dL — ABNORMAL LOW (ref 13.0–17.0)
MCH: 31.7 pg (ref 26.0–34.0)
MCHC: 34.1 g/dL (ref 30.0–36.0)
MCV: 93 fL (ref 80.0–100.0)
Platelets: 153 10*3/uL (ref 150–400)
RBC: 3.69 MIL/uL — ABNORMAL LOW (ref 4.22–5.81)
RDW: 12.9 % (ref 11.5–15.5)
WBC: 6.5 10*3/uL (ref 4.0–10.5)
nRBC: 0 % (ref 0.0–0.2)

## 2020-06-23 LAB — PSA: Prostatic Specific Antigen: 7.05 ng/mL — ABNORMAL HIGH (ref 0.00–4.00)

## 2020-06-23 LAB — MAGNESIUM: Magnesium: 1.7 mg/dL (ref 1.7–2.4)

## 2020-06-23 LAB — ABO/RH: ABO/RH(D): A POS

## 2020-06-23 MED ORDER — EPHEDRINE SULFATE 50 MG/ML IJ SOLN
INTRAMUSCULAR | Status: DC | PRN
  Administered 2020-06-23: 5 mg via INTRAVENOUS
  Administered 2020-06-23: 10 mg via INTRAVENOUS

## 2020-06-23 MED ORDER — GUAIFENESIN-DM 100-10 MG/5ML PO SYRP: 15.0000 mL | ORAL_SOLUTION | ORAL | Status: DC | PRN

## 2020-06-23 MED ORDER — LABETALOL HCL 5 MG/ML IV SOLN
5.0000 mg | INTRAVENOUS | Status: DC | PRN
Start: 2020-06-23 — End: 2020-06-23

## 2020-06-23 MED ORDER — IPRATROPIUM-ALBUTEROL 0.5-2.5 (3) MG/3ML IN SOLN: 3.0000 mL | Freq: Four times a day (QID) | RESPIRATORY_TRACT | Status: DC | PRN

## 2020-06-23 MED ORDER — METOPROLOL TARTRATE 5 MG/5ML IV SOLN: 2.0000 mg | INTRAVENOUS | Status: DC | PRN

## 2020-06-23 MED ORDER — ACETAMINOPHEN 325 MG PO TABS
650.0000 mg | ORAL_TABLET | Freq: Four times a day (QID) | ORAL | Status: DC
  Administered 2020-06-23 – 2020-06-29 (×12): 650 mg via ORAL
  Filled 2020-06-23 (×15): qty 2

## 2020-06-23 MED ORDER — HEPARIN SODIUM (PORCINE) 1000 UNIT/ML IJ SOLN
INTRAMUSCULAR | Status: DC | PRN
  Administered 2020-06-23: 1000 [IU] via INTRAVENOUS
  Administered 2020-06-23: 5000 [IU] via INTRAVENOUS

## 2020-06-23 MED ORDER — PANTOPRAZOLE SODIUM 40 MG PO TBEC
40.0000 mg | DELAYED_RELEASE_TABLET | Freq: Two times a day (BID) | ORAL | Status: DC
  Administered 2020-06-23 – 2020-06-29 (×13): 40 mg via ORAL
  Filled 2020-06-23 (×13): qty 1

## 2020-06-23 MED ORDER — CHLORHEXIDINE GLUCONATE CLOTH 2 % EX PADS
6.0000 | MEDICATED_PAD | Freq: Every day | CUTANEOUS | Status: DC
  Administered 2020-06-24 – 2020-06-28 (×5): 6 via TOPICAL

## 2020-06-23 MED ORDER — HYDRALAZINE HCL 20 MG/ML IJ SOLN
5.0000 mg | INTRAMUSCULAR | Status: DC | PRN
  Administered 2020-06-23: 5 mg via INTRAVENOUS
  Filled 2020-06-23: qty 1

## 2020-06-23 MED ORDER — DEXAMETHASONE SODIUM PHOSPHATE 10 MG/ML IJ SOLN
INTRAMUSCULAR | Status: DC | PRN
  Administered 2020-06-23: 10 mg via INTRAVENOUS

## 2020-06-23 MED ORDER — OXYCODONE HCL 5 MG PO TABS
5.0000 mg | ORAL_TABLET | ORAL | Status: DC | PRN
  Administered 2020-06-23 – 2020-06-24 (×2): 5 mg via ORAL
  Filled 2020-06-23 (×2): qty 1

## 2020-06-23 MED ORDER — 0.9 % SODIUM CHLORIDE (POUR BTL) OPTIME
TOPICAL | Status: DC | PRN
  Administered 2020-06-23: 1000 mL

## 2020-06-23 MED ORDER — SODIUM CHLORIDE 0.9 % IV SOLN: INTRAVENOUS | Status: DC | PRN

## 2020-06-23 MED ORDER — LACTATED RINGERS IV SOLN: INTRAVENOUS | Status: DC | PRN

## 2020-06-23 MED ORDER — ONDANSETRON HCL 4 MG/2ML IJ SOLN
INTRAMUSCULAR | Status: AC
  Filled 2020-06-23: qty 2

## 2020-06-23 MED ORDER — LACTATED RINGERS IV SOLN: INTRAVENOUS | Status: DC

## 2020-06-23 MED ORDER — ONDANSETRON HCL 4 MG/2ML IJ SOLN: 4.0000 mg | Freq: Once | INTRAMUSCULAR | Status: DC | PRN

## 2020-06-23 MED ORDER — HEMOSTATIC AGENTS (NO CHARGE) OPTIME
TOPICAL | Status: DC | PRN
  Administered 2020-06-23: 1 via TOPICAL

## 2020-06-23 MED ORDER — AMLODIPINE BESYLATE 5 MG PO TABS
5.0000 mg | ORAL_TABLET | Freq: Every morning | ORAL | Status: DC
  Administered 2020-06-23 – 2020-06-29 (×7): 5 mg via ORAL
  Filled 2020-06-23 (×7): qty 1

## 2020-06-23 MED ORDER — CEFAZOLIN SODIUM-DEXTROSE 2-3 GM-%(50ML) IV SOLR
INTRAVENOUS | Status: DC | PRN
  Administered 2020-06-22: 2 g via INTRAVENOUS

## 2020-06-23 MED ORDER — SODIUM CHLORIDE 0.9 % IV SOLN
INTRAVENOUS | Status: DC | PRN
  Administered 2020-06-23: 500 mL

## 2020-06-23 MED ORDER — BISACODYL 10 MG RE SUPP: 10.0000 mg | Freq: Every day | RECTAL | Status: DC | PRN

## 2020-06-23 MED ORDER — PROTAMINE SULFATE 10 MG/ML IV SOLN
INTRAVENOUS | Status: AC
  Filled 2020-06-23: qty 5

## 2020-06-23 MED ORDER — PROTAMINE SULFATE 10 MG/ML IV SOLN
INTRAVENOUS | Status: DC | PRN
  Administered 2020-06-23: 50 mg via INTRAVENOUS

## 2020-06-23 MED ORDER — CEFAZOLIN SODIUM-DEXTROSE 2-4 GM/100ML-% IV SOLN
2.0000 g | Freq: Three times a day (TID) | INTRAVENOUS | Status: AC
  Administered 2020-06-23 (×2): 2 g via INTRAVENOUS
  Filled 2020-06-23 (×2): qty 100

## 2020-06-23 MED ORDER — DEXAMETHASONE SODIUM PHOSPHATE 10 MG/ML IJ SOLN
INTRAMUSCULAR | Status: AC
  Filled 2020-06-23: qty 1

## 2020-06-23 MED ORDER — ONDANSETRON HCL 4 MG/2ML IJ SOLN
INTRAMUSCULAR | Status: DC | PRN
  Administered 2020-06-23: 4 mg via INTRAVENOUS

## 2020-06-23 MED ORDER — ACETAMINOPHEN 10 MG/ML IV SOLN: 1000.0000 mg | Freq: Once | INTRAVENOUS | Status: DC | PRN

## 2020-06-23 MED ORDER — IODIXANOL 320 MG/ML IV SOLN
INTRAVENOUS | Status: DC | PRN
  Administered 2020-06-23: 120 mL
  Administered 2020-06-23: 50 mL

## 2020-06-23 MED ORDER — POLYETHYLENE GLYCOL 3350 17 G PO PACK: 17.0000 g | PACK | Freq: Every day | ORAL | Status: DC | PRN

## 2020-06-23 MED ORDER — SODIUM CHLORIDE 0.9 % IV SOLN: INTRAVENOUS | Status: DC

## 2020-06-23 MED ORDER — HEPARIN SODIUM (PORCINE) 5000 UNIT/ML IJ SOLN
5000.0000 [IU] | Freq: Three times a day (TID) | INTRAMUSCULAR | Status: DC
  Administered 2020-06-24 – 2020-06-29 (×16): 5000 [IU] via SUBCUTANEOUS
  Filled 2020-06-23 (×16): qty 1

## 2020-06-23 MED ORDER — HEPARIN SODIUM (PORCINE) 1000 UNIT/ML IJ SOLN
INTRAMUSCULAR | Status: AC
  Filled 2020-06-23: qty 1

## 2020-06-23 MED ORDER — ACETAMINOPHEN 325 MG PO TABS
325.0000 mg | ORAL_TABLET | ORAL | Status: DC | PRN
Start: 2020-06-23 — End: 2020-06-23

## 2020-06-23 MED ORDER — DONEPEZIL HCL 10 MG PO TABS
10.0000 mg | ORAL_TABLET | Freq: Every day | ORAL | Status: DC
  Administered 2020-06-23 – 2020-06-28 (×6): 10 mg via ORAL
  Filled 2020-06-23: qty 1
  Filled 2020-06-23: qty 2
  Filled 2020-06-23 (×2): qty 1
  Filled 2020-06-23: qty 2
  Filled 2020-06-23: qty 1
  Filled 2020-06-23 (×2): qty 2
  Filled 2020-06-23 (×2): qty 1
  Filled 2020-06-23 (×2): qty 2
  Filled 2020-06-23: qty 1

## 2020-06-23 MED ORDER — SUGAMMADEX SODIUM 200 MG/2ML IV SOLN
INTRAVENOUS | Status: DC | PRN
  Administered 2020-06-23: 125 mg via INTRAVENOUS

## 2020-06-23 MED ORDER — LABETALOL HCL 5 MG/ML IV SOLN
INTRAVENOUS | Status: DC | PRN
  Administered 2020-06-23 (×2): 5 mg via INTRAVENOUS

## 2020-06-23 MED ORDER — ALBUMIN HUMAN 5 % IV SOLN: INTRAVENOUS | Status: DC | PRN

## 2020-06-23 MED ORDER — MOMETASONE FURO-FORMOTEROL FUM 200-5 MCG/ACT IN AERO
2.0000 | INHALATION_SPRAY | Freq: Two times a day (BID) | RESPIRATORY_TRACT | Status: DC
  Administered 2020-06-23 – 2020-06-29 (×11): 2 via RESPIRATORY_TRACT
  Filled 2020-06-23: qty 8.8

## 2020-06-23 MED ORDER — ONDANSETRON HCL 4 MG/2ML IJ SOLN: 4.0000 mg | Freq: Four times a day (QID) | INTRAMUSCULAR | Status: DC | PRN

## 2020-06-23 MED ORDER — TAMSULOSIN HCL 0.4 MG PO CAPS
0.4000 mg | ORAL_CAPSULE | Freq: Every day | ORAL | Status: DC
  Administered 2020-06-23 – 2020-06-29 (×7): 0.4 mg via ORAL
  Filled 2020-06-23 (×7): qty 1

## 2020-06-23 MED ORDER — SUCCINYLCHOLINE CHLORIDE 200 MG/10ML IV SOSY
PREFILLED_SYRINGE | INTRAVENOUS | Status: AC
  Filled 2020-06-23: qty 10

## 2020-06-23 MED ORDER — ATORVASTATIN CALCIUM 40 MG PO TABS
40.0000 mg | ORAL_TABLET | Freq: Every day | ORAL | Status: DC
  Administered 2020-06-23 – 2020-06-28 (×6): 40 mg via ORAL
  Filled 2020-06-23 (×6): qty 1

## 2020-06-23 MED ORDER — ORAL CARE MOUTH RINSE
15.0000 mL | Freq: Two times a day (BID) | OROMUCOSAL | Status: DC
  Administered 2020-06-23 – 2020-06-26 (×5): 15 mL via OROMUCOSAL

## 2020-06-23 MED ORDER — MIDAZOLAM HCL 2 MG/2ML IJ SOLN
INTRAMUSCULAR | Status: DC | PRN
  Administered 2020-06-22: 1 mg via INTRAVENOUS

## 2020-06-23 MED ORDER — LABETALOL HCL 5 MG/ML IV SOLN
INTRAVENOUS | Status: AC
  Administered 2020-06-23: 10 mg via INTRAVENOUS
  Filled 2020-06-23: qty 4

## 2020-06-23 MED ORDER — MAGNESIUM SULFATE 2 GM/50ML IV SOLN: 2.0000 g | Freq: Every day | INTRAVENOUS | Status: DC | PRN

## 2020-06-23 MED ORDER — PHENOL 1.4 % MT LIQD: 1.0000 | OROMUCOSAL | Status: DC | PRN

## 2020-06-23 MED ORDER — POTASSIUM CHLORIDE CRYS ER 20 MEQ PO TBCR: 20.0000 meq | EXTENDED_RELEASE_TABLET | Freq: Every day | ORAL | Status: DC | PRN

## 2020-06-23 MED ORDER — OXYCODONE-ACETAMINOPHEN 5-325 MG PO TABS: 1.0000 | ORAL_TABLET | ORAL | Status: DC | PRN

## 2020-06-23 MED ORDER — ASPIRIN EC 81 MG PO TBEC
81.0000 mg | DELAYED_RELEASE_TABLET | Freq: Every day | ORAL | Status: DC
  Administered 2020-06-24 – 2020-06-29 (×6): 81 mg via ORAL
  Filled 2020-06-23 (×6): qty 1

## 2020-06-23 MED ORDER — MORPHINE SULFATE (PF) 4 MG/ML IV SOLN: 4.0000 mg | INTRAVENOUS | Status: DC | PRN

## 2020-06-23 MED ORDER — ROCURONIUM BROMIDE 10 MG/ML (PF) SYRINGE
PREFILLED_SYRINGE | INTRAVENOUS | Status: AC
  Filled 2020-06-23: qty 10

## 2020-06-23 MED ORDER — FENTANYL CITRATE (PF) 100 MCG/2ML IJ SOLN: 25.0000 ug | INTRAMUSCULAR | Status: DC | PRN

## 2020-06-23 MED ORDER — LABETALOL HCL 5 MG/ML IV SOLN
10.0000 mg | INTRAVENOUS | Status: AC | PRN
  Administered 2020-06-23 (×3): 10 mg via INTRAVENOUS
  Filled 2020-06-23: qty 4

## 2020-06-23 MED ORDER — DOCUSATE SODIUM 100 MG PO CAPS
100.0000 mg | ORAL_CAPSULE | Freq: Every day | ORAL | Status: DC
  Administered 2020-06-24 – 2020-06-28 (×5): 100 mg via ORAL
  Filled 2020-06-23 (×6): qty 1

## 2020-06-23 MED ORDER — PHENYLEPHRINE HCL (PRESSORS) 10 MG/ML IV SOLN
INTRAVENOUS | Status: AC
  Filled 2020-06-23: qty 1

## 2020-06-23 MED ORDER — MORPHINE SULFATE (PF) 2 MG/ML IV SOLN: 2.0000 mg | INTRAVENOUS | Status: DC | PRN

## 2020-06-23 MED ORDER — ACETAMINOPHEN 325 MG RE SUPP
325.0000 mg | RECTAL | Status: DC | PRN
Start: 2020-06-23 — End: 2020-06-23

## 2020-06-23 MED ORDER — IPRATROPIUM-ALBUTEROL 0.5-2.5 (3) MG/3ML IN SOLN
3.0000 mL | Freq: Four times a day (QID) | RESPIRATORY_TRACT | Status: DC
  Administered 2020-06-23 (×2): 3 mL via RESPIRATORY_TRACT
  Filled 2020-06-23 (×2): qty 3

## 2020-06-23 MED ORDER — ALUM & MAG HYDROXIDE-SIMETH 200-200-20 MG/5ML PO SUSP: 15.0000 mL | ORAL | Status: DC | PRN

## 2020-06-23 MED ORDER — LABETALOL HCL 5 MG/ML IV SOLN
INTRAVENOUS | Status: AC
  Filled 2020-06-23: qty 4

## 2020-06-23 MED ORDER — VITAMIN D 25 MCG (1000 UNIT) PO TABS
2000.0000 [IU] | ORAL_TABLET | Freq: Every day | ORAL | Status: DC
  Administered 2020-06-23 – 2020-06-29 (×7): 2000 [IU] via ORAL
  Filled 2020-06-23 (×8): qty 2

## 2020-06-23 MED ORDER — SODIUM CHLORIDE 0.9 % IV SOLN: 500.0000 mL | Freq: Once | INTRAVENOUS | Status: DC | PRN

## 2020-06-23 MED ORDER — VASOPRESSIN 20 UNIT/ML IV SOLN
INTRAVENOUS | Status: DC | PRN
  Administered 2020-06-22: 1 [IU] via INTRAVENOUS

## 2020-06-23 NOTE — Progress Notes (Signed)
   ASSESSMENT & PLAN:  James Dickerson is a 80 y.o. male s/p EVAR / REIA stenting for symptomatic aortic aneurysm. Complicated by urethral injury during preoperative foley placement.  PRN pain control. PT / OT / OOB. Diet as tolerated. Critical foley / SP tube - do not manipulate. OK for transfer to 4E.  SUBJECTIVE:  Abdominal pain resolved. No other complaints. Explained conduct of operation and urethral injury to patient. He is understanding.  OBJECTIVE:  BP 138/73   Pulse 82   Temp 99.3 F (37.4 C)   Resp 12   Ht 5' 2.99" (1.6 m)   Wt 50 kg Comment: via bed  SpO2 97%   BMI 19.53 kg/m   Intake/Output Summary (Last 24 hours) at 06/23/2020 1100 Last data filed at 06/23/2020 0800 Gross per 24 hour  Intake 3095.94 ml  Output 1475 ml  Net 1620.94 ml    Constitutional: well appearing. no acute distress. Cardiac: RRR. Vascular: 2+ Dps. 2+ femoral pulses. Clean, dry incisions. Pulmonary: unlabored Abdominal: soft, pulsatile mass improved. Aorta still palpable.  CBC Latest Ref Rng & Units 06/23/2020 06/22/2020 06/22/2020  WBC 4.0 - 10.5 K/uL 6.5 - 4.5  Hemoglobin 13.0 - 17.0 g/dL 11.7(L) 12.9(L) 13.4  Hematocrit 39.0 - 52.0 % 34.3(L) 38.0(L) 40.6  Platelets 150 - 400 K/uL 153 - 192     CMP Latest Ref Rng & Units 06/23/2020 06/22/2020 06/22/2020  Glucose 70 - 99 mg/dL 117(H) - 95  BUN 8 - 23 mg/dL 11 - 15  Creatinine 0.61 - 1.24 mg/dL 1.01 - 1.02  Sodium 135 - 145 mmol/L 133(L) 134(L) 131(L)  Potassium 3.5 - 5.1 mmol/L 3.9 4.0 4.1  Chloride 98 - 111 mmol/L 99 - 93(L)  CO2 22 - 32 mmol/L 22 - 27  Calcium 8.9 - 10.3 mg/dL 8.5(L) - 9.4  Total Protein 6.5 - 8.1 g/dL - - 6.9  Total Bilirubin 0.3 - 1.2 mg/dL - - 1.5(H)  Alkaline Phos 38 - 126 U/L - - 63  AST 15 - 41 U/L - - 20  ALT 0 - 44 U/L - - 13    Estimated Creatinine Clearance: 41.3 mL/min (by C-G formula based on SCr of 1.01 mg/dL).  James Aline. Stanford Breed, MD [facility] Phone Number:  [phone] 06/23/2020 11:00 AM

## 2020-06-23 NOTE — Op Note (Addendum)
DATE OF SERVICE: 06/23/2020  PATIENT:  James Dickerson  80 y.o. male  PRE-OPERATIVE DIAGNOSIS:  Symptomatic infrarenal abdominal aortic aneurysm  POST-OPERATIVE DIAGNOSIS:  Same  PROCEDURE:   1) Bilateral open femoral artery exposure and direct repair (CPT [phone]) 2) Endovascular aortic aneurysm repair (CPT [phone]) 3) Right external iliac artery angioplasty and stenting (CPT [phone]) 4) Cystoscopy, suprapubic catheter and foley catheter placement (to be dictated separately by Dr. Tresa Moore)  SURGEON:  Surgeon(s) and Role: Panel 1:    * Cherre Robins, MD - Primary Panel 2:    * Alexis Frock, MD - Primary  ASSISTANT: Leontine Locket, PA-C  An assistant was required to facilitate exposure and expedite the case.  ANESTHESIA:   general  EBL: 111mL  BLOOD ADMINISTERED:none  DRAINS: none   LOCAL MEDICATIONS USED:  NONE  SPECIMEN:  none  COUNTS: confirmed correct.  TOURNIQUET:  None  PATIENT DISPOSITION:  PACU - hemodynamically stable.   Delay start of Pharmacological VTE agent (>24hrs) due to surgical blood loss or risk of bleeding: no  INDICATION FOR PROCEDURE: James Dickerson is a 80 y.o. male with symptomatic infrarenal abdominal aortic aneurysm.  He presented to [facility] emergency room with a day of right lower quadrant and mid lumbar back pain.  No other explanation was found for his abdominal discomfort.  CT angiogram was obtained.  This demonstrated interval growth of his aneurysm from 51 mm to 59 mm.  He also had a crescent of hematoma outside of a calcified aortic sac concerning for impending rupture.  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  After careful discussion of risks, benefits, and alternatives the patient was offered endovascular repair of the aorta with possible femoral-femoral bypass grafting. We specifically discussed risk of MI, stroke, death, wound infection, graft infection. The patient understood and wished to  proceed.  OPERATIVE FINDINGS:  Successful endovascular repair of abdominal aortic aneurysm and recanalization of right external iliac artery.  Devices used: - via 41F LCFA access: --- 28 x 14 x 120 mm Conformable excluder --- 20 x 128mm Gore iliac limb - via 67F RCFA access --- 20 x 33mm Gore iliac limb --- 11 x 20mm Viabahn stent to right external iliac artery  Case complicated by ureteral injury during foley placement. Dr. Tresa Moore placed suprapubic catheter and foley catheter at completion of case.  DESCRIPTION OF PROCEDURE: After identification of the patient in the pre-operative holding area, the patient was transferred to the operating room. The patient was positioned supine on the operating room table. Anesthesia was induced. The abdomen, groins, and thighs were prepped and draped in standard fashion. A surgical pause was performed confirming correct patient, procedure, and operative location.  Using intraoperative ultrasound, the course of the femoral arteries were mapped.  The femoral bifurcation was marked on the skin.  Oblique skin incisions were made over the femoral arteries bilaterally.  Incisions were carried down to the femoral sheath which was opened sharply bilaterally.  The femoral arteries were exposed, and encircled with Silastic Vesseloops.  Endovascular access was obtained in the left common femoral artery using micropuncture technique.  This was upsized to 8 [place].  A Omni Flush catheter was advanced over a Bentson wire into the terminal aorta.  Aortogram was obtained in a left oblique projection.  A short segment right external iliac artery occlusion was demonstrated.  The screen was marked.  Next endovascular access was obtained in the right common femoral artery using micropuncture technique.  I advanced the micro sheath into the distal, patent right external iliac artery.  The dilator and microwire were removed and the short segment occlusion was successfully crossed  endoluminal using a 035 Glidewire.  Endoluminal position was confirmed by advancing catheter over the wire into the terminal aorta and performing angiography.  Over the wire the micro sheath was removed and a 8 French sheath advanced into the iliac artery.  The catheter was advanced into the aorta over the wire.  A Lunderquist wire was advanced through the catheter and the catheter removed.  The 8 French sheath was upsized to a 12 [place] dry seal sheath.  The patient was heparinized.  In the right femoral access, the process was repeated a Lunderquist wire was advanced through the Omni Flush catheter.  The catheter was removed.  The sheath was removed.  An 41 French dry seal sheath was advanced into the terminal aorta.   Wire through the right common femoral artery access the Omni Flush catheter was positioned at the L1 vertebral body.  A 28 x 14 x 820 mm conformable excluder endoprosthesis was advanced over the left wire.  Aortogram was performed.  The renal arteries were marked.  The endoprosthesis was deployed in ballerina configuration using standard technique. Great care was taken to avoid covering the renal arteries.  Glidewire was advanced through the Omni Flush catheter in the right-sided access.  Once the Sater was reformed, the catheter was removed.  The wire was withdrawn.  A Kumpe catheter and Glidewire were used to select the endoprosthesis gait.  An Omni Flush catheter was advanced over the wire into the main body of the endoprosthesis.  The catheter was spun in the main body to confirm endoluminal position.  Lunderquist wire was advanced through the catheter into the proximal aorta and the catheter withdrawn.  Retrograde iliac angiography was performed via the right femoral access.  A 20 x 95 mm Gore iliac limb was advanced over the wire and deployed in standard fashion to avoid covering the right hypogastric artery.  Retrograde angiography was then performed via the left femoral access a  20 x 115 mm Gore iliac limb was deployed in standard fashion to avoid covering the left hypogastric artery.  Completion aortogram was performed.  The SMA, and renal arteries opacified.  Neither hypogastric artery initially opacified.  The right external iliac lesion was redemonstrated.  I elected to treat this endovascularly.  This was angioplastied with a 8 x 60 Mustang balloon.  This was followed by stent placement using a 11 x 50 mm Viabahn self-expanding covered stent.  This was postdilated with a 10 x 80 Mustang balloon.  Stiff wires were removed and an Omni Flush catheter positioned in the pararenal aorta.  Completion angiography was again performed.  We noted successful recanalization of the right external iliac artery.  The right hypogastric artery now opacified with contrast.  The left hypogastric artery was covered.  We occluded the right superficial femoral and profundofemoral artery origins.  The sheath was removed and a angled to be clamp used to occlude the common femoral artery.  4 interrupted 6-0 Prolene sutures were used to close the arteriotomy.  Immediately prior to completion the arteriotomy was flushed and de-aired.  The repair was completed clamps were released a palpable pulse was noted in the right femoral artery.  Doppler flow was noted in the right dorsalis pedis artery.  We occluded the left superficial femoral and profundofemoral artery origins.  The sheath was removed and  a angled to be clamp used to occlude the common femoral artery.  4 interrupted 6-0 Prolene sutures were used to close the arteriotomy.  Immediately prior to completion the arteriotomy was flushed and de-aired.  The repair was completed clamps were released a palpable pulse was noted in the left femoral artery.  Doppler flow was noted in the left dorsalis pedis artery.  Heparin was reversed with protamine.  Groins were closed in layers using 2-0 Vicryl, 3-0 Vicryl, 4-0 Monocryl.  Dermabond was applied.  At  this point Dr. Tresa Moore of Urology the case to assist draining the bladder.  His portion of the case will be dictated separately.  Upon completion of the case instrument and sharps counts were confirmed correct. The patient was transferred to the PACU in good condition. I was present for all portions of the procedure.  [name]. Stanford Breed, MD [facility] Phone Number: [phone] 06/23/2020 3:17 AM

## 2020-06-23 NOTE — Progress Notes (Signed)
Spoke to Dr. Tresa Moore regarding suprapubic catheter. Patient got hand around it and gauze is saturated at base. Dr. Tresa Moore said patient can be placed in pants or mittens to help from tugging at catheters ago, but he is not taking him back to the OR tonight. No additional imaging ordered.

## 2020-06-23 NOTE — Anesthesia Procedure Notes (Signed)
Arterial Line Insertion Start/End1/29/2022 11:20 PM, 06/22/2020 11:25 PM Performed by: Murvin Natal, MD, anesthesiologist  Patient location: OR. Preanesthetic checklist: patient identified, IV checked, site marked, risks and benefits discussed, surgical consent, monitors and equipment checked, pre-op evaluation, timeout performed and anesthesia consent Lidocaine 1% used for infiltration and patient sedated Left, radial was placed Catheter size: 20 Fr Hand hygiene performed , maximum sterile barriers used  and Seldinger technique used  Attempts: 1 Procedure performed using ultrasound guided technique. Ultrasound Notes:anatomy identified, needle tip was noted to be adjacent to the nerve/plexus identified and no ultrasound evidence of intravascular and/or intraneural injection Following insertion, dressing applied and Biopatch. Post procedure assessment: normal and unchanged  Patient tolerated the procedure well with no immediate complications.

## 2020-06-23 NOTE — Consult Note (Signed)
Reason for Consult: Difficult Foley, Urethral Stricture, Prostate Cancer  Referring Physician: Jamelle Haring MD  James Dickerson is an 80 y.o. male.  HPI:   1 - Difficult Foley, Urethral Stricture - Pt undergoing emergent AAA stenting found to have inability to place catheter at beginning of procedure.   2 - Prostate Cancer - s/p external beam radiation for unknown grade/stage disease.  Today "James Dickerson" is seen for emergent consultation for difficult foley during AAA endovascular stenting.    Past Medical History:  Diagnosis Date  . Back pain   . Cardiomegaly   . COPD (chronic obstructive pulmonary disease) ([place])   . Cystic kidney disease   . Depressive disorder   . GERD (gastroesophageal reflux disease)   . High cholesterol   . History of prostate cancer   . Hypertension   . Prostate cancer ([place]) 2018   Rad seeds  . Thyroid nodule     Past Surgical History:  Procedure Laterality Date  . APPENDECTOMY  1971  . PACEMAKER INSERTION N/A 08/06/2016   Procedure: INSERTION PACEMAKER;  Surgeon: Isaias Cowman, MD;  Location: [facility] ORS;  Service: Cardiovascular;  Laterality: N/A;    History reviewed. No pertinent family history.  Social History:  reports that he has been smoking cigarettes. He has a 16.75 pack-year smoking history. He has never used smokeless tobacco. He reports that he does not drink alcohol and does not use drugs.  Allergies: No Known Allergies  Medications: I have reviewed the patient's current medications.  Results for orders placed or performed during the hospital encounter of 06/22/20 (from the past 48 hour(s))  Type and screen [place]     Status: None   Collection Time: 06/22/20 10:50 PM  Result Value Ref Range   ABO/RH(D) A POS    Antibody Screen NEG    Sample Expiration      06/25/2020,2359 Performed at [facility], [place] [address] [place], [place] [address]   ABO/Rh     Status: None   Collection Time: 06/22/20 11:30  PM  Result Value Ref Range   ABO/RH(D)      A POS Performed at [facility] [address], [place], [place] [address]     CT Renal Stone Study  Result Date: 06/22/2020 CLINICAL DATA:  Right lower quadrant pain. EXAM: CT ABDOMEN AND PELVIS WITHOUT CONTRAST TECHNIQUE: Multidetector CT imaging of the abdomen and pelvis was performed following the standard protocol without IV contrast. COMPARISON:  CT dated 01/01/2020. FINDINGS: Lower chest: The lung bases are clear. The heart size is normal. Hepatobiliary: The liver is normal. Normal gallbladder.There is no biliary ductal dilation. Pancreas: The previously demonstrated cystic lesion of the pancreas is better visualized on prior study. Spleen: Unremarkable. Adrenals/Urinary Tract: --Adrenal glands: Unremarkable. --Right kidney/ureter: There is a punctate nonobstructing stone in the interpolar region of the right kidney. There is no right-sided hydronephrosis. --Left kidney/ureter: There is a cyst in the lower pole measuring approximately 4.9 cm. --Urinary bladder: Unremarkable. Stomach/Bowel: --Stomach/Duodenum: No hiatal hernia or other gastric abnormality. Normal duodenal course and caliber. --Small bowel: Unremarkable. --Colon: Unremarkable. --Appendix: Not visualized. No right lower quadrant inflammation or free fluid. Vascular/Lymphatic: Again noted is an infrarenal abdominal aortic aneurysm. This aneurysm measures approximately 5.9 cm (previously measuring up to approximately 5.4 cm. There are atherosclerotic changes of the abdominal aorta. 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 --No retroperitoneal lymphadenopathy. --No mesenteric lymphadenopathy. --No pelvic  or inguinal lymphadenopathy. Reproductive: Unremarkable Other: No ascites or free air. The abdominal wall is normal. Musculoskeletal. There is no acute compression fracture. Advanced degenerative changes are noted at  the L4-L5 and L5-S1 levels. IMPRESSION: 1. No acute intra-abdominal abnormality detected. No findings to explain the patient's right lower quadrant abdominal pain. 2. Substantial interval increase in size of the previously demonstrated infrarenal abdominal aortic aneurysm, currently measuring up to approximately 5.9 cm in diameter (previously measuring approximately 5.4 cm). There is a new focal outpouching of the proximal aneurysm which appears to be new since prior study in 2021. Given the patient's symptoms, follow-up with an emergent CT angiogram of the abdomen and pelvis is recommended. Recommend referral to a vascular specialist. This recommendation follows ACR consensus guidelines: White Paper of the ACR Incidental Findings Committee II on Vascular Findings. J Am Coll Radiol 2013; 10:789-794. 3. There is a punctate nonobstructing stone in the interpolar region of the right kidney. These results were called by telephone at the time of interpretation on 06/22/2020 at 4:58 pm to provider [facility] , who verbally acknowledged these results. Electronically Signed   By: Constance Holster M.D.   On: 06/22/2020 17:01   HYBRID OR IMAGING ([facility] ONLY)  Result Date: 06/22/2020 There is no interpretation for this exam.  This order is for images obtained during a surgical procedure.  Please See "Surgeries" Tab for more information regarding the procedure.   CT Angio Abd/Pel W and/or Wo Contrast  Result Date: 06/22/2020 CLINICAL DATA:  Abdominal aortic aneurysm increased on earlier noncontrast CT EXAM: CTA ABDOMEN AND PELVIS WITHOUT AND WITH CONTRAST TECHNIQUE: Multidetector CT imaging of the abdomen and pelvis was performed using the standard protocol during bolus administration of intravenous contrast. Multiplanar reconstructed images and MIPs were obtained and reviewed to evaluate the vascular anatomy. CONTRAST:  12mL OMNIPAQUE IOHEXOL 350 MG/ML SOLN IV COMPARISON:  Earlier contrast noncontrast study of  06/22/2020, prior CT angio abdomen and pelvis 01/01/2020 FINDINGS: VASCULAR Aorta: Scattered atherosclerotic calcifications. Aneurysmal dilatation of the infrarenal abdominal aorta to a greatest size of 5.9 x 5.5 cm, aneurysmal segment stenting approximately 7.5 cm length. Aorta measures 3.0 cm diameter at the aortic bifurcation. The high attenuation crescent seen within the aorta on the previous exam is less well demonstrated postcontrast but may represent intramural hematoma. Significant thrombus within aneurysm. No definite evidence of perianeurysmal hemorrhage/infiltration to suggest aneurysm rupture, though portions of the RIGHT lateral aspect of the aneurysm are not well defined due to presence of adjacent duodenum, IVC, and pancreas. Distal thoracic aorta measures 3.4 cm transverse image 1. Celiac: Minimal plaque at origin, widely patent SMA: Widely patent Renals: Minimal plaque at origin of LEFT renal artery. Both renal arteries appear widely patent IMA: Occluded at origin Inflow: Aneurysmal dilatation of common iliac arteries 17 mm diameter LEFT and 18 mm diameter RIGHT BILATERAL tortuosity of common iliac arteries. 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 Proximal Outflow: Atherosclerotic calcifications at the common femoral arteries bilaterally extending into the bifurcations. Lesser degree of enhancement of the RIGHT common femoral artery and RIGHT superficial femoral/profundus femoral arteries versus LEFT. Veins: Unopacified at time of CTA imaging Review of the MIP images confirms the above findings. NON-VASCULAR Lower chest: COPD changes with bibasilar atelectasis. Nodular area of atelectasis at base of RIGHT lower lobe, 11 x 7 mm, minimally more prominent than on prior study from 01/01/2020. Hepatobiliary: Gallbladder and liver unremarkable Pancreas: Atrophic without definite mass Spleen: Normal appearance  Adrenals/Urinary Tract:  Thickening of adrenal glands without mass. Normal appearing RIGHT kidney. Large cyst LEFT kidney 5.1 x 4.1 cm image 54. Ureters poorly visualized. Bladder unremarkable. Stomach/Bowel: Bowel loops unremarkable Lymphatic: No definite adenopathy Reproductive: Minimal prostatic enlargement Other: No free air or free fluid.  No hernia. Musculoskeletal: Osseous demineralization with degenerative disc disease changes especially at L4-L5 and L5-S1. IMPRESSION: Aneurysmal dilatation of the infrarenal abdominal aorta to a greatest size of 5.9 x 5.5 cm, increased in size since the previous exam, with significant thrombus within as well as a high attenuation crescent within the aneurysm on the earlier noncontrast exam suggesting acute intramural hematoma. No definite evidence of aneurysm rupture/leak as above. 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 COPD changes with bibasilar atelectasis and minimal increase in size of a nodular area of atelectasis at base of RIGHT lower lobe. Large LEFT renal cyst. Aortic Atherosclerosis (ICD10-I70.0). Aortic aneurysm NOS (ICD10-I71.9). Emphysema (ICD10-J43.9). Critical Value/emergent results were called by telephone at the time of interpretation on 06/22/2020 at 7:15 pm to provider [facility] , who verbally acknowledged these results. Electronically Signed   By: Lavonia Dana M.D.   On: 06/22/2020 19:12    Review of Systems  Unable to perform ROS: Intubated   Blood pressure (!) 152/98, pulse 75, temperature 98 F (36.7 C), temperature source Oral, resp. rate 16, SpO2 97 %. Physical Exam Constitutional:      Comments: Pt under general anesthesia in supine position OR [place]:     Head:     Comments: ETT in place Cardiovascular:     Rate and Rhythm: Normal rate.  Pulmonary:     Comments: Non-coarse on vent Abdominal:     General:  Abdomen is flat.     Comments: Bilateral groin incision sites, clean. No infruabominal scars. Radiaiton tattoo noted c/w prostate radiation.   Genitourinary:    Penis: Normal.      Comments: Blood at meatus Neurological:     Comments: GCS 3T     Assessment/Plan:  1 - Difficult Foley, Urethral Stricture - 2 surgeon emergent consent obtained for cyst, SPT, and urethral dilation as per separate op note.  SPT to remain capped and in place. Foley to remain to gravity drainage.   2 - Prostate Cancer - will check PSA to verify biochemical control.   Alexis Frock 06/23/2020, 3:32 AM

## 2020-06-23 NOTE — Progress Notes (Signed)
1 Day Post-Op   Subjective/Chief Complaint:   1 - Difficult Foley, Urethral Stricture - s/p SPT and foley placement / urethral re-alignment 06/23/20 at time of endovascular AAA repair.   2 - Prostate Cancer - s/p external beam radiation for unknown grade/stage disease. PSA 05/2020 pending. Follows in [place] per report.   Today "James Dickerson" is stable. Some post-op delirium and pulling at tubes, now much improved this AM.   Objective: Vital signs in last 24 hours: Temp:  [98 F (36.7 C)-99.3 F (37.4 C)] 99.3 F (37.4 C) (01/30 0751) Pulse Rate:  [59-90] 81 (01/30 0800) Resp:  [12-20] 13 (01/30 0800) BP: (112-180)/(69-113) 133/76 (01/30 0800) SpO2:  [95 %-100 %] 99 % (01/30 0837) Arterial Line BP: (139-202)/(51-91) 153/59 (01/30 0800) Weight:  [50 kg-58.1 kg] 50 kg (01/30 0614)    Intake/Output from previous day: 01/29 0701 - 01/30 0700 In: 3020.9 [I.V.:2370.9; IV Piggyback:600] Out: 1475 [Urine:1400; Blood:75] Intake/Output this shift: Total I/O In: 75.1 [I.V.:75.1] Out: -   General appearance: alert and very pleasant and AO x 3 this AM.  Eyes: negative, conjunctivae/corneas clear. PERRL, EOM's intact. Fundi benign. Nose: Nares normal. Septum midline. Mucosa normal. No drainage or sinus tenderness. Throat: lips, mucosa, and tongue normal; teeth and gums normal Neck: thyroid not enlarged, symmetric, no tenderness/mass/nodules Back: symmetric, no curvature. ROM normal. No CVA tenderness. Resp: non-labored on [place] O2 Cardio: Nl rate by monitor GI: soft, non-tender; bowel sounds normal; no masses,  no organomegaly Bilateral groin sites c/d/i, no hematomas GU: SPT in place, capped, some blood staining around insertion site as expected, no active bleeding. Urethral foley in place to gravity drain with medium pink urine, no clots. MSK: no c/c/e. Feet warm/perfused.   Lab Results:  Recent Labs    06/22/20 1407 06/22/20 2346 06/23/20 0529  WBC 4.5  --  6.5  HGB 13.4 12.9*  11.7*  HCT 40.6 38.0* 34.3*  PLT 192  --  153   BMET Recent Labs    06/22/20 1407 06/22/20 2346 06/23/20 0529  NA 131* 134* 133*  K 4.1 4.0 3.9  CL 93*  --  99  CO2 27  --  22  GLUCOSE 95  --  117*  BUN 15  --  11  CREATININE 1.02  --  1.01  CALCIUM 9.4  --  8.5*   PT/INR Recent Labs    06/23/20 0529  LABPROT 15.2  INR 1.2   ABG Recent Labs    06/22/20 2346  PHART 7.341*  HCO3 28.5*     Anti-infectives: Anti-infectives (From admission, onward)   Start     Dose/Rate Route Frequency Ordered Stop   06/23/20 0600  ceFAZolin (ANCEF) IVPB 2g/100 mL premix        2 g 200 mL/hr over 30 Minutes Intravenous Every 8 hours 06/23/20 0424 06/23/20 2159   06/22/20 2310  ceFAZolin (ANCEF) 2-4 GM/100ML-% IVPB       Note to Pharmacy: Ubaldo Glassing   : cabinet override      06/22/20 2310 06/23/20 1114      Assessment/Plan:  1 - Difficult Foley, Urethral Stricture - keep SPT capped and foley to gravity this admission. Will likely perform office trial of void in 3-4 weeks .   2 - Prostate Cancer -  PSA today to verify biochemical control.   Will follow. Call me directly with questions anytime.   Alexis Frock 06/23/2020

## 2020-06-23 NOTE — Brief Op Note (Signed)
06/23/2020  3:24 AM  PATIENT:  James Dickerson  80 y.o. male  PRE-OPERATIVE DIAGNOSIS:  Difficult Foley  POST-OPERATIVE DIAGNOSIS:  Urethral Stricture  PROCEDURE:  Cystoscopy, URethral Dilation, SP Tube Placement  SURGEON:  Surgeon(s) and Role:     Alexis Frock, MD - Primary  PHYSICIAN ASSISTANT:   ASSISTANTS: none   ANESTHESIA:   general  EBL:  minimal   BLOOD ADMINISTERED:none  DRAINS: 1 - 58F foley to gravity; 2 - 58F SPT capped   LOCAL MEDICATIONS USED:  NONE  SPECIMEN:  No Specimen  DISPOSITION OF SPECIMEN:  N/A  COUNTS:  YES  TOURNIQUET:  * No tourniquets in log *  DICTATION: .Other Dictation: Dictation Number  [id number]  PLAN OF CARE: Admit to inpatient   PATIENT DISPOSITION:  PACU - hemodynamically stable.   Delay start of Pharmacological VTE agent (>24hrs) due to surgical blood loss or risk of bleeding: yes

## 2020-06-23 NOTE — Anesthesia Procedure Notes (Signed)
Procedure Name: Intubation Date/Time: 06/22/2020 11:34 PM Performed by: Valetta Fuller, CRNA Pre-anesthesia Checklist: Patient identified, Emergency Drugs available, Suction available and Patient being monitored Patient Re-evaluated:Patient Re-evaluated prior to induction Oxygen Delivery Method: Circle system utilized Preoxygenation: Pre-oxygenation with 100% oxygen Induction Type: IV induction Ventilation: Two handed mask ventilation required Laryngoscope Size: Miller and 2 Grade View: Grade I Tube type: Oral Tube size: 8.0 mm Number of attempts: 1 Airway Equipment and Method: Stylet Placement Confirmation: ETT inserted through vocal cords under direct vision,  positive ETCO2 and breath sounds checked- equal and bilateral Secured at: 22 cm Tube secured with: Tape Dental Injury: Teeth and Oropharynx as per pre-operative assessment

## 2020-06-23 NOTE — Transfer of Care (Signed)
Immediate Anesthesia Transfer of Care Note  Patient: James Dickerson  Procedure(s) Performed: ABDOMINAL AORTIC ENDOVASCULAR STENT GRAFT (N/A Abdomen) INSERTION OF RIGHT EXTERNAL ILIAC ARTERY STENT (Right Groin) INSERTION OF SUPRAPUBIC CATHETER (N/A Bladder) CYSTOSCOPY WITH URETHRAL DILATATION (N/A Penis)  Patient Location: PACU  Anesthesia Type:General  Level of Consciousness: sedated  Airway & Oxygen Therapy: Patient connected to nasal cannula oxygen  Post-op Assessment: Report given to RN and Post -op Vital signs reviewed and stable  Post vital signs: Reviewed and stable  Last Vitals:  Vitals Value Taken Time  BP 163/96 06/23/20 0337  Temp    Pulse 76 06/23/20 0340  Resp 12 06/23/20 0340  SpO2 100 % 06/23/20 0340  Vitals shown include unvalidated device data.  Last Pain:  Vitals:   06/22/20 2150  TempSrc: Oral         Complications: No complications documented.

## 2020-06-24 ENCOUNTER — Encounter (HOSPITAL_COMMUNITY): Payer: Self-pay | Admitting: Vascular Surgery

## 2020-06-24 LAB — URINE CULTURE: Culture: NO GROWTH

## 2020-06-24 NOTE — Progress Notes (Signed)
Mobility Specialist: Progress Note   06/24/20 1231  Mobility  Activity Ambulated in room  Level of Assistance Minimal assist, patient does 75% or more  Assistive Device Front wheel walker  Distance Ambulated (ft) 8 ft (4'x2)  Mobility Response Tolerated poorly  Mobility performed by Mobility specialist  $Mobility charge 1 Mobility   Pre-Mobility: 83 HR, 145/87 BP, 97% SpO2 During Mobility: 103 HR Post-Mobility: 92 HR, 153/84 BP, 99% SpO2  Pt limited this session due to abdominal pain he rated 10/10. Pt stood EOB for a few seconds and needed to sit back down due to pain. Pt said he wanted to try to walk despite his pain. Pt walked 4' and needed to sit in room. Pt back to bed and is requesting pain medication, RN notified.   [facility] Mobility Specialist Mobility Specialist Phone: [phone]

## 2020-06-24 NOTE — Progress Notes (Signed)
PT Cancellation Note  Patient Details Name: James Dickerson MRN: [id number] DOB: [birth year]   Cancelled Treatment:    Reason Eval/Treat Not Completed: Pain limiting ability to participate. Patient reports 10/10 pain despite having received pain medicine. Wants to hold off for today. Will re-attempt evaluation tomorrow.    Sankalp Ferrell 06/24/2020, 2:10 PM

## 2020-06-24 NOTE — Op Note (Signed)
NAMEKRISTY, James Dickerson MEDICAL RECORD [id number] ACCOUNT [id number] DATE OF BIRTH:1940/09/02 FACILITY: [facility] LOCATION: [id number] PHYSICIAN:Leni Pankonin, MD  OPERATIVE REPORT  DATE OF PROCEDURE:  06/22/2020  PREOPERATIVE DIAGNOSIS:  Difficulty Foley  POSTOPERATIVE DIAGNOSES:  Urethral stricture, history of prostate cancer.  PROCEDURES: 1.  Cystoscopy with urethral dilation. 2.  Suprapubic tube placement.  ESTIMATED BLOOD LOSS:  Nil.  COMPLICATIONS:  None.  SPECIMENS:  None.  FINDINGS: 1.  Multifocal urethral stricture disease from the pendulous urethra to the prostate. 2.  Successful placement of 18-French Foley catheter following urethral dilation. 3.  Successful placement of 18-French suprapubic tube following dilation of tract.  COMPLICATIONS: 1.  Foley catheter to straight drain. 2.  Suprapubic tube capped.  INDICATIONS:  The patient is an 80 year old man who was undergoing emergent endovascular procedure for expanding abdominal aortic aneurysm.  The patient did have difficult Foley attempt at the beginning of the procedure.  As completely appropriate given  the urgent nature of his vascular issue, intraoperative consultation was sought for placement of the catheter at the end of this procedure.  He underwent successful endovascular stenting as per Dr. Luan Pulling.  The patient remained in OR 16, prepped and  draped.  Chart review revealed history of prostate cancer.  He did have radiation tattoos on his lower abdomen consistent with external beam radiation.  Given this, it was felt that stricture disease is most likely.  DESCRIPTION OF PROCEDURE:  A sterile field was created, prepped and draped the patient's penis, perineum, proximal thighs, and his infraumbilical abdomen using iodine, and cystourethroscopy was performed using 18-French flexible cystoscope.  Inspection  of the pendulous urethra revealed multifocal very dense stricture disease, some evidence of likely prior false  pass.  Multiple attempts were made to cannulate a true lumen with a sensor wire; however, this was completely unsuccessful.  I clearly felt  that antegrade approach and suprapubic tube placement would be most prudent.  As such, an 18-gauge spinal needle was used at 1/2 fingerbreadth superior to pubic ramus in the midline, and the bladder was easily cannulated as verified by efflux of clear  urine.  Via this tract, a 0.038 sensor wire was advanced into the urinary bladder.  An incision approximately 1/4-inch was made along this tract and the 24-French NephroMax balloon dilation apparatus was carefully advanced across this and inflated to a  pressure of 20 atmospheres, held for 90 seconds, and the sheath advanced to the level of the bladder.  Keeping the wire in place, cystoscope was used to visualize the bladder, revealed the suprapubic tract, which corroborated excellent placement.  To  further allow continuity of his penis and stricture, a sensor wire was able to be passed in antegrade fashion down the bladder neck through the entire pendulous urethra, and the NephroMax balloon dilation apparatus was again advanced across this through  the pendulous urethral strictured area and inflated to a pressure of 20 atmospheres, held for 90 seconds and then released, and the cystoscope was easily able to pass via antegrade fashion completely through.  An 18-French Councill type catheter was then  placed over the sensor working wire via the penile tract to level of the lower urinary bladder, 10 mL were placed in the balloon.  This was visualized placement via the cystoscope, and using a sensor wire again via the suprapubic tube tract, a separate  18-French Councill catheter was placed to the level of the urinary bladder.  10 mL of sterile water placed in the balloon.  The sheath was  removed.  Suprapubic tube was capped.  The pendulous urethra catheter was left to gravity drainage.  Procedure was  terminated.  The  patient tolerated the procedure well.  No immediate complications.  The patient was then taken to postanesthesia care unit in stable condition.  Please note 2-surgeon emergent consent was obtained for this procedure.  IN/NUANCE  D:06/23/2020 T:06/23/2020 JOB:[id number]

## 2020-06-24 NOTE — Anesthesia Postprocedure Evaluation (Signed)
Anesthesia Post Note  Patient: Presenter, broadcasting  Procedure(s) Performed: ABDOMINAL AORTIC ENDOVASCULAR STENT GRAFT (N/A Abdomen) INSERTION OF RIGHT EXTERNAL ILIAC ARTERY STENT (Right Groin) INSERTION OF SUPRAPUBIC CATHETER (N/A Bladder) CYSTOSCOPY WITH URETHRAL DILATATION (N/A Penis)     Patient location during evaluation: PACU Anesthesia Type: General Level of consciousness: awake Pain management: pain level controlled Vital Signs Assessment: post-procedure vital signs reviewed and stable Respiratory status: spontaneous breathing, nonlabored ventilation, respiratory function stable and patient connected to nasal cannula oxygen Cardiovascular status: blood pressure returned to baseline and stable Postop Assessment: no apparent nausea or vomiting Anesthetic complications: no   No complications documented.  Last Vitals:  Vitals:   06/23/20 2300 06/24/20 0400  BP: 108/69 108/68  Pulse: 76 67  Resp: 20 12  Temp: 37 C 37 C  SpO2: 97% 98%    Last Pain:  Vitals:   06/24/20 0400  TempSrc: Oral  PainSc: 0-No pain                 Ryla Cauthon P Lekha Dancer

## 2020-06-24 NOTE — Progress Notes (Addendum)
   VASCULAR SURGERY ASSESSMENT & PLAN:   2 Days Post-Op James Dickerson is a 80 y.o. male s/p EVAR / REIA stenting for symptomatic aortic aneurysm. Complicated by urethral injury during preoperative foley placement. No evidence of malperfusion. PRN pain control. PT / OT / OOB. Diet as tolerated. Critical foley / SP tube - do not manipulate. No new labs>>H and H, Scr normal yesterday. AM labs 2/1 ordered SUBJECTIVE:   Incisional pain  PHYSICAL EXAM:   Vitals:   06/23/20 2015 06/23/20 2100 06/23/20 2300 06/24/20 0400  BP:  110/66 108/69 108/68  Pulse:  68 76 67  Resp:  13 20 12   Temp:   98.6 F (37 C) 98.6 F (37 C)  TempSrc:  Oral Oral Oral  SpO2: 99% 97% 97% 98%  Weight:      Height:       Tmax 99.3 UOP: 2400 cc SaO2 normal on RA  General appearance: Awake, alert in no apparent distress Cardiac: Heart rate and rhythm are regular Respirations: Nonlabored Incisions: Bilateral groin incisions are all well approximated without bleeding or hematoma, Dressing over SP tube is dry and intact Extremities: Both feet are warm with intact sensation and motor function.  Palpable DP pulses. GU: supra-pubic tube to Foley gravity  LABS:   Lab Results  Component Value Date   WBC 6.5 06/23/2020   HGB 11.7 (L) 06/23/2020   HCT 34.3 (L) 06/23/2020   MCV 93.0 06/23/2020   PLT 153 06/23/2020   Lab Results  Component Value Date   CREATININE 1.01 06/23/2020   Lab Results  Component Value Date   INR 1.2 06/23/2020   CBG (last 3)  No results for input(s): GLUCAP in the last 72 hours.  PROBLEM LIST:    Active Problems:   Status post surgery   AAA (abdominal aortic aneurysm) (HCC)   CURRENT MEDS:   . acetaminophen  650 mg Oral Q6H  . amLODipine  5 mg Oral q morning - 10a  . aspirin EC  81 mg Oral Q0600  . atorvastatin  40 mg Oral QHS  . Chlorhexidine Gluconate Cloth  6 each Topical Daily  . cholecalciferol  2,000 Units Oral Daily  . docusate sodium  100 mg Oral Daily  .  donepezil  10 mg Oral QHS  . heparin  5,000 Units Subcutaneous Q8H  . mouth rinse  15 mL Mouth Rinse BID  . mometasone-formoterol  2 puff Inhalation BID  . pantoprazole  40 mg Oral BID  . tamsulosin  0.4 mg Oral Daily    Barbie Banner, [place] Office: [phone] 06/24/2020   VASCULAR STAFF ADDENDUM: I have independently interviewed and examined the patient. I agree with the above.  Looks good after emergent EVAR for symptomatic aneurysm, recanalization of right external iliac artery via bilateral groin cutdowns. Incisional tenderness is limiting his mobility. Needs to get out of bed, ambulate. Otherwise ready for discharge once he feels confident caring for himself at home. Foley and suprapubic catheters should remain in place as directed by urology.  He should follow up with urology as an outpatient. Follow-up with me in 4 weeks with CTA chest, abdomen, and pelvis  [name]. Stanford Breed, MD [facility] Phone Number: [phone] 06/24/2020 9:08 AM

## 2020-06-25 ENCOUNTER — Other Ambulatory Visit: Payer: Self-pay

## 2020-06-25 LAB — BASIC METABOLIC PANEL
Anion gap: 7 (ref 5–15)
BUN: 8 mg/dL (ref 8–23)
CO2: 27 mmol/L (ref 22–32)
Calcium: 8.5 mg/dL — ABNORMAL LOW (ref 8.9–10.3)
Chloride: 100 mmol/L (ref 98–111)
Creatinine, Ser: 0.91 mg/dL (ref 0.61–1.24)
GFR, Estimated: >60 mL/min (ref >60)
Glucose, Bld: 88 mg/dL (ref 70–99)
Potassium: 3.8 mmol/L (ref 3.5–5.1)
Sodium: 134 mmol/L — ABNORMAL LOW (ref 135–145)

## 2020-06-25 LAB — CBC
HCT: 29.6 % — ABNORMAL LOW (ref 39.0–52.0)
Hemoglobin: 10.2 g/dL — ABNORMAL LOW (ref 13.0–17.0)
MCH: 32.2 pg (ref 26.0–34.0)
MCHC: 34.5 g/dL (ref 30.0–36.0)
MCV: 93.4 fL (ref 80.0–100.0)
Platelets: 160 10*3/uL (ref 150–400)
RBC: 3.17 MIL/uL — ABNORMAL LOW (ref 4.22–5.81)
RDW: 13 % (ref 11.5–15.5)
WBC: 7.4 10*3/uL (ref 4.0–10.5)
nRBC: 0 % (ref 0.0–0.2)

## 2020-06-25 NOTE — Progress Notes (Deleted)
Patient lives home alone. Patient rents month to month at his current location. Daughter lives near by. Patient does not want to go back to his current housing.   Please put in a social worker consult for this patient so that he will have a place to go when he is discharged. Thank you, nursing

## 2020-06-25 NOTE — Discharge Instructions (Signed)
  [facility] [place]   Discharge Instructions  Endovascular Aortic Aneurysm Repair  Please refer to the following instructions for your post-procedure care. Your surgeon or Physician Assistant will discuss any changes with you.  Activity  You are encouraged to walk as much as you can. You can slowly return to normal activities but must avoid strenuous activity and heavy lifting until your doctor tells you it's OK. Avoid activities such as vacuuming or swinging a gold club. It is normal to feel tired for several weeks after your surgery. Do not drive until your doctor gives the OK and you are no longer taking prescription pain medications. It is also normal to have difficulty with sleep habits, eating, and bowel movements after surgery. These will go away with time.  Bathing/Showering  Shower daily after you go home.  Do not soak in a bathtub, hot tub, or swim until the incision heals completely.  If you have incisions in your groin, wash the groin wounds with soap and water daily and pat dry. (No tub bath-only shower)  Then put a dry gauze or washcloth there to keep this area dry to help prevent wound infection daily and as needed.  Do not use Vaseline or neosporin on your incisions.  Only use soap and water on your incisions and then protect and keep dry.  Incision Care  Shower every day. Clean your incision with mild soap and water. Pat the area dry with a clean towel. You do not need a bandage unless otherwise instructed. Do not apply any ointments or creams to your incision. If you clothing is irritating, you may cover your incision with a dry gauze pad.  Diet  Resume your normal diet. There are no special food restrictions following this procedure. A low fat/low cholesterol diet is recommended for all patients with vascular disease. In order to heal from your surgery, it is CRITICAL to get adequate nutrition. Your body requires vitamins, minerals, and protein.  Vegetables are the best source of vitamins and minerals. Vegetables also provide the perfect balance of protein. Processed food has little nutritional value, so try to avoid this.  Medications  Resume taking all of your medications unless your doctor or nurse practitioner tells you not to. If your incision is causing pain, you may take over-the-counter pain relievers such as acetaminophen (Tylenol). If you were prescribed a stronger pain medication, please be aware these medications can cause nausea and constipation. Prevent nausea by taking the medication with a snack or meal. Avoid constipation by drinking plenty of fluids and eating foods with a high amount of fiber, such as fruits, vegetables, and grains.  Do not take Tylenol if you are taking prescription pain medications.   Follow up  Our office will schedule a follow-up appointment with a CT scan 3-4 weeks after your surgery.  Please call us immediately for any of the following conditions  Severe or worsening pain in your legs or feet or in your abdomen back or chest. Increased pain, redness, drainage (pus) from your incision site. Increased abdominal pain, bloating, nausea, vomiting or persistent diarrhea. Fever of 101 degrees or higher. Swelling in your leg (s),  Reduce your risk of vascular disease  Stop smoking. If you would like help call [facility] at 1-800-QUIT-NOW (1-800-784-8669) or [place] at [phone]. Manage your cholesterol Maintain a desired weight Control your diabetes Keep your blood pressure down  If you have questions, please call the office at [phone].  

## 2020-06-25 NOTE — Progress Notes (Signed)
Patient does not want to go back to his current housing. Please discuss with patient. thanks

## 2020-06-25 NOTE — Progress Notes (Signed)
Mobility Specialist: Progress Note   06/25/20 1258  Mobility  Activity Ambulated in hall  Level of Assistance Contact guard assist, steadying assist  Assistive Device Front wheel walker  Distance Ambulated (ft) 80 ft  Mobility Response Tolerated well  Mobility performed by Mobility specialist  $Mobility charge 1 Mobility   Pre-Mobility: 78 HR, 107/58 BP, 95% SpO2 Post-Mobility: 94 HR, 113/73 BP, 97% SpO2  Pt c/o feeling fatigued and having some pain during walk. Pt c/o 8/10 abdominal pain today. Pt back to bed per request to eat lunch.   [facility] [employer] Mobility Specialist Mobility Specialist Phone: [phone]

## 2020-06-25 NOTE — Evaluation (Signed)
Occupational Therapy Evaluation Patient Details Name: James Dickerson MRN: [id number] DOB: [birth year] Today's Date: 06/25/2020    History of Present Illness He presented to [facility] ER reporting a day of right lower quadrant/iliac crest pain and mid lumbar pain.  He reports he has had nausea with poor appetite over the past several months. 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 1/30 pt underwent bilateral open femoral artery exposure and direct repair; endovascular aortic aneurysm repair; right external iliac artery angioplasty and stenting; and cystoscopy, suprapubic catheter and foley catheter placement.   Clinical Impression   This 80 yo male admitted and underwent above presents to acute OT with PLOF of being able to do all of his own basic ADLs and IADls. Currently he is setup/S-min A for basic ADLs and min A-minguard A for mobility. He will continue to benefit from acute OT with follow up at SNF to get back to his PLOF. Contacted CM through secure chat to see if she could A him with his worries that he may lose his apartment since he is in hospital and cannot get there to pay his rent.    Follow Up Recommendations  SNF;Supervision/Assistance - 24 hour    Equipment Recommendations  Other (comment) (TBD next venue)       Precautions / Restrictions Precautions Precautions: Fall Precaution Comments: Lower abodominal incision and suprapubic catheter Restrictions Weight Bearing Restrictions: No      Mobility Bed Mobility Overal bed mobility: Needs Assistance Bed Mobility: Sidelying to Sit   Sidelying to sit: Min guard;HOB elevated       General bed mobility comments: Pt was already laying on his left side upon entry, VCs for technique to sit up from that position to cause less pain    Transfers Overall transfer level: Needs assistance Equipment used: Rolling walker (2 wheeled) Transfers: Sit to/from Stand Sit to Stand: Min assist         General  transfer comment: VCs for safe hand placement    Balance Overall balance assessment: Needs assistance Sitting-balance support: No upper extremity supported;Feet supported Sitting balance-Leahy Scale: Good     Standing balance support: Bilateral upper extremity supported Standing balance-Leahy Scale: Poor                             ADL either performed or assessed with clinical judgement   ADL Overall ADL's : Needs assistance/impaired Eating/Feeding: Independent;Sitting   Grooming: Set up;Sitting;Wash/dry face;Oral care;Supervision/safety   Upper Body Bathing: Supervision/ safety;Set up;Sitting   Lower Body Bathing: Minimal assistance Lower Body Bathing Details (indicate cue type and reason): min guard A sit<>stand Upper Body Dressing : Set up;Sitting;Supervision/safety   Lower Body Dressing: Minimal assistance Lower Body Dressing Details (indicate cue type and reason): min guard A sit<>stand Toilet Transfer: Minimal assistance;Ambulation;RW Toilet Transfer Details (indicate cue type and reason): bed>out and down hallway>back to room to sit in recliner Toileting- Clothing Manipulation and Hygiene: Min guard;Sit to/from stand               Vision Baseline Vision/History: Wears glasses Patient Visual Report: No change from baseline              Pertinent Vitals/Pain Pain Assessment: Faces Faces Pain Scale: Hurts little more Pain Location: incisional when he moves Pain Descriptors / Indicators: Sore Pain Intervention(s): Limited activity within patient's tolerance;Monitored during session;Repositioned     Hand Dominance Right   Extremity/Trunk Assessment Upper Extremity Assessment Upper Extremity Assessment:  Generalized weakness           Communication Communication Communication: Other (comment) (talks in a low voice (normal for him))   Cognition Arousal/Alertness: Awake/alert Behavior During Therapy: WFL for tasks assessed/performed                                    General Comments: Showed decreased safety with RW (first time he has used one)              Home Living Family/patient expects to be discharged to:: Private residence Living Arrangements: Alone   Type of Home: Apartment Home Access: Level entry     Home Layout: One level     Bathroom Shower/Tub: Teacher, early years/pre: [place]: None          Prior Functioning/Environment Level of Independence: Independent        Comments: was drivng a scooter but said he had to sell it. Now he has to pay others to go get food for him        OT Problem List: Decreased strength;Impaired balance (sitting and/or standing);Pain      OT Treatment/Interventions: Self-care/ADL training;DME and/or AE instruction;Patient/family education;Balance training    OT Goals(Current goals can be found in the care plan section) Acute Rehab OT Goals Patient Stated Goal: to get stronger--agreeable to rehab before home OT Goal Formulation: With patient Time For Goal Achievement: 07/09/20 Potential to Achieve Goals: Good  OT Frequency: Min 2X/week   Barriers to D/C: Decreased caregiver support          Co-evaluation PT/OT/SLP Co-Evaluation/Treatment: Yes Reason for Co-Treatment: For patient/therapist safety;To address functional/ADL transfers PT goals addressed during session: Mobility/safety with mobility;Balance;Proper use of DME;Strengthening/ROM OT goals addressed during session: Strengthening/ROM;ADL's and self-care      AM-PAC OT "6 Clicks" Daily Activity     Outcome Measure Help from another person eating meals?: None Help from another person taking care of personal grooming?: A Little Help from another person toileting, which includes using toliet, bedpan, or urinal?: A Little Help from another person bathing (including washing, rinsing, drying)?: A Little Help from another person to put on and taking off  regular upper body clothing?: A Little Help from another person to put on and taking off regular lower body clothing?: A Little 6 Click Score: 19   End of Session Equipment Utilized During Treatment: Gait belt;Rolling walker  Activity Tolerance: Patient tolerated treatment well Patient left: in chair;with call bell/phone within reach  OT Visit Diagnosis: Unsteadiness on feet (R26.81);Other abnormalities of gait and mobility (R26.89);Muscle weakness (generalized) (M62.81);Pain Pain - part of body:  (incisional)                Time: 2376-2831 OT Time Calculation (min): 28 min Charges:  OT General Charges $OT Visit: 1 Visit OT Evaluation $OT Eval Moderate Complexity: [place], OTR/L Acute [employer] Pager [phone] Office [phone]     Almon Register 06/25/2020, 3:17 PM

## 2020-06-25 NOTE — Progress Notes (Addendum)
  Progress Note    06/25/2020 7:15 AM 3 Days Post-Op  Subjective:  Says his abdominal pain is better.  Tm 99.3 HR 70's-90's  885'O-277'A systolic 12% RA  Vitals:   06/24/20 2350 06/25/20 0400  BP:  128/70  Pulse: 90 86  Resp: 16 16  Temp: 99.3 F (37.4 C) 99.1 F (37.3 C)  SpO2: 97% 97%    Physical Exam: Cardiac:  regular Lungs:  Non labored Extremities:  Easily palpable right DP and left DP/PT pulses Abdomen:  Soft, NT; SP cath in place with dressing with minimal bloody drainage present.   CBC    Component Value Date/Time   WBC 7.4 06/25/2020 0045   RBC 3.17 (L) 06/25/2020 0045   HGB 10.2 (L) 06/25/2020 0045   HGB 14.8 08/10/2011 0555   HCT 29.6 (L) 06/25/2020 0045   HCT 44.3 08/10/2011 0555   PLT 160 06/25/2020 0045   PLT 183 08/10/2011 0555   MCV 93.4 06/25/2020 0045   MCV 94 08/10/2011 0555   MCH 32.2 06/25/2020 0045   MCHC 34.5 06/25/2020 0045   RDW 13.0 06/25/2020 0045   RDW 13.3 08/10/2011 0555   LYMPHSABS 0.6 (L) 06/15/2018 1722   LYMPHSABS 1.8 08/10/2011 0555   MONOABS 0.5 06/15/2018 1722   MONOABS 0.5 08/10/2011 0555   EOSABS 0.0 06/15/2018 1722   EOSABS 0.1 08/10/2011 0555   BASOSABS 0.0 06/15/2018 1722   BASOSABS 0.0 08/10/2011 0555    BMET    Component Value Date/Time   NA 134 (L) 06/25/2020 0045   NA 136 08/10/2011 0555   K 3.8 06/25/2020 0045   K 4.6 08/10/2011 0555   CL 100 06/25/2020 0045   CL 99 08/10/2011 0555   CO2 27 06/25/2020 0045   CO2 32 08/10/2011 0555   GLUCOSE 88 06/25/2020 0045   GLUCOSE 81 08/10/2011 0555   BUN 8 06/25/2020 0045   BUN 14 08/10/2011 0555   CREATININE 0.91 06/25/2020 0045   CREATININE 1.17 08/10/2011 0555   CALCIUM 8.5 (L) 06/25/2020 0045   CALCIUM 8.7 08/10/2011 0555   GFRNONAA >60 06/25/2020 0045   GFRNONAA >60 08/10/2011 0555   GFRAA >60 06/15/2018 1603   GFRAA >60 08/10/2011 0555    INR    Component Value Date/Time   INR 1.2 06/23/2020 0529     Intake/Output Summary (Last 24  hours) at 06/25/2020 0715 Last data filed at 06/25/2020 0544 Gross per 24 hour  Intake 1320 ml  Output 2525 ml  Net -1205 ml     Assessment:  80 y.o. male is s/p:  EVAR / REIA stenting for symptomatic aortic aneurysm. Complicated by urethral injury during preoperative foley placement.   3 Days Post-Op  Plan: -pt states his abdominal pain that prevented him from walking yesterday is much better.   Mobilize more today. -he does have palpable pedal pulses. -normal renal function. -PSA elevated-management per urology.  SP catheter-do not manipulate-management per urology. -DVT prophylaxis:  Sq heparin   Leontine Locket, PA-C [facility] [phone] 06/25/2020 7:15 AM  VASCULAR STAFF ADDENDUM: I have independently interviewed and examined the patient. I agree with the above.  Pain with ambulation yesterday - he was unsure if it was incisional or abdominal. No pain on my evaluation today. Clean and dry incisions. 2+ DP pulses bilaterally. Needs to mobilize. PT / OT / OOB.  [name]. Stanford Breed, MD [facility] Phone Number: [phone] 06/25/2020 7:45 AM

## 2020-06-25 NOTE — Progress Notes (Signed)
3 Days Post-Op   Subjective/Chief Complaint:   1 - Difficult Foley, Urethral Stricture - s/p SPT and foley placement / urethral re-alignment 06/23/20 at time of endovascular AAA repair.   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  CT 05/2020 w/o pelvic adenopathy. PSA 05/2020 7.05 c/w some active disease. Follows in [place] per report.   Today "James Dickerson" is stable. Abd pain much improved after AAA repair. Uop good and Hgb acceptable. PSA c/w some active prostate cancer but no mets on imaging.      Objective: Vital signs in last 24 hours: Temp:  [98.7 F (37.1 C)-99.3 F (37.4 C)] 99.1 F (37.3 C) (02/01 0400) Pulse Rate:  [72-90] 72 (02/01 0805) Resp:  [16-20] 20 (02/01 0805) BP: (111-149)/(63-80) 128/70 (02/01 0400) SpO2:  [95 %-97 %] 97 % (02/01 0805) Last BM Date:  (unk)  Intake/Output from previous day: 01/31 0701 - 02/01 0700 In: 1320 [P.O.:120; I.V.:1200] Out: 2525 [Urine:2525] Intake/Output this shift: No intake/output data recorded.   General appearance: alert and very pleasant and AO x 3 this AM. NSG and NSG student at bedside.  Eyes: negative, conjunctivae/corneas clear. PERRL, EOM's intact. Fundi benign. Nose: Nares normal. Septum midline. Mucosa normal. No drainage or sinus tenderness. Throat: lips, mucosa, and tongue normal; teeth and gums normal Neck: thyroid not enlarged, symmetric, no tenderness/mass/nodules Back: symmetric, no curvature. ROM normal. No CVA tenderness. Resp: non-labored on [place] O2 Cardio: Nl rate by monitor GI: soft, non-tender; bowel sounds normal; no masses,  no organomegaly Bilateral groin sites c/d/i, no hematomas GU: SPT in place, capped, some blood staining around insertion site as expected, no active bleeding. Urethral foley in place to gravity drain with medium pink urine, no clots. MSK: no c/c/e. Feet warm/perfused.   Lab Results:  Recent Labs    06/23/20 0529 06/25/20 0045   WBC 6.5 7.4  HGB 11.7* 10.2*  HCT 34.3* 29.6*  PLT 153 160   BMET Recent Labs    06/23/20 0529 06/25/20 0045  NA 133* 134*  K 3.9 3.8  CL 99 100  CO2 22 27  GLUCOSE 117* 88  BUN 11 8  CREATININE 1.01 0.91  CALCIUM 8.5* 8.5*   PT/INR Recent Labs    06/23/20 0529  LABPROT 15.2  INR 1.2   ABG Recent Labs    06/22/20 2346  PHART 7.341*  HCO3 28.5*    Studies/Results: No results found.  Anti-infectives: Anti-infectives (From admission, onward)   Start     Dose/Rate Route Frequency Ordered Stop   06/23/20 0600  ceFAZolin (ANCEF) IVPB 2g/100 mL premix        2 g 200 mL/hr over 30 Minutes Intravenous Every 8 hours 06/23/20 0424 06/23/20 1428   06/22/20 2310  ceFAZolin (ANCEF) 2-4 GM/100ML-% IVPB       Note to Pharmacy: James Dickerson   : cabinet override      06/22/20 2310 06/23/20 1114      Assessment/Plan:  1 - Difficult Foley, Urethral Stricture - keep SPT capped and foley to gravity this admission. DC with same tubes. Will likely perform office trial of void in 3-4 weeks .   2 - Recurrent Prostate Cancer -  Consider androgen deprivation in the future pending his wishes, he is NOT candidate for any sort of salvage surgery.   Will follow PRN at this point. Please call me directly anytime with questions.   Alexis Frock 06/25/2020

## 2020-06-25 NOTE — TOC Initial Note (Signed)
Transition of Care [facility]) - Initial/Assessment Note    Patient Details  Name: James Dickerson MRN: [id number] Date of Birth: June 21, 1940  Transition of Care [facility]) CM/SW Contact:    Vinie Sill, [place] Phone Number: 06/25/2020, 5:13 PM  Clinical Narrative:                  CSW visit with patient at bedside. CSW introduced self and explained role. CSW discussed with patient therapy recommendation of short term rehab at [facility]. Patietn states he lives home alone and is agreeable to short term rehab at [facility]. CSW explained the SNF process. CSW ecplained the importance of working with PT. CSW encouraged the patient to try to participate. Patient states no preferred SNF and was agreeable to CSW sending referrals to SNFs in [place] area. Patient states he has received covid vaccines.   CSW inquired if he had any concerns regarding his shelter. CSW advised was informed he leases apartment from month to month- CSW asked if his daughters will be able assist with making sure his bills were managed while at rehab. Patients states his daughter Josephina Shih, that lives in [place] should be able to assist. No other concerns or questions noted at this time.  CSW will continue to follow and assist with discharge planning.  Thurmond Butts, MSW, LCSW Clinical Social Worker   Expected Discharge Plan: Skilled Nursing Facility Barriers to Discharge: SNF Pending bed offer,Continued Medical Work up,Insurance Authorization   Patient Goals and CMS Choice        Expected Discharge Plan and Services Expected Discharge Plan: [place] In-house Referral: Clinical Social Work     Living arrangements for the past 2 months: Apartment                                      Prior Living Arrangements/Services Living arrangements for the past 2 months: Apartment Lives with:: Self Patient language and need for interpreter reviewed:: No        Need for Family Participation in  Patient Care: Yes (Comment) Care giver support system in place?: Yes (comment)   Criminal Activity/Legal Involvement Pertinent to Current Situation/Hospitalization: No - Comment as needed  Activities of Daily Living      Permission Sought/Granted Permission sought to share information with : Family Supports Permission granted to share information with : Yes, Verbal Permission Granted  Share Information with NAME: Sissy Allston  Permission granted to share info w AGENCY: SNFs  Permission granted to share info w Relationship: daughter  Permission granted to share info w Contact Information: [phone]  Emotional Assessment Appearance:: Appears younger than stated age Attitude/Demeanor/Rapport: Engaged Affect (typically observed): Accepting,Pleasant,Appropriate Orientation: : Oriented to Self,Oriented to Place,Oriented to  Time,Oriented to Situation Alcohol / Substance Use: Not Applicable Psych Involvement: No (comment)  Admission diagnosis:  Status post surgery [Z98.890] AAA (abdominal aortic aneurysm) [facility]) [I71.4] Patient Active Problem List   Diagnosis Date Noted  . AAA (abdominal aortic aneurysm) ([place]) 06/23/2020  . Status post surgery 06/22/2020  . Thoracic aortic aneurysm without rupture ([place]) 12/18/2019  . Respiratory failure ([place]) 07/11/2017  . Acute respiratory failure ([place]) 07/10/2017  . CAP (community acquired pneumonia) 07/10/2017  . Influenza A 07/10/2017  . COPD exacerbation ([place]) 07/10/2017  . HLD (hyperlipidemia) 03/09/2017  . Essential hypertension 08/14/2016  . 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 Bradycardia 08/05/2016   PCP:  Brunetta Genera,  [place], MD Pharmacy:   [id number] - [place], [place] - [address] [place] [address] Phone: [phone] Fax: [phone]     Social Determinants of Health (SDOH) Interventions    Readmission Risk Interventions No flowsheet data found.

## 2020-06-25 NOTE — Evaluation (Signed)
Physical Therapy Evaluation Patient Details Name: James Dickerson MRN: [id number] DOB: 1940-11-21 Today's Date: 06/25/2020   History of Present Illness  He presented to [facility] ER reporting a day of right lower quadrant/iliac crest pain and mid lumbar pain.  He reports he has had nausea with poor appetite over the past several months. 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 1/30 pt underwent bilateral open femoral artery exposure and direct repair; endovascular aortic aneurysm repair; right external iliac artery angioplasty and stenting; and cystoscopy, suprapubic catheter and foley catheter placement.  Clinical Impression  Prior to admission, pt lived in an apartment alone and was independent. Pt reports he is worried he may lose his apartment while in the hospital (CM notified). Pt presents with weakness, pain, and balance deficits. Ambulating x 90 feet with a walker at a min assist level. SpO2 98% on RA, HR peak 106 bpm. Would benefit from SNF at discharge to maximize functional independence.     Follow Up Recommendations SNF    Equipment Recommendations  Rolling walker with 5" wheels    Recommendations for Other Services       Precautions / Restrictions Precautions Precautions: Fall Precaution Comments: Lower abodominal incision and suprapubic catheter Restrictions Weight Bearing Restrictions: No      Mobility  Bed Mobility Overal bed mobility: Needs Assistance Bed Mobility: Sidelying to Sit   Sidelying to sit: Min guard;HOB elevated       General bed mobility comments: Pt was already laying on his left side upon entry, VCs for technique to sit up from that position to cause less pain    Transfers Overall transfer level: Needs assistance Equipment used: Rolling walker (2 wheeled) Transfers: Sit to/from Stand Sit to Stand: Min assist         General transfer comment: MinA to rise from edge of bed, cues for hand  placement  Ambulation/Gait Ambulation/Gait assistance: Min assist Gait Distance (Feet): 90 Feet Assistive device: Rolling walker (2 wheeled) Gait Pattern/deviations: Step-through pattern;Decreased stride length;Trunk flexed Gait velocity: decreased   General Gait Details: Max cues for walker proximity, upward gaze, manual assist provided intermittently for walker negotiation.  Stairs            Wheelchair Mobility    Modified Rankin (Stroke Patients Only)       Balance Overall balance assessment: Needs assistance Sitting-balance support: No upper extremity supported;Feet supported Sitting balance-Leahy Scale: Good     Standing balance support: Bilateral upper extremity supported Standing balance-Leahy Scale: Poor                               Pertinent Vitals/Pain Pain Assessment: Faces Faces Pain Scale: Hurts little more Pain Location: incisional when he moves Pain Descriptors / Indicators: Sore Pain Intervention(s): Monitored during session    Home Living Family/patient expects to be discharged to:: Private residence Living Arrangements: Alone Available Help at Discharge: Family;Available PRN/intermittently Type of Home: Apartment Home Access: Level entry     Home Layout: One level Home Equipment: None      Prior Function Level of Independence: Independent         Comments: was drivng a scooter but said he had to sell it. Now he has to pay others to go get food for him     Hand Dominance   Dominant Hand: Right    Extremity/Trunk Assessment   Upper Extremity Assessment Upper Extremity Assessment: Defer to OT evaluation  Lower Extremity Assessment Lower Extremity Assessment: Overall WFL for tasks assessed       Communication   Communication: Other (comment) (talks in a low voice (baseline))  Cognition Arousal/Alertness: Awake/alert Behavior During Therapy: WFL for tasks assessed/performed Overall Cognitive Status: Within  Functional Limits for tasks assessed                                 General Comments: Showed decreased safety with RW (first time he has used one)      General Comments      Exercises     Assessment/Plan    PT Assessment Patient needs continued PT services  PT Problem List Decreased strength;Decreased activity tolerance;Decreased balance;Decreased mobility;Pain       PT Treatment Interventions DME instruction;Gait training;Functional mobility training;Therapeutic activities;Therapeutic exercise;Balance training;Patient/family education;Stair training    PT Goals (Current goals can be found in the Care Plan section)  Acute Rehab PT Goals Patient Stated Goal: to get stronger--agreeable to rehab before home PT Goal Formulation: With patient Time For Goal Achievement: 07/09/20 Potential to Achieve Goals: Good    Frequency Min 3X/week   Barriers to discharge        Co-evaluation   Reason for Co-Treatment: For patient/therapist safety;To address functional/ADL transfers PT goals addressed during session: Mobility/safety with mobility;Balance;Proper use of DME;Strengthening/ROM OT goals addressed during session: Strengthening/ROM;ADL's and self-care       AM-PAC PT "6 Clicks" Mobility  Outcome Measure Help needed turning from your back to your side while in a flat bed without using bedrails?: None Help needed moving from lying on your back to sitting on the side of a flat bed without using bedrails?: A Little Help needed moving to and from a bed to a chair (including a wheelchair)?: A Little Help needed standing up from a chair using your arms (e.g., wheelchair or bedside chair)?: A Little Help needed to walk in hospital room?: A Little Help needed climbing 3-5 steps with a railing? : A Lot 6 Click Score: 18    End of Session Equipment Utilized During Treatment: Gait belt Activity Tolerance: Patient tolerated treatment well Patient left: in chair;with  call bell/phone within reach Nurse Communication: Mobility status PT Visit Diagnosis: Unsteadiness on feet (R26.81);Muscle weakness (generalized) (M62.81);Difficulty in walking, not elsewhere classified (R26.2);Pain Pain - part of body:  (abdomen)    Time: 5638-7564 PT Time Calculation (min) (ACUTE ONLY): 25 min   Charges:   PT Evaluation $PT Eval Moderate Complexity: 1 Mod          Wyona Almas, PT, DPT Acute Rehabilitation Services Pager [phone] Office [phone]   Deno Etienne 06/25/2020, 5:16 PM

## 2020-06-26 NOTE — Progress Notes (Signed)
Mobility Specialist - Progress Note   06/26/20 1119  Mobility  Activity Ambulated in room  Level of Assistance Standby assist, set-up cues, supervision of patient - no hands on  Assistive Device Front wheel walker  Distance Ambulated (ft) 30 ft  Mobility Response Tolerated poorly  Mobility performed by Mobility specialist  $Mobility charge 1 Mobility   Pre-mobility: 78 HR, 98% SpO2 During mobility: 91 HR, 89% SpO2 Post-mobility: 74 HR, 99% SpO2  Pt did not require assistance for bed mobility or standing from bed. He ambulated on RA. Upon reaching the door of his room, he endorsed feeling weak and SOB. Pt ambulated back to bed and SpO2 rose to upper 90s w/ pursed lip breathing. RN aware.   Pricilla Handler Mobility Specialist Mobility Specialist Phone: [phone]

## 2020-06-26 NOTE — Plan of Care (Signed)
  Problem: Clinical Measurements: Goal: Respiratory complications will improve Outcome: Progressing Goal: Cardiovascular complication will be avoided Outcome: Progressing   

## 2020-06-26 NOTE — Progress Notes (Signed)
Patient laying in bed in stable condition watching tv upon my arrival.  Patient states he is not having any pain at this time.  Patient is alert and oriented.  Vitals already checked and stable.  Patient states he does not need anything at this time.  Will continue to monitor and follow orders.

## 2020-06-26 NOTE — Progress Notes (Addendum)
Progress Note    06/26/2020 7:07 AM 4 Days Post-Op  Subjective:  Denies any abdominal pain.  Says he doesn't want to go back to current facility.  Says he walked some yesterday.    Afebrile HR 60's-80's  811'B-147'W systolic 29% RA  Vitals:   06/26/20 0013 06/26/20 0406  BP: (!) 147/81 116/69  Pulse:  80  Resp: 19 16  Temp: 97.8 F (36.6 C) 97.8 F (36.6 C)  SpO2: 98% 98%    Physical Exam: Cardiac:  regular Lungs:  Non labored Extremities:  Easily palpable DP pulses bilaterally Abdomen:  Soft, NT/ND  CBC    Component Value Date/Time   WBC 7.4 06/25/2020 0045   RBC 3.17 (L) 06/25/2020 0045   HGB 10.2 (L) 06/25/2020 0045   HGB 14.8 08/10/2011 0555   HCT 29.6 (L) 06/25/2020 0045   HCT 44.3 08/10/2011 0555   PLT 160 06/25/2020 0045   PLT 183 08/10/2011 0555   MCV 93.4 06/25/2020 0045   MCV 94 08/10/2011 0555   MCH 32.2 06/25/2020 0045   MCHC 34.5 06/25/2020 0045   RDW 13.0 06/25/2020 0045   RDW 13.3 08/10/2011 0555   LYMPHSABS 0.6 (L) 06/15/2018 1722   LYMPHSABS 1.8 08/10/2011 0555   MONOABS 0.5 06/15/2018 1722   MONOABS 0.5 08/10/2011 0555   EOSABS 0.0 06/15/2018 1722   EOSABS 0.1 08/10/2011 0555   BASOSABS 0.0 06/15/2018 1722   BASOSABS 0.0 08/10/2011 0555    BMET    Component Value Date/Time   NA 134 (L) 06/25/2020 0045   NA 136 08/10/2011 0555   K 3.8 06/25/2020 0045   K 4.6 08/10/2011 0555   CL 100 06/25/2020 0045   CL 99 08/10/2011 0555   CO2 27 06/25/2020 0045   CO2 32 08/10/2011 0555   GLUCOSE 88 06/25/2020 0045   GLUCOSE 81 08/10/2011 0555   BUN 8 06/25/2020 0045   BUN 14 08/10/2011 0555   CREATININE 0.91 06/25/2020 0045   CREATININE 1.17 08/10/2011 0555   CALCIUM 8.5 (L) 06/25/2020 0045   CALCIUM 8.7 08/10/2011 0555   GFRNONAA >60 06/25/2020 0045   GFRNONAA >60 08/10/2011 0555   GFRAA >60 06/15/2018 1603   GFRAA >60 08/10/2011 0555    INR    Component Value Date/Time   INR 1.2 06/23/2020 0529    No intake or output data  in the 24 hours ending 06/26/20 0707   Assessment:  80 y.o. male is s/p:  EVAR / REIA stenting for symptomatic aortic aneurysm. Complicated by urethral injury during preoperative foley placement  4 Days Post-Op  Plan: -pt with easily palpable DP pulses bilaterally -abdominal pain much improved -pt does not want to go back to current residence.  TOC is following for SNF. -SPT and foley in place.  Pt to be discharged with these and seen in urology office in 3-4 weeks for voiding trial.  Pt with recurrent prostate cancer.  Per urology, consider androgen deprivation in the future pending his wishes, he is NOT candidate for any sort of salvage surgery.  -DVT prophylaxis:  Sq heparin -continue to mobilize OOB and work with PT/OT   Leontine Locket, PA-C [facility] [phone] 06/26/2020 7:07 AM  VASCULAR STAFF ADDENDUM: I have independently interviewed and examined the patient. I agree with the above.  Doing OK.  Limited mobility. Pain well controlled. Groins soft. Incisions healthy. SP/Foley in place. Palpable femoral pulses. Plan dispo to SNF.  [name]. Stanford Breed, MD [facility]  Office Phone Number: [phone] 06/26/2020 5:24 PM

## 2020-06-26 NOTE — NC FL2 (Signed)
[place] MEDICAID FL2 LEVEL OF CARE SCREENING TOOL     IDENTIFICATION  Patient Name: James Dickerson Birthdate: 03/05/1941 Sex: male Admission Date (Current Location): 06/22/2020  [facility] and [place] Number:  Herbalist and Address:  [facility], [place] [address], [place], [place] [address]      Provider Number: [id number]  Attending Physician Name and Address:  Cherre Robins, MD  Relative Name and Phone Number:       Current Level of Care: Hospital Recommended Level of Care: [place] Prior Approval Number:    Date Approved/Denied:   PASRR Number:    Discharge Plan: SNF    Current Diagnoses: Patient Active Problem List   Diagnosis Date Noted  . AAA (abdominal aortic aneurysm) ([place]) 06/23/2020  . Status post surgery 06/22/2020  . Thoracic aortic aneurysm without rupture ([place]) 12/18/2019  . Respiratory failure ([place]) 07/11/2017  . Acute respiratory failure ([place]) 07/10/2017  . CAP (community acquired pneumonia) 07/10/2017  . Influenza A 07/10/2017  . COPD exacerbation ([place]) 07/10/2017  . HLD (hyperlipidemia) 03/09/2017  . Essential hypertension 08/14/2016  . 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 Bradycardia 08/05/2016    Orientation RESPIRATION BLADDER Height & Weight     Self,Time,Situation,Place  Normal Continent (urethal cath &  suprapubic cath) Weight: 110 lb 3.7 oz (50 kg) (via bed) Height:  5' 2.99" (160 cm)  BEHAVIORAL SYMPTOMS/MOOD NEUROLOGICAL BOWEL NUTRITION STATUS      Continent Diet (please see discharge summary)  AMBULATORY STATUS COMMUNICATION OF NEEDS Skin   Supervision Verbally Surgical wounds (closed incision LFT & RT Groin)                       Personal Care Assistance Level of Assistance  Bathing,Feeding,Dressing Bathing Assistance: Limited assistance Feeding assistance: Independent Dressing Assistance: Limited assistance     Functional Limitations Info   Sight,Hearing,Speech Sight Info: Adequate Hearing Info: Adequate Speech Info: Adequate    SPECIAL CARE FACTORS FREQUENCY  PT (By licensed PT),OT (By licensed OT)     PT Frequency: 5x per week OT Frequency: 5x per week            Contractures Contractures Info: Not present    Additional Factors Info  Code Status,Allergies Code Status Info: FULL Allergies Info: NKA           Current Medications (06/26/2020):  This is the current hospital active medication list Current Facility-Administered Medications  Medication Dose Route Frequency Provider Last Rate Last Admin  . 0.9 %  sodium chloride infusion  500 mL Intravenous Once PRN Rhyne, Samantha J, PA-C      . 0.9 %  sodium chloride infusion   Intravenous Continuous Rhyne, Hulen Shouts, PA-C   Stopped at 06/23/20 1356  . acetaminophen (TYLENOL) tablet 650 mg  650 mg Oral Q6H Cherre Robins, MD   650 mg at 06/26/20 1324  . alum & mag hydroxide-simeth (MAALOX/MYLANTA) 200-200-20 MG/5ML suspension 15-30 mL  15-30 mL Oral Q2H PRN Rhyne, Samantha J, PA-C      . amLODipine (NORVASC) tablet 5 mg  5 mg Oral q morning - 10a Rhyne, Samantha J, PA-C   5 mg at 06/26/20 0354  . aspirin EC tablet 81 mg  81 mg Oral Q0600 Gabriel Earing, PA-C   81 mg at 06/26/20 6568  . atorvastatin (LIPITOR) tablet 40 mg  40 mg Oral QHS Rhyne, Samantha J, PA-C   40 mg  at 06/25/20 2107  . bisacodyl (DULCOLAX) suppository 10 mg  10 mg Rectal Daily PRN Rhyne, Hulen Shouts, PA-C      . Chlorhexidine Gluconate Cloth 2 % PADS 6 each  6 each Topical Daily Cherre Robins, MD   6 each at 06/26/20 [phone]  . cholecalciferol (VITAMIN D3) tablet 2,000 Units  2,000 Units Oral Daily Gabriel Earing, PA-C   2,000 Units at 06/26/20 9604  . docusate sodium (COLACE) capsule 100 mg  100 mg Oral Daily Rhyne, Samantha J, PA-C   100 mg at 06/26/20 5409  . donepezil (ARICEPT) tablet 10 mg  10 mg Oral QHS Rhyne, Samantha J, PA-C   10 mg at 06/25/20 2107  .  guaiFENesin-dextromethorphan (ROBITUSSIN DM) 100-10 MG/5ML syrup 15 mL  15 mL Oral Q4H PRN Rhyne, Samantha J, PA-C      . heparin injection 5,000 Units  5,000 Units Subcutaneous Q8H Rhyne, Samantha J, PA-C   5,000 Units at 06/26/20 1324  . hydrALAZINE (APRESOLINE) injection 5 mg  5 mg Intravenous Q20 Min PRN Gabriel Earing, PA-C   5 mg at 06/23/20 0537  . ipratropium-albuterol (DUONEB) 0.5-2.5 (3) MG/3ML nebulizer solution 3 mL  3 mL Nebulization Q6H PRN Wynetta Fines T, MD      . lactated ringers infusion   Intravenous Continuous Cherre Robins, MD 100 mL/hr at 06/26/20 1555 New Bag at 06/26/20 1555  . magnesium sulfate IVPB 2 g 50 mL  2 g Intravenous Daily PRN Rhyne, Samantha J, PA-C      . MEDLINE mouth rinse  15 mL Mouth Rinse BID Cherre Robins, MD   15 mL at 06/26/20 0939  . metoprolol tartrate (LOPRESSOR) injection 2-5 mg  2-5 mg Intravenous Q2H PRN Rhyne, Samantha J, PA-C      . mometasone-formoterol (DULERA) 200-5 MCG/ACT inhaler 2 puff  2 puff Inhalation BID Gabriel Earing, PA-C   2 puff at 06/26/20 0816  . morphine 4 MG/ML injection 4 mg  4 mg Intravenous Q4H PRN Cherre Robins, MD      . ondansetron [facility]) injection 4 mg  4 mg Intravenous Q6H PRN Rhyne, Samantha J, PA-C      . oxyCODONE (Oxy IR/ROXICODONE) immediate release tablet 5-10 mg  5-10 mg Oral Q4H PRN Cherre Robins, MD   5 mg at 06/24/20 1314  . pantoprazole (PROTONIX) EC tablet 40 mg  40 mg Oral BID Gabriel Earing, PA-C   40 mg at 06/26/20 8119  . phenol (CHLORASEPTIC) mouth spray 1 spray  1 spray Mouth/Throat PRN Rhyne, Samantha J, PA-C      . polyethylene glycol (MIRALAX / GLYCOLAX) packet 17 g  17 g Oral Daily PRN Rhyne, Samantha J, PA-C      . potassium chloride SA (KLOR-CON) CR tablet 20-40 mEq  20-40 mEq Oral Daily PRN Rhyne, Samantha J, PA-C      . tamsulosin (FLOMAX) capsule 0.4 mg  0.4 mg Oral Daily Rhyne, Samantha J, PA-C   0.4 mg at 06/26/20 1478     Discharge Medications: Please see discharge  summary for a list of discharge medications.  Relevant Imaging Results:  Relevant Lab Results:   Additional Information SSN [id number]  received covid vaccines  Vinie Sill, LCSWA

## 2020-06-27 ENCOUNTER — Other Ambulatory Visit: Payer: Self-pay

## 2020-06-27 DIAGNOSIS — I714 Abdominal aortic aneurysm, without rupture, unspecified: Secondary | ICD-10-CM

## 2020-06-27 NOTE — Progress Notes (Addendum)
  Progress Note    06/27/2020 7:42 AM 5 Days Post-Op  Subjective:  States he has a headache this morning. Denies any abdominal pain or lower extremity pain   Vitals:   06/27/20 0008 06/27/20 0538  BP: 119/68 (!) 148/86  Pulse: 89 75  Resp: 18 18  Temp: 98.3 F (36.8 C) 98.4 F (36.9 C)  SpO2: 96% 97%   Physical Exam: Cardiac:  regular Lungs: non labored Incisions: bilateral groin incisions clean, dry and intact without hematoma Extremities: well perfused and warm. Easily palpable DP pulses bilaterally. Motor and sensation intact Abdomen: flat, soft, non tender. SP catheter dressings in place. Clean dry and intact Neurologic:alert and oriented  CBC    Component Value Date/Time   WBC 7.4 06/25/2020 0045   RBC 3.17 (L) 06/25/2020 0045   HGB 10.2 (L) 06/25/2020 0045   HGB 14.8 08/10/2011 0555   HCT 29.6 (L) 06/25/2020 0045   HCT 44.3 08/10/2011 0555   PLT 160 06/25/2020 0045   PLT 183 08/10/2011 0555   MCV 93.4 06/25/2020 0045   MCV 94 08/10/2011 0555   MCH 32.2 06/25/2020 0045   MCHC 34.5 06/25/2020 0045   RDW 13.0 06/25/2020 0045   RDW 13.3 08/10/2011 0555   LYMPHSABS 0.6 (L) 06/15/2018 1722   LYMPHSABS 1.8 08/10/2011 0555   MONOABS 0.5 06/15/2018 1722   MONOABS 0.5 08/10/2011 0555   EOSABS 0.0 06/15/2018 1722   EOSABS 0.1 08/10/2011 0555   BASOSABS 0.0 06/15/2018 1722   BASOSABS 0.0 08/10/2011 0555    BMET    Component Value Date/Time   NA 134 (L) 06/25/2020 0045   NA 136 08/10/2011 0555   K 3.8 06/25/2020 0045   K 4.6 08/10/2011 0555   CL 100 06/25/2020 0045   CL 99 08/10/2011 0555   CO2 27 06/25/2020 0045   CO2 32 08/10/2011 0555   GLUCOSE 88 06/25/2020 0045   GLUCOSE 81 08/10/2011 0555   BUN 8 06/25/2020 0045   BUN 14 08/10/2011 0555   CREATININE 0.91 06/25/2020 0045   CREATININE 1.17 08/10/2011 0555   CALCIUM 8.5 (L) 06/25/2020 0045   CALCIUM 8.7 08/10/2011 0555   GFRNONAA >60 06/25/2020 0045   GFRNONAA >60 08/10/2011 0555   GFRAA >60  06/15/2018 1603   GFRAA >60 08/10/2011 0555    INR    Component Value Date/Time   INR 1.2 06/23/2020 0529     Intake/Output Summary (Last 24 hours) at 06/27/2020 0742 Last data filed at 06/27/2020 9326 Gross per 24 hour  Intake 860 ml  Output 2750 ml  Net -1890 ml     Assessment/Plan:  80 y.o. male is s/p EVAR / REIA stenting for symptomatic aortic aneurysm. Complicated by urethral injury during preoperative foley placement  5 Days Post-Op. Doing well post op. Pain well controlled. Lower extremities well perfused and warm with palpable Dp pulses bilaterally. No abdominal pain. Tolerating diet. Good UOP via SP cath. Continue to mobilize/ oob. Pending SNF placement  DVT prophylaxis: sq Hep   Karoline Caldwell, PA-C [facility] [phone] 06/27/2020 7:42 AM   VASCULAR STAFF ADDENDUM: I agree with the above.  Sleeping comfortably.  No change in care plan. Pending transfer to SNF.  [name]. Stanford Breed, MD [facility] Phone Number: [phone] 06/27/2020 10:10 AM

## 2020-06-27 NOTE — Care Management Important Message (Signed)
Important Message  Patient Details  Name: James Dickerson MRN: [id number] Date of Birth: 11-25-1940   Medicare Important Message Given:  Yes     Shelda Altes 06/27/2020, 12:15 PM

## 2020-06-27 NOTE — Progress Notes (Signed)
Physical Therapy Treatment Patient Details Name: James Dickerson MRN: [id number] DOB: [birth year] Today's Date: 06/27/2020    History of Present Illness He presented to [facility] reporting a day of right lower quadrant/iliac crest pain and mid lumbar pain.  He reports he has had nausea with poor appetite over the past several months. 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 1/30 pt underwent bilateral open femoral artery exposure and direct repair; endovascular aortic aneurysm repair; right external iliac artery angioplasty and stenting; and cystoscopy, suprapubic catheter and foley catheter placement.    PT Comments    Pt received in supine, agreeable to therapy session and with good participation and tolerance for mobility. Pt quick to fatigue this session, orthostatics taken but pt BP stable (BP 122/89 seated, BP 118/88 standing and pt not dizzy). He was able to perform gait trials x3 with RW (up to 30ft) and min guard/occasional minA and needs up to min guard for transfers. Pt performed supine/seated BLE exercises and HEP handout given to complete TID as able. Pt reporting 8/10 RPE (fatigue) at end of session and refusing to sit up in chair, student RN/mobility tech notified pt would benefit from [place] later in day if agreeable. Pt up to bathroom at end of session, extensively reviewed use of bathroom call bell and staff waiting outside door to assist however pt got up without pulling bell, pt again instructed on safety and bed alarm activated at end of session due to pt decreased safety awareness/insight. Pt continues to benefit from PT services to progress toward functional mobility goals.  Continue to recommend SNF.  Follow Up Recommendations  SNF     Equipment Recommendations  Rolling walker with 5" wheels    Recommendations for Other Services       Precautions / Restrictions Precautions Precautions: Fall Precaution Comments: Lower abodominal incision and  suprapubic catheter Restrictions Weight Bearing Restrictions: No    Mobility  Bed Mobility Overal bed mobility: Needs Assistance Bed Mobility: Sidelying to Sit   Sidelying to sit: HOB elevated;Supervision       General bed mobility comments: to R side, cues for safety, use of bed features  Transfers Overall transfer level: Needs assistance Equipment used: Rolling walker (2 wheeled) Transfers: Sit to/from Stand Sit to Stand: Min guard         General transfer comment: cues for hand placement; x3 reps from EOB and x1 from toilet  Ambulation/Gait Ambulation/Gait assistance: Min guard;Min assist Gait Distance (Feet): 30 Feet (81ft x2 with seated break, then 25ft to toilet) Assistive device: Rolling walker (2 wheeled) Gait Pattern/deviations: Step-through pattern;Decreased stride length;Trunk flexed Gait velocity: decreased; grossly <0.3 m/s   General Gait Details: Max cues for walker proximity, upward gaze, manual assist provided intermittently for walker negotiation.   Stairs             Wheelchair Mobility    Modified Rankin (Stroke Patients Only)       Balance Overall balance assessment: Needs assistance Sitting-balance support: No upper extremity supported;Feet supported Sitting balance-Leahy Scale: Good     Standing balance support: Bilateral upper extremity supported Standing balance-Leahy Scale: Poor Standing balance comment: reliant on RW and some external assist for safety                            Cognition Arousal/Alertness: Awake/alert Behavior During Therapy: WFL for tasks assessed/performed Overall Cognitive Status: Within Functional Limits for tasks assessed  Exercises General Exercises - Lower Extremity Ankle Circles/Pumps: AROM;Both;10 reps;Supine Quad Sets: AROM;Both;10 reps;Supine Gluteal Sets: AROM;Both;10 reps;Supine Long Arc Quad: AROM;Both;10 reps;Seated Hip  Flexion/Marching: AROM;Both;10 reps;Supine    General Comments General comments (skin integrity, edema, etc.): foley cath; pt assisted to toilet for BM, pt reports he will pull call bell when done but pt got up without pulling bell (after being checked on and was not ready yet and staff standing outside of room waiting for him to pull it), given extensive instruction on importance of pulling bell for safety and assist with IV line as he was very fatigued prior to getting to bathroom and at higher risk of falls due to debility; recommend standing next to bathroom door next time      Pertinent Vitals/Pain Pain Assessment: Faces Faces Pain Scale: Hurts a little bit Pain Location: incisional when he moves and HA Pain Descriptors / Indicators: Sore;Headache Pain Intervention(s): Monitored during session;Repositioned    Home Living                      Prior Function            PT Goals (current goals can now be found in the care plan section) Acute Rehab PT Goals Patient Stated Goal: to get stronger--agreeable to rehab before home PT Goal Formulation: With patient Time For Goal Achievement: 07/09/20 Potential to Achieve Goals: Good Progress towards PT goals: Progressing toward goals    Frequency    Min 3X/week      PT Plan Current plan remains appropriate    Co-evaluation              AM-PAC PT "6 Clicks" Mobility   Outcome Measure  Help needed turning from your back to your side while in a flat bed without using bedrails?: None Help needed moving from lying on your back to sitting on the side of a flat bed without using bedrails?: A Little Help needed moving to and from a bed to a chair (including a wheelchair)?: A Little Help needed standing up from a chair using your arms (e.g., wheelchair or bedside chair)?: A Little Help needed to walk in hospital room?: A Little Help needed climbing 3-5 steps with a railing? : A Lot 6 Click Score: 18    End of  Session Equipment Utilized During Treatment: Gait belt Activity Tolerance: Patient tolerated treatment well Patient left: with call bell/phone within reach;in bed;with bed alarm set Nurse Communication: Mobility status PT Visit Diagnosis: Unsteadiness on feet (R26.81);Muscle weakness (generalized) (M62.81);Difficulty in walking, not elsewhere classified (R26.2);Pain Pain - part of body:  (abdomen)     Time: 2297-9892 PT Time Calculation (min) (ACUTE ONLY): 23 min  Charges:  $Gait Training: 8-22 mins $Therapeutic Exercise: 8-22 mins                     Kendall Arnell P., PTA Acute Rehabilitation Services Pager: [phone] Office: [place] 06/27/2020, 11:05 AM

## 2020-06-27 NOTE — TOC Progression Note (Signed)
Transition of Care [facility]) - Progression Note    Patient Details  Name: James Dickerson MRN: [id number] Date of Birth: 1940-06-11  Transition of Care [facility]) CM/SW [place], [place] Phone Number: 06/27/2020, 5:56 PM  Clinical Narrative:     CSW spoke with patient's daughter,Sissy. CSW introduced self and explained role. CSW informed of therapy recommendations and provided bed offers. She had decided on [facility] and [facility]. She advised patient will discharge to group home after he receives rehab. She states patient can no longer live home alone.  CSW inquired about the name of group home- she states she will call back tomorrow with the name of the home.   CSW visit with the patient at bedside. CSW advised- talked with his daughter and she has selected [facility] and [facility] for SNF placement. Patient states understanding. CSW advised his daughter states she has secured a place to go after rehab. Patient requested CSW to have his daughter to call him. CSW called his daughter while in the room and gave her the message. Family and patient expressed appreciations of CSW assistance. No questions or concerns noted at this time.    CSW contacted [facility] to confirm bed offer  - waiting on response  CSW will continue to follow and assist with discharge planning.  Thurmond Butts, MSW, LCSW Clinical Social Worker     Expected Discharge Plan: Skilled Nursing Facility Barriers to Discharge: SNF Pending bed offer,Continued Medical Work up,Insurance Authorization  Expected Discharge Plan and Services Expected Discharge Plan: [place] In-house Referral: Clinical Social Work     Living arrangements for the past 2 months: Apartment                                       Social Determinants of Health (SDOH) Interventions    Readmission Risk Interventions No flowsheet data found.

## 2020-06-27 NOTE — Progress Notes (Signed)
Mobility Specialist - Progress Note   06/27/20 1320  Mobility  Activity Ambulated to bathroom  Level of Assistance Standby assist, set-up cues, supervision of patient - no hands on  Assistive Device Front wheel walker  Distance Ambulated (ft) 25 ft  Mobility Response Tolerated well  Mobility performed by Mobility specialist  $Mobility charge 1 Mobility   Pre-mobility: 95 HR During mobility: 110 HR Post-mobility: 79 HR  Pt received w/ bed alarm sounding as pt was attempting to get up to pass an urgent BM. He was assisted onto the toilet and passes a loose stool. Pt then back to bed. Asx throughout.   Pricilla Handler Mobility Specialist Mobility Specialist Phone: [phone]

## 2020-06-27 NOTE — Plan of Care (Signed)
  Problem: Clinical Measurements: Goal: Will remain free from infection Outcome: Progressing Goal: Respiratory complications will improve Outcome: Progressing Goal: Cardiovascular complication will be avoided Outcome: Progressing   

## 2020-06-28 LAB — SARS CORONAVIRUS 2 (TAT 6-24 HRS): SARS Coronavirus 2: NEGATIVE

## 2020-06-28 NOTE — Progress Notes (Signed)
Physical Therapy Treatment Patient Details Name: James Dickerson MRN: [id number] DOB: [birth year] Today's Date: 06/28/2020    History of Present Illness He presented to [facility] ER reporting a day of right lower quadrant/iliac crest pain and mid lumbar pain.  He reports he has had nausea with poor appetite over the past several months. 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 1/30 pt underwent bilateral open femoral artery exposure and direct repair; endovascular aortic aneurysm repair; right external iliac artery angioplasty and stenting; and cystoscopy, suprapubic catheter and foley catheter placement.    PT Comments    Pt received in supine, drowsy but agreeable to therapy session with maximal encouragement. Pt performed bed mobility and transfers with increased time and Supervision to min guard, and gait using RW with min guard. Pt with decreased gait speed and heavily reliant on RW for support, indicating increased risk of falls for household ambulation tasks, bed alarm set for safety due to moderate fall risk. Will plan to progress gait distance next session with chair follow for safety. Pt continues to benefit from PT services to progress toward functional mobility goals. Continue to recommend SNF.   Follow Up Recommendations  SNF     Equipment Recommendations  Rolling walker with 5" wheels    Recommendations for Other Services       Precautions / Restrictions Precautions Precautions: Fall Precaution Comments: Lower abodominal incision and suprapubic catheter Restrictions Weight Bearing Restrictions: No    Mobility  Bed Mobility Overal bed mobility: Needs Assistance Bed Mobility: Sidelying to Sit;Sit to Sidelying   Sidelying to sit: Supervision     Sit to sidelying: Supervision General bed mobility comments: pt sidelying to L on staff arrival, performed bed mobility with increased time, cues for encouragement/log rolling technique  Transfers Overall  transfer level: Needs assistance Equipment used: Rolling walker (2 wheeled) Transfers: Sit to/from Stand Sit to Stand: Min guard         General transfer comment: cues for hand placement with poor carryover; from EOB x1 rep, refusing further transfers or OOB  Ambulation/Gait Ambulation/Gait assistance: Min guard Gait Distance (Feet): 48 Feet (48ft x2 with standing break at RW) Assistive device: Rolling walker (2 wheeled) Gait Pattern/deviations: Step-through pattern;Decreased stride length;Trunk flexed Gait velocity: decreased; grossly <0.3 m/s   General Gait Details: cues for RW proximity, upward gaze, pt resting forearms on RW when fatigued and cued for safer/upright posture   Stairs             Wheelchair Mobility    Modified Rankin (Stroke Patients Only)       Balance Overall balance assessment: Needs assistance Sitting-balance support: No upper extremity supported;Feet supported Sitting balance-Leahy Scale: Good     Standing balance support: Bilateral upper extremity supported Standing balance-Leahy Scale: Poor Standing balance comment: heavily reliant on RW and min guard for safety                            Cognition Arousal/Alertness: Awake/alert (drowsy but easily awoken) Behavior During Therapy: WFL for tasks assessed/performed Overall Cognitive Status: Within Functional Limits for tasks assessed                                 General Comments: needs heavy encouragement, pt hesitant to mobilize, reports feeling unwell but unable to localize area of discomfort/denies nausea or severe pain      Exercises Other Exercises  Other Exercises: verbal encouragement/review of LE HEP per handout in room, pt not receptive to instruction    General Comments General comments (skin integrity, edema, etc.): foley cath with some pinkish color; HR 70 bpm resting, 94 bpm seated EOB and 112 bpm standing; BP 124/90 supine BP 126/94 seated EOB  after gait trial      Pertinent Vitals/Pain Pain Assessment: 0-10 Faces Pain Scale: Hurts little more Pain Location: incisional when he moves Pain Descriptors / Indicators: Sore;Grimacing;Guarding Pain Intervention(s): Monitored during session;Repositioned (meds taken once seated EOB -meds were already in room, unsure if pain meds or other)    Home Living                      Prior Function            PT Goals (current goals can now be found in the care plan section) Acute Rehab PT Goals Patient Stated Goal: to get stronger--agreeable to rehab before home PT Goal Formulation: With patient Time For Goal Achievement: 07/09/20 Potential to Achieve Goals: Good Progress towards PT goals: Progressing toward goals    Frequency    Min 3X/week      PT Plan Current plan remains appropriate    Co-evaluation              AM-PAC PT "6 Clicks" Mobility   Outcome Measure  Help needed turning from your back to your side while in a flat bed without using bedrails?: None Help needed moving from lying on your back to sitting on the side of a flat bed without using bedrails?: A Little Help needed moving to and from a bed to a chair (including a wheelchair)?: A Little Help needed standing up from a chair using your arms (e.g., wheelchair or bedside chair)?: A Little Help needed to walk in hospital room?: A Little Help needed climbing 3-5 steps with a railing? : A Lot 6 Click Score: 18    End of Session Equipment Utilized During Treatment: Gait belt Activity Tolerance: Patient limited by fatigue Patient left: with call bell/phone within reach;in bed;with bed alarm set Nurse Communication: Mobility status PT Visit Diagnosis: Unsteadiness on feet (R26.81);Muscle weakness (generalized) (M62.81);Difficulty in walking, not elsewhere classified (R26.2);Pain Pain - part of body:  (abdomen)     Time: 9379-0240 PT Time Calculation (min) (ACUTE ONLY): 21 min  Charges:   $Gait Training: 8-22 mins                     Madysun Thall P., PTA Acute Rehabilitation Services Pager: [phone] Office: [place] 06/28/2020, 9:49 AM

## 2020-06-28 NOTE — Progress Notes (Addendum)
Progress Note    06/28/2020 7:08 AM 6 Days Post-Op  Subjective:  He denies pain, SOB, N or V   Vitals:   06/28/20 0000 06/28/20 0400  BP: 136/74 130/82  Pulse:  78  Resp: 19 20  Temp: 98.2 F (36.8 C) 98.8 F (37.1 C)  SpO2: 100% 97%    Physical Exam: Cardiac:  regular Lungs: non labored Incisions: bilateral groin incisions clean, dry and intact without hematoma Extremities: well perfused and warm. Easily palpable DP pulses bilaterally. Motor and sensation intact Abdomen: flat, soft, non tender. SP catheter dressings in place. Clean dry and intact Neurologic:alert and oriented  Excellent UOP CBC    Component Value Date/Time   WBC 7.4 06/25/2020 0045   RBC 3.17 (L) 06/25/2020 0045   HGB 10.2 (L) 06/25/2020 0045   HGB 14.8 08/10/2011 0555   HCT 29.6 (L) 06/25/2020 0045   HCT 44.3 08/10/2011 0555   PLT 160 06/25/2020 0045   PLT 183 08/10/2011 0555   MCV 93.4 06/25/2020 0045   MCV 94 08/10/2011 0555   MCH 32.2 06/25/2020 0045   MCHC 34.5 06/25/2020 0045   RDW 13.0 06/25/2020 0045   RDW 13.3 08/10/2011 0555   LYMPHSABS 0.6 (L) 06/15/2018 1722   LYMPHSABS 1.8 08/10/2011 0555   MONOABS 0.5 06/15/2018 1722   MONOABS 0.5 08/10/2011 0555   EOSABS 0.0 06/15/2018 1722   EOSABS 0.1 08/10/2011 0555   BASOSABS 0.0 06/15/2018 1722   BASOSABS 0.0 08/10/2011 0555    BMET    Component Value Date/Time   NA 134 (L) 06/25/2020 0045   NA 136 08/10/2011 0555   K 3.8 06/25/2020 0045   K 4.6 08/10/2011 0555   CL 100 06/25/2020 0045   CL 99 08/10/2011 0555   CO2 27 06/25/2020 0045   CO2 32 08/10/2011 0555   GLUCOSE 88 06/25/2020 0045   GLUCOSE 81 08/10/2011 0555   BUN 8 06/25/2020 0045   BUN 14 08/10/2011 0555   CREATININE 0.91 06/25/2020 0045   CREATININE 1.17 08/10/2011 0555   CALCIUM 8.5 (L) 06/25/2020 0045   CALCIUM 8.7 08/10/2011 0555   GFRNONAA >60 06/25/2020 0045   GFRNONAA >60 08/10/2011 0555   GFRAA >60 06/15/2018 1603   GFRAA >60 08/10/2011 0555      Intake/Output Summary (Last 24 hours) at 06/28/2020 0708 Last data filed at 06/28/2020 0000 Gross per 24 hour  Intake 1586.91 ml  Output 2225 ml  Net -638.09 ml    HOSPITAL MEDICATIONS Scheduled Meds: . acetaminophen  650 mg Oral Q6H  . amLODipine  5 mg Oral q morning - 10a  . aspirin EC  81 mg Oral Q0600  . atorvastatin  40 mg Oral QHS  . Chlorhexidine Gluconate Cloth  6 each Topical Daily  . cholecalciferol  2,000 Units Oral Daily  . docusate sodium  100 mg Oral Daily  . donepezil  10 mg Oral QHS  . heparin  5,000 Units Subcutaneous Q8H  . mouth rinse  15 mL Mouth Rinse BID  . mometasone-formoterol  2 puff Inhalation BID  . pantoprazole  40 mg Oral BID  . tamsulosin  0.4 mg Oral Daily   Continuous Infusions: . sodium chloride    . sodium chloride Stopped (06/27/20 1021)  . lactated ringers 100 mL/hr at 06/27/20 0214  . magnesium sulfate bolus IVPB     PRN Meds:.sodium chloride, alum & mag hydroxide-simeth, bisacodyl, guaiFENesin-dextromethorphan, hydrALAZINE, ipratropium-albuterol, magnesium sulfate bolus IVPB, metoprolol tartrate, morphine injection, ondansetron, oxyCODONE, phenol, polyethylene glycol, potassium  chloride  Assessment and Plan: POD 6 80 y.o. male is s/p EVAR / REIA stenting for symptomatic aortic aneurysm. Complicated by urethral injury during preoperative foley placement.  Doing well post op. Pain well controlled. Lower extremities well perfused and warm with palpable DP pulses bilaterally. Stable pulses. No abdominal pain. Poor appetite.Tolerating diet. Good UOP via SP cath. Continue to mobilize/ oob. Pending SNF placement.  Continue asa and statin.  CSW contacted [facility] to confirm bed offer  - waiting on response at 6pm yesterday CSW will continue to follow and assist with discharge planning.  -DVT prophylaxis: heparin [place]   Risa Grill, PA-C [facility] [phone] 06/28/2020  7:08 AM   VASCULAR STAFF ADDENDUM: I have  independently interviewed and examined the patient. I agree with the above.  Medically ready for next level of care.  [name]. Stanford Breed, MD [facility] Phone Number: [phone] 06/28/2020 2:50 PM

## 2020-06-28 NOTE — Discharge Summary (Signed)
EVAR Discharge Summary   James Dickerson [birth year] 80 y.o. male  MRN: [id number]  Admission Date: 06/22/2020  Discharge Date: 06/29/2020  Physician: Cherre Robins, MD  Admission Diagnosis: Status post surgery [Z98.890] AAA (abdominal aortic aneurysm) [facility]) [I71.4]  Hospital Course:  The patient was admitted to the hospital and taken to the operating room on 06/22/2020 and underwent: Abdominal aortic endovascular stent graft, insertion of right external iliac artery stent. Procedure complicated by urethral injury during placement of foley. This required Insertion of suprapubic catheter and cystoscopy with urethral dilatation by Urologist Dr. Alexis Frock.  The pt tolerated the procedure well and was transported to the PACU in good condition.   POD#1 patient did well overnight. Lower extremities well perfused and warm. Bilateral femoral groin access sites clean, dry and intact without hematoma. Abdominal pain resolved. No other complaints. Stable for transfer to the floor. Encouraged to mobilize as tolerated. Diet as tolerated. Foley to remain in place. Hemodynamically stable. Vitals remained stable. Afebrile  POD#2 continued to do well. No evidence of malperfusion. Bilateral lower extremities well perfused and warm. Tolerated his mobility session poorly secondary to abdominal pain. Pain control. Encouraged mobilization. Good urine output. Normal renal function. Otherwise remained stable  POD#3 abdominal pain improved. Lower extremities well perfused. Hemodynamically stable. Encouraged mobilization. Suprapubic catheter management per Urology. Tolerated mobilizing with mobility specialist. OT and PT evaluated with recommendation for SNF. TOC consulted to assist with placement as patient did not want to return to prior housing.   POD#4 Pain better controlled. Lower extremities with adequate perfusion. Continued good urine output. Mobilizing better. Tolerating diet. The remainder of the  hospital course consisted of increasing mobilization and increasing intake of solids without difficulty. Patient stable from vascular standpoint for discharge. TOC contacted [facility] for acceptance awaiting bed  POD#6 patient continues to do well. Pain stable and manageable. Able to tolerate mobilizing with PT. Lower extremities remain well perfused. He is otherwise stable for discharge to SNF He will be discharge on Aspirin and Statin. PDMP was reviewed an post operative pain medication prescription will be sent with patient at time of discharge. He has follow up arranged in 4 weeks with Dr. Stanford Breed with CTA chest, abdomen and pelvis. Follow up with Urology in 3-4 weeks for voiding trial  CBC    Component Value Date/Time   WBC 7.4 06/25/2020 0045   RBC 3.17 (L) 06/25/2020 0045   HGB 10.2 (L) 06/25/2020 0045   HGB 14.8 08/10/2011 0555   HCT 29.6 (L) 06/25/2020 0045   HCT 44.3 08/10/2011 0555   PLT 160 06/25/2020 0045   PLT 183 08/10/2011 0555   MCV 93.4 06/25/2020 0045   MCV 94 08/10/2011 0555   MCH 32.2 06/25/2020 0045   MCHC 34.5 06/25/2020 0045   RDW 13.0 06/25/2020 0045   RDW 13.3 08/10/2011 0555   LYMPHSABS 0.6 (L) 06/15/2018 1722   LYMPHSABS 1.8 08/10/2011 0555   MONOABS 0.5 06/15/2018 1722   MONOABS 0.5 08/10/2011 0555   EOSABS 0.0 06/15/2018 1722   EOSABS 0.1 08/10/2011 0555   BASOSABS 0.0 06/15/2018 1722   BASOSABS 0.0 08/10/2011 0555    BMET    Component Value Date/Time   NA 134 (L) 06/25/2020 0045   NA 136 08/10/2011 0555   K 3.8 06/25/2020 0045   K 4.6 08/10/2011 0555   CL 100 06/25/2020 0045   CL 99 08/10/2011 0555   CO2 27 06/25/2020 0045   CO2 32 08/10/2011 0555   GLUCOSE 88 06/25/2020 0045  GLUCOSE 81 08/10/2011 0555   BUN 8 06/25/2020 0045   BUN 14 08/10/2011 0555   CREATININE 0.91 06/25/2020 0045   CREATININE 1.17 08/10/2011 0555   CALCIUM 8.5 (L) 06/25/2020 0045   CALCIUM 8.7 08/10/2011 0555   GFRNONAA >60 06/25/2020 0045   GFRNONAA >60  08/10/2011 0555   GFRAA >60 06/15/2018 1603   GFRAA >60 08/10/2011 0555       Discharge Instructions    Discharge patient   Complete by: As directed    Discharge disposition: 03-Skilled [place]   Discharge patient date: 06/29/2020      Discharge Diagnosis:  Status post surgery [Z98.890] AAA (abdominal aortic aneurysm) ([place]) [I71.4]  Secondary Diagnosis: Patient Active Problem List   Diagnosis Date Noted  . AAA (abdominal aortic aneurysm) ([place]) 06/23/2020  . Status post surgery 06/22/2020  . Thoracic aortic aneurysm without rupture ([place]) 12/18/2019  . Respiratory failure ([place]) 07/11/2017  . Acute respiratory failure ([place]) 07/10/2017  . CAP (community acquired pneumonia) 07/10/2017  . Influenza A 07/10/2017  . COPD exacerbation ([place]) 07/10/2017  . HLD (hyperlipidemia) 03/09/2017  . Essential hypertension 08/14/2016  . 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 Bradycardia 08/05/2016   Past Medical History:  Diagnosis Date  . Back pain   . Cardiomegaly   . COPD (chronic obstructive pulmonary disease) ([place])   . Cystic kidney disease   . Depressive disorder   . GERD (gastroesophageal reflux disease)   . High cholesterol   . History of prostate cancer   . Hypertension   . Prostate cancer ([place]) 2018   Rad seeds  . Thyroid nodule      Allergies as of 06/29/2020   No Known Allergies     Medication List    TAKE these medications   amLODipine 5 MG tablet Commonly known as: NORVASC Take 5 mg by mouth every morning.   aspirin 81 MG EC tablet Take 1 tablet (81 mg total) by mouth daily at 6 (six) AM. Swallow whole.   atorvastatin 40 MG tablet Commonly known as: LIPITOR Take 1 tablet (40 mg total) by mouth at bedtime.   budesonide-formoterol 160-4.5 MCG/ACT inhaler Commonly known as: SYMBICORT Inhale 2 puffs into the lungs 2 (two) times daily.   donepezil 5 MG tablet Commonly known as: ARICEPT Take 5 mg by mouth at bedtime.    ipratropium-albuterol 0.5-2.5 (3) MG/3ML Soln Commonly known as: DUONEB Inhale 3 mLs into the lungs every 6 (six) hours as needed (Wheezing).   megestrol 20 MG tablet Commonly known as: MEGACE Take 20 mg by mouth 2 (two) times daily.   oxyCODONE 5 MG immediate release tablet Commonly known as: Oxy IR/ROXICODONE Take 1-2 tablets (5-10 mg total) by mouth every 4 (four) hours as needed for severe pain.   OXYGEN Inhale 2-3 L into the lungs as needed (at bedtime).   pantoprazole 40 MG tablet Commonly known as: PROTONIX Take 40 mg by mouth 2 (two) times daily.   tamsulosin 0.4 MG Caps capsule Commonly known as: FLOMAX Take 0.4 mg by mouth daily.   valsartan 160 MG tablet Commonly known as: DIOVAN Take 160 mg by mouth daily.   Vitamin D3 50 MCG (2000 UT) Chew Chew 2,000 Units by mouth daily.       Discharge Instructions:   [facility]  Discharge Instructions Endovascular Aortic Aneurysm Repair  Please refer to the following instructions for your post-procedure care. Your surgeon or Physician Assistant will discuss any changes  with you.  Activity  You are encouraged to walk as much as you can. You can slowly return to normal activities but must avoid strenuous activity and heavy lifting until your doctor tells you it's OK. Avoid activities such as vacuuming or swinging a gold club. It is normal to feel tired for several weeks after your surgery. Do not drive until your doctor gives the OK and you are no longer taking prescription pain medications. It is also normal to have difficulty with sleep habits, eating, and bowel movements after surgery. These will go away with time.  Bathing/Showering  You may shower after you go home. If you have an incision, do not soak in a bathtub, hot tub, or swim until the incision heals completely.  Incision Care  Shower every day. Clean your incision with mild soap and water. Pat the area dry with a clean  towel. You do not need a bandage unless otherwise instructed. Do not apply any ointments or creams to your incision. If you clothing is irritating, you may cover your incision with a dry gauze pad.  Diet  Resume your normal diet. There are no special food restrictions following this procedure. A low fat/low cholesterol diet is recommended for all patients with vascular disease. In order to heal from your surgery, it is CRITICAL to get adequate nutrition. Your body requires vitamins, minerals, and protein. Vegetables are the best source of vitamins and minerals. Vegetables also provide the perfect balance of protein. Processed food has little nutritional value, so try to avoid this.  Medications  Resume taking all of your medications unless your doctor or Physician Assistnat tells you not to. If your incision is causing pain, you may take over-the-counter pain relievers such as acetaminophen (Tylenol). If you were prescribed a stronger pain medication, please be aware these medications can cause nausea and constipation. Prevent nausea by taking the medication with a snack or meal. Avoid constipation by drinking plenty of fluids and eating foods with a high amount of fiber, such as fruits, vegetables, and grains. Do not take Tylenol if you are taking prescription pain medications.   Follow up  [place] office will schedule a follow-up appointment with a C.T. scan 3-4 weeks after your surgery.  Please call us immediately for any of the following conditions  . Severe or worsening pain in your legs or feet or in your abdomen back or chest. . Increased pain, redness, drainage (pus) from your incision sit. . Increased abdominal pain, bloating, nausea, vomiting or persistent diarrhea. . Fever of 101 degrees or higher. . Swelling in your leg (s), .  Reduce your risk of vascular disease  .Stop smoking. If you would like help call [facility] at 1-800-QUIT-NOW (240)762-2955) or [place] at  (937)534-3530. .Manage your cholesterol .Maintain a desired weight .Control your diabetes .Keep your blood pressure down  If you have questions, please call the office at [phone].    Prescriptions given: Hydrocodone- Acetaminophen 5-325 mg  #15 No Refill  Disposition: Skilled nursing facility  Patient's condition: is Good  Follow up: 1. Dr. Stanford Breed in 4 weeks with CTA protocol   Barbie Banner, PA-C [facility] [phone] 06/29/2020  7:57 AM   - For VQI Registry use - Post-op:  Time to Extubation: [X]  In OR, [ ]  < 12 hrs, [ ]  12-24 hrs, [ ]  >=24 hrs Vasopressors Req. Post-op: No MI: No., [ ]  Troponin only, [ ]  EKG or Clinical New Arrhythmia: No CHF: No ICU Stay: 0  days Transfusion: No     If yes, 0 units given  Complications: Resp failure: No., [ ]  Pneumonia, [ ]  Ventilator Chg in renal function: No., [ ]  Inc. Cr > 0.5, [ ]  Temp. Dialysis,  [ ]  Permanent dialysis Leg ischemia: No., no Surgery needed, [ ]  Yes, Surgery needed,  [ ]  Amputation Bowel ischemia: No., [ ]  Medical Rx, [ ]  Surgical Rx Wound complication: No., [ ]  Superficial separation/infection, [ ]  Return to OR Return to OR: No  Return to OR for bleeding: No Stroke: No., [ ]  Minor, [ ]  Major  Discharge medications: Statin use:  Yes  ASA use:  Yes  Plavix use:  No  Beta blocker use:  No  ARB use:  Yes ACEI use:  No  CCB use:  Yes

## 2020-06-28 NOTE — TOC Progression Note (Addendum)
Transition of Care [facility]) - Progression Note    Patient Details  Name: James Dickerson MRN: [id number] Date of Birth: 1940/08/21  Transition of Care [facility]) CM/SW [place], [place] Phone Number: 06/28/2020, 4:34 PM  Clinical Narrative:     If patient remains medically stable - he can d/c to [facility] tomorrow - SNF is requesting d/c summary as soon as possible. Covid test completed   CSW will continue to follow and assist with discharge planning.  Updated patient's daughter,Sissy.  Thurmond Butts, MSW, LCSW Clinical Social Worker   Expected Discharge Plan: Skilled Nursing Facility Barriers to Discharge: SNF Pending bed offer,Continued Medical Work up,Insurance Authorization  Expected Discharge Plan and Services Expected Discharge Plan: [place] In-house Referral: Clinical Social Work     Living arrangements for the past 2 months: Apartment                                       Social Determinants of Health (SDOH) Interventions    Readmission Risk Interventions No flowsheet data found.

## 2020-06-28 NOTE — TOC Progression Note (Signed)
Transition of Care [facility]) - Progression Note    Patient Details  Name: James Dickerson MRN: [id number] Date of Birth: 09/15/1940  Transition of Care [facility]) CM/SW [place], [place] Phone Number: 06/28/2020, 11:10 AM  Clinical Narrative:     Started insurance authorization reference # [id number]   Thurmond Butts, MSW, LCSW Clinical Social Worker    Expected Discharge Plan: Skilled Nursing Facility Barriers to Discharge: SNF Pending bed offer,Continued Medical Work up,Insurance Authorization  Expected Discharge Plan and Services Expected Discharge Plan: [place] In-house Referral: Clinical Social Work     Living arrangements for the past 2 months: Apartment                                       Social Determinants of Health (SDOH) Interventions    Readmission Risk Interventions No flowsheet data found.

## 2020-06-28 NOTE — Progress Notes (Signed)
Mobility Specialist - Progress Note   06/28/20 1205  Mobility  Activity Refused mobility   Pt refused mobility, stating he is still fatigued from PT. Will f/u as able.   Pricilla Handler Mobility Specialist Mobility Specialist Phone: [phone]'

## 2020-06-28 NOTE — Progress Notes (Signed)
Occupational Therapy Treatment Patient Details Name: James Dickerson MRN: [id number] DOB: [birth year] Today's Date: 06/28/2020    History of present illness He presented to [facility] ER reporting a day of right lower quadrant/iliac crest pain and mid lumbar pain.  He reports he has had nausea with poor appetite over the past several months. 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 1/30 pt underwent bilateral open femoral artery exposure and direct repair; endovascular aortic aneurysm repair; right external iliac artery angioplasty and stenting; and cystoscopy, suprapubic catheter and foley catheter placement.   OT comments  Pt received awake and alert agreeable to session, yet c/o feeling fatigued from PT session this AM. Mod I supine to sit EOB, Min guard sit to stand with RW, cues for hand placement to power up to stand. Pt unable to tolerate standing at sink for grooming and required to sit with set up for oral care and washing face. Pt is limited with fatigue and reports still feeling week, requiring rest breaks. Pt will benefit to continue acute OT to address established goals and to maximize independence prior to DC.    Follow Up Recommendations  SNF;Supervision/Assistance - 24 hour    Equipment Recommendations  Other (comment) (TBD next venue)    Recommendations for Other Services      Precautions / Restrictions Precautions Precautions: Fall Precaution Comments: Lower abodominal incision and suprapubic catheter Restrictions Weight Bearing Restrictions: No       Mobility Bed Mobility Overal bed mobility: Needs Assistance Bed Mobility: Supine to Sit   Sidelying to sit: Supervision Supine to sit: Modified independent (Device/Increase time)   Sit to sidelying: Supervision General bed mobility comments: increased time good strength presenting BLE to EOB and elevating trunk with use of bed rail.  Transfers Overall transfer level: Needs assistance Equipment  used: Rolling walker (2 wheeled) Transfers: Sit to/from Stand Sit to Stand: Min guard         General transfer comment: cues for hand placement with poor carryover    Balance Overall balance assessment: Needs assistance Sitting-balance support: No upper extremity supported;Feet supported Sitting balance-Leahy Scale: Good     Standing balance support: Bilateral upper extremity supported Standing balance-Leahy Scale: Poor Standing balance comment: heavily reliant on RW and min guard for safety                           ADL either performed or assessed with clinical judgement   ADL Overall ADL's : Needs assistance/impaired     Grooming: Set up;Sitting;Wash/dry face;Oral care;Supervision/safety Grooming Details (indicate cue type and reason): Pt seated at sink, required several rest breaks. Unable to localize discomfort or level of fatigue.                 Toilet Transfer: Ambulation;RW;Min Psychiatric nurse Details (indicate cue type and reason): simulated toilet transfer from bed to chair at sink for grooming. No physical assistance required mostly for safety.         Functional mobility during ADLs: Min guard;Rolling walker       Vision       Perception     Praxis      Cognition Arousal/Alertness: Awake/alert (drowsy but easily awoken) Behavior During Therapy: WFL for tasks assessed/performed Overall Cognitive Status: Within Functional Limits for tasks assessed  General Comments: needs heavy encouragement, pt hesitant to mobilize, reports feeling unwell but unable to localize area of discomfort/denies nausea or severe pain        Exercises Exercises: Other exercises Other Exercises Other Exercises: verbal encouragement/review of LE HEP per handout in room, pt not receptive to instruction   Shoulder Instructions       General Comments HR 99-103    Pertinent Vitals/ Pain       Pain  Assessment: No/denies pain Faces Pain Scale: Hurts little more Pain Location: incisional when he moves Pain Descriptors / Indicators: Sore;Grimacing;Guarding Pain Intervention(s): Monitored during session  Home Living                                          Prior Functioning/Environment              Frequency  Min 2X/week        Progress Toward Goals  OT Goals(current goals can now be found in the care plan section)  Progress towards OT goals: Progressing toward goals  Acute Rehab OT Goals Patient Stated Goal: to get stronger--agreeable to rehab before home OT Goal Formulation: With patient Time For Goal Achievement: 07/09/20 Potential to Achieve Goals: Good ADL Goals Pt Will Perform Grooming: Independently;standing Pt Will Perform Upper Body Bathing: sitting;standing;with modified independence Pt Will Perform Lower Body Bathing: with modified independence;sit to/from stand Pt Will Perform Upper Body Dressing: Independently;sitting Pt Will Perform Lower Body Dressing: with modified independence;sit to/from stand Pt Will Transfer to Toilet: with modified independence;ambulating;regular height toilet;grab bars Pt Will Perform Toileting - Clothing Manipulation and hygiene: sit to/from stand;Independently Additional ADL Goal #1: Pt will be independent in and OOB for basic ADLs  Plan Discharge plan remains appropriate;Frequency remains appropriate    Co-evaluation                 AM-PAC OT "6 Clicks" Daily Activity     Outcome Measure   Help from another person eating meals?: None Help from another person taking care of personal grooming?: A Little Help from another person toileting, which includes using toliet, bedpan, or urinal?: A Little Help from another person bathing (including washing, rinsing, drying)?: A Little Help from another person to put on and taking off regular upper body clothing?: A Little Help from another person to put on  and taking off regular lower body clothing?: A Little 6 Click Score: 19    End of Session Equipment Utilized During Treatment: Gait belt;Rolling walker  OT Visit Diagnosis: Unsteadiness on feet (R26.81);Other abnormalities of gait and mobility (R26.89);Muscle weakness (generalized) (M62.81);Pain Pain - part of body:  (incisional)   Activity Tolerance Patient tolerated treatment well   Patient Left in chair;with call bell/phone within reach   Nurse Communication          Time: 2774-1287 OT Time Calculation (min): 17 min  Charges: OT General Charges $OT Visit: 1 Visit OT Treatments $Self Care/Home Management : 8-22 mins  Minus Breeding, MSOT, OTR/L  Supplemental Rehabilitation Services  [phone]    Marius Ditch 06/28/2020, 12:20 PM

## 2020-06-28 NOTE — TOC Progression Note (Signed)
Transition of Care [facility]) - Progression Note    Patient Details  Name: James Dickerson MRN: [id number] Date of Birth: 09-Jun-1940  Transition of Care [facility]) CM/SW [place], [place] Phone Number: 06/28/2020, 10:32 AM  Clinical Narrative:     Verlee Monte  [phone]  Thurmond Butts, MSW, LCSW Clinical Social Worker   Expected Discharge Plan: Skilled Nursing Facility Barriers to Discharge: SNF Pending bed offer,Continued Medical Work up,Insurance Authorization  Expected Discharge Plan and Services Expected Discharge Plan: [place] In-house Referral: Clinical Social Work     Living arrangements for the past 2 months: Apartment                                       Social Determinants of Health (SDOH) Interventions    Readmission Risk Interventions No flowsheet data found.

## 2020-06-29 MED ORDER — ASPIRIN 81 MG PO TBEC: 81.0000 mg | DELAYED_RELEASE_TABLET | Freq: Every day | ORAL | 11 refills | Status: DC

## 2020-06-29 MED ORDER — OXYCODONE HCL 5 MG PO TABS: 5.0000 mg | ORAL_TABLET | ORAL | 0 refills | Status: DC | PRN

## 2020-06-29 NOTE — TOC Progression Note (Signed)
Transition of Care [facility]) - Progression Note    Patient Details  Name: James Dickerson MRN: [id number] Date of Birth: February 22, 1941  Transition of Care [facility]) CM/SW [place], [place] Phone Number: [phone] 06/29/2020, 9:32 AM  Clinical Narrative:    CSW called [facility] in regards to discharge today and had to leave a message.CSW is awaiting a call back.  TOC team will continue to assist with discharge planning needs.   Expected Discharge Plan: Skilled Nursing Facility Barriers to Discharge: Barriers Resolved  Expected Discharge Plan and Services Expected Discharge Plan: [place] In-house Referral: Clinical Social Work     Living arrangements for the past 2 months: Apartment Expected Discharge Date: 06/29/20                                     Social Determinants of Health (SDOH) Interventions    Readmission Risk Interventions No flowsheet data found.

## 2020-06-29 NOTE — Progress Notes (Signed)
Attempted to call report to [facility] [phone] and transferred to voicemail.  Left second message.  Will await call back for patient report. Payton Emerald, RN

## 2020-06-29 NOTE — Progress Notes (Signed)
Attempted to call report and transferred to voicemail. Left message and will attempt to call again as time permits. PTAR at bedside to pick up patient. Payton Emerald, RN

## 2020-06-29 NOTE — TOC Transition Note (Signed)
Transition of Care [facility]) - CM/SW Discharge Note   Patient Details  Name: James Dickerson MRN: [id number] Date of Birth: 19-Nov-1940  [facility]) CM/SW Contact:  Bary Castilla, LCSW Phone Number: [phone] 06/29/2020, 11:37 AM   Clinical Narrative:    Patient will DC to:?[facility] Anticipated DC date:?06/29/20 Family notified:?Sissy Transport by: Corey Harold   Per MD patient ready for DC to [facility].  RN, patient, patient's family, and facility notified of DC. Discharge Summary sent to facility. RN given number for report [phone] room 207. DC packet on chart. Ambulance transport requested for patient.   CSW signing off.   [place], [place] [phone]    Final next level of care: Skilled Nursing Facility Barriers to Discharge: Barriers Resolved   Patient Goals and CMS Choice        Discharge Placement              Patient [facility] bed at: [facility] Patient to be transferred to facility by: [place] Name of family member notified: Sissy Patient and family notified of of transfer: 06/29/20  Discharge Plan and Services In-house Referral: Clinical Social Work                                   Social Determinants of Health (SDOH) Interventions     Readmission Risk Interventions No flowsheet data found.

## 2020-06-29 NOTE — Progress Notes (Addendum)
Progress Note    06/29/2020 7:48 AM 7 Days Post-Op  Subjective:  Doing well. Pain well controlled.   Vitals:   06/28/20 2335 06/29/20 0440  BP: 138/81 125/71  Pulse: 71 77  Resp: 20 17  Temp: 98.8 F (37.1 C) 98.5 F (36.9 C)  SpO2: 97% 97%    Physical Exam: Cardiac:regular Lungs:non labored Incisions:bilateral groin incisions clean, dry and intact without hematoma or drainage. 2+ femoral pulses Extremities:well perfused and warm. Easily palpable DP pulses bilaterally. Motor and sensation intact Abdomen:flat, soft, non tender. SP catheter dressings in place. Clean dry and intact Neurologic:alert and oriented      CBC    Component Value Date/Time   WBC 7.4 06/25/2020 0045   RBC 3.17 (L) 06/25/2020 0045   HGB 10.2 (L) 06/25/2020 0045   HGB 14.8 08/10/2011 0555   HCT 29.6 (L) 06/25/2020 0045   HCT 44.3 08/10/2011 0555   PLT 160 06/25/2020 0045   PLT 183 08/10/2011 0555   MCV 93.4 06/25/2020 0045   MCV 94 08/10/2011 0555   MCH 32.2 06/25/2020 0045   MCHC 34.5 06/25/2020 0045   RDW 13.0 06/25/2020 0045   RDW 13.3 08/10/2011 0555   LYMPHSABS 0.6 (L) 06/15/2018 1722   LYMPHSABS 1.8 08/10/2011 0555   MONOABS 0.5 06/15/2018 1722   MONOABS 0.5 08/10/2011 0555   EOSABS 0.0 06/15/2018 1722   EOSABS 0.1 08/10/2011 0555   BASOSABS 0.0 06/15/2018 1722   BASOSABS 0.0 08/10/2011 0555    BMET    Component Value Date/Time   NA 134 (L) 06/25/2020 0045   NA 136 08/10/2011 0555   K 3.8 06/25/2020 0045   K 4.6 08/10/2011 0555   CL 100 06/25/2020 0045   CL 99 08/10/2011 0555   CO2 27 06/25/2020 0045   CO2 32 08/10/2011 0555   GLUCOSE 88 06/25/2020 0045   GLUCOSE 81 08/10/2011 0555   BUN 8 06/25/2020 0045   BUN 14 08/10/2011 0555   CREATININE 0.91 06/25/2020 0045   CREATININE 1.17 08/10/2011 0555   CALCIUM 8.5 (L) 06/25/2020 0045   CALCIUM 8.7 08/10/2011 0555   GFRNONAA >60 06/25/2020 0045   GFRNONAA >60 08/10/2011 0555   GFRAA >60 06/15/2018 1603    GFRAA >60 08/10/2011 0555     Intake/Output Summary (Last 24 hours) at 06/29/2020 0748 Last data filed at 06/29/2020 0600 Gross per 24 hour  Intake 1140 ml  Output 3115 ml  Net -1975 ml    HOSPITAL MEDICATIONS Scheduled Meds: . acetaminophen  650 mg Oral Q6H  . amLODipine  5 mg Oral q morning - 10a  . aspirin EC  81 mg Oral Q0600  . atorvastatin  40 mg Oral QHS  . Chlorhexidine Gluconate Cloth  6 each Topical Daily  . cholecalciferol  2,000 Units Oral Daily  . docusate sodium  100 mg Oral Daily  . donepezil  10 mg Oral QHS  . heparin  5,000 Units Subcutaneous Q8H  . mouth rinse  15 mL Mouth Rinse BID  . mometasone-formoterol  2 puff Inhalation BID  . pantoprazole  40 mg Oral BID  . tamsulosin  0.4 mg Oral Daily   Continuous Infusions: . sodium chloride    . sodium chloride Stopped (06/27/20 1021)  . lactated ringers 100 mL/hr at 06/29/20 0400  . magnesium sulfate bolus IVPB     PRN Meds:.sodium chloride, alum & mag hydroxide-simeth, bisacodyl, guaiFENesin-dextromethorphan, hydrALAZINE, ipratropium-albuterol, magnesium sulfate bolus IVPB, metoprolol tartrate, morphine injection, ondansetron, oxyCODONE, phenol, polyethylene glycol, potassium chloride  Assessment and Plan: POD 7 80 y.o.maleis s/p EVAR / REIA stenting for symptomatic aortic aneurysm. Complicated by urethral injury during preoperative foley placement.  Doing well post op. Pain well controlled. Lower extremities well perfused and warm with palpable DP pulses bilaterally. Tolerating diet. Good UOP via SP cath. Continue to mobilize/ oob. Pending SNF placement. Stable for discharge.  Urology recs: - Difficult Foley, Urethral Stricture- keep SPT capped and foley to gravity this admission. DC with same tubes. Will likely perform office trial of void in 3-4 weeks .   Continue asa and statin.  [facility] confirmed bed offer -DVT prophylaxis:  Heparin [place]  Follow-up in 3 weeks with CTA.  Risa Grill,  PA-C [facility] [phone] 06/29/2020  7:48 AM   I have seen and evaluated the patient. I agree with the PA note as documented above.  80 year old male status post EVAR with right external iliac stenting for symptomatic abdominal aortic aneurysm.  This was complicated by urethral injury required SP tube and he also now has a Foley catheter.  Palpable DP pulses.  No acute events overnight.  Plan is discharge to SNF today.  Will arrange follow-up with urology and send our office a message to ensure he has this appointment.  Also arrange follow-up with Dr. Stanford Breed in 4 weeks with CTA abdomen pelvis.  Marty Heck, MD [facility] [place] Office: [phone]

## 2020-07-02 ENCOUNTER — Other Ambulatory Visit: Payer: Self-pay

## 2020-07-02 DIAGNOSIS — I712 Thoracic aortic aneurysm, without rupture, unspecified: Secondary | ICD-10-CM

## 2020-07-02 DIAGNOSIS — I714 Abdominal aortic aneurysm, without rupture, unspecified: Secondary | ICD-10-CM

## 2020-07-03 ENCOUNTER — Inpatient Hospital Stay: Admission: RE | Admit: 2020-07-03 | Payer: Medicare Other | Source: Ambulatory Visit

## 2020-07-04 ENCOUNTER — Ambulatory Visit: Payer: Medicare Other | Admitting: Cardiothoracic Surgery

## 2020-07-04 ENCOUNTER — Inpatient Hospital Stay: Admission: RE | Admit: 2020-07-04 | Payer: Medicare Other | Source: Ambulatory Visit

## 2020-07-18 ENCOUNTER — Other Ambulatory Visit: Payer: Self-pay

## 2020-07-18 ENCOUNTER — Emergency Department
Admission: EM | Admit: 2020-07-18 | Discharge: 2020-07-18 | Disposition: A | Discharge status: Home / Self Care | Payer: Medicare Other | Attending: Emergency Medicine | Admitting: Emergency Medicine

## 2020-07-18 DIAGNOSIS — T8384XA Pain from genitourinary prosthetic devices, implants and grafts, initial encounter: Secondary | ICD-10-CM | POA: Diagnosis present

## 2020-07-18 DIAGNOSIS — R319 Hematuria, unspecified: Secondary | ICD-10-CM | POA: Diagnosis not present

## 2020-07-18 DIAGNOSIS — Y732 Prosthetic and other implants, materials and accessory gastroenterology and urology devices associated with adverse incidents: Secondary | ICD-10-CM | POA: Diagnosis not present

## 2020-07-18 DIAGNOSIS — N390 Urinary tract infection, site not specified: Secondary | ICD-10-CM | POA: Insufficient documentation

## 2020-07-18 LAB — URINALYSIS, COMPLETE (UACMP) WITH MICROSCOPIC
Bacteria, UA: NONE SEEN
Bilirubin Urine: NEGATIVE
Glucose, UA: NEGATIVE mg/dL
Ketones, ur: NEGATIVE mg/dL
Nitrite: POSITIVE — AB
Protein, ur: >=300 mg/dL — AB
RBC / HPF: >50 RBC/hpf — ABNORMAL HIGH (ref 0–5)
Specific Gravity, Urine: 1.02 (ref 1.005–1.030)
Squamous Epithelial / HPF: NONE SEEN (ref 0–5)
WBC, UA: >50 WBC/hpf — ABNORMAL HIGH (ref 0–5)
pH: 5 (ref 5.0–8.0)

## 2020-07-18 LAB — BASIC METABOLIC PANEL
Anion gap: 8 (ref 5–15)
BUN: 20 mg/dL (ref 8–23)
CO2: 24 mmol/L (ref 22–32)
Calcium: 9.3 mg/dL (ref 8.9–10.3)
Chloride: 105 mmol/L (ref 98–111)
Creatinine, Ser: 1.09 mg/dL (ref 0.61–1.24)
GFR, Estimated: >60 mL/min (ref >60)
Glucose, Bld: 103 mg/dL — ABNORMAL HIGH (ref 70–99)
Potassium: 4 mmol/L (ref 3.5–5.1)
Sodium: 137 mmol/L (ref 135–145)

## 2020-07-18 LAB — CBC
HCT: 34.2 % — ABNORMAL LOW (ref 39.0–52.0)
Hemoglobin: 11.5 g/dL — ABNORMAL LOW (ref 13.0–17.0)
MCH: 31.3 pg (ref 26.0–34.0)
MCHC: 33.6 g/dL (ref 30.0–36.0)
MCV: 92.9 fL (ref 80.0–100.0)
Platelets: 257 10*3/uL (ref 150–400)
RBC: 3.68 MIL/uL — ABNORMAL LOW (ref 4.22–5.81)
RDW: 13.2 % (ref 11.5–15.5)
WBC: 7 10*3/uL (ref 4.0–10.5)
nRBC: 0 % (ref 0.0–0.2)

## 2020-07-18 MED ORDER — SODIUM CHLORIDE 0.9 % IV SOLN
1.0000 g | Freq: Once | INTRAVENOUS | Status: AC
  Administered 2020-07-18: 1 g via INTRAVENOUS
  Filled 2020-07-18: qty 10

## 2020-07-18 MED ORDER — SULFAMETHOXAZOLE-TRIMETHOPRIM 800-160 MG PO TABS: 1.0000 | ORAL_TABLET | Freq: Two times a day (BID) | ORAL | 0 refills | Status: DC

## 2020-07-18 NOTE — ED Notes (Signed)
Spoke with daughter Josephina Shih)  instructions given  Also leg bag applied to foley

## 2020-07-18 NOTE — Discharge Instructions (Signed)
Follow-up with your primary care provider unless you have a urologist to make sure that your urinary tract infection has cleared up.  Drink lots of fluids.  Take the antibiotics that were sent to your pharmacy until completely finished.  Return to the emergency department if any severe worsening of your symptoms such as fever, chills, nausea or vomiting.  If you do not already have a urologist Dr. Jeb Levering is listed on your discharge papers who is a urologist.  Call and make an appointment.

## 2020-07-18 NOTE — ED Provider Notes (Signed)
[facility] Emergency Department Provider Note  ____________________________________________   Event Date/Time   First MD Initiated Contact with Patient 07/18/20 1414     (approximate)  I have reviewed the triage vital signs and the nursing notes.   HISTORY  Chief Complaint Urinary Catheter Pain   HPI James Dickerson is a 80 y.o. male presents to the ED via EMS from home stating that he has pain at his urinary catheter for the last several days.  Patient states that catheter was placed 4 days ago.  He denies any fever but states that he has had some chills.  He denies any nausea, vomiting or diarrhea.  Patient has a history of urinary tract infections.       Past Medical History:  Diagnosis Date  . Back pain   . Cardiomegaly   . COPD (chronic obstructive pulmonary disease) ([place])   . Cystic kidney disease   . Depressive disorder   . GERD (gastroesophageal reflux disease)   . High cholesterol   . History of prostate cancer   . Hypertension   . Prostate cancer ([place]) 2018   Rad seeds  . Thyroid nodule     Patient Active Problem List   Diagnosis Date Noted  . AAA (abdominal aortic aneurysm) ([place]) 06/23/2020  . Status post surgery 06/22/2020  . Thoracic aortic aneurysm without rupture ([place]) 12/18/2019  . Respiratory failure ([place]) 07/11/2017  . Acute respiratory failure ([place]) 07/10/2017  . CAP (community acquired pneumonia) 07/10/2017  . Influenza A 07/10/2017  . COPD exacerbation ([place]) 07/10/2017  . HLD (hyperlipidemia) 03/09/2017  . Essential hypertension 08/14/2016  . 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 Bradycardia 08/05/2016    Past Surgical History:  Procedure Laterality Date  . ABDOMINAL AORTIC ENDOVASCULAR STENT GRAFT N/A 06/22/2020   Procedure: ABDOMINAL AORTIC ENDOVASCULAR STENT GRAFT;  Surgeon: Cherre Robins, MD;  Location: [place];  Service: Vascular;  Laterality: N/A;  . APPENDECTOMY  1971  . CYSTOSCOPY WITH  URETHRAL DILATATION N/A 06/22/2020   Procedure: CYSTOSCOPY WITH URETHRAL DILATATION;  Surgeon: Alexis Frock, MD;  Location: [place];  Service: Urology;  Laterality: N/A;  . INSERTION OF ILIAC STENT Right 06/22/2020   Procedure: INSERTION OF RIGHT EXTERNAL ILIAC ARTERY STENT;  Surgeon: Cherre Robins, MD;  Location: [place];  Service: Vascular;  Laterality: Right;  . INSERTION OF SUPRAPUBIC CATHETER N/A 06/22/2020   Procedure: INSERTION OF SUPRAPUBIC CATHETER;  Surgeon: Alexis Frock, MD;  Location: [place];  Service: Urology;  Laterality: N/A;  . PACEMAKER INSERTION N/A 08/06/2016   Procedure: INSERTION PACEMAKER;  Surgeon: Isaias Cowman, MD;  Location: [facility];  Service: Cardiovascular;  Laterality: N/A;    Prior to Admission medications   Medication Sig Start Date End Date Taking? Authorizing Provider  sulfamethoxazole-trimethoprim (BACTRIM DS) 800-160 MG tablet Take 1 tablet by mouth 2 (two) times daily. 07/18/20  Yes Letitia Neri L, PA-C  amLODipine (NORVASC) 5 MG tablet Take 5 mg by mouth every morning. 09/20/19   [provider]  aspirin EC 81 MG EC tablet Take 1 tablet (81 mg total) by mouth daily at 6 (six) AM. Swallow whole. 06/29/20   Setzer, Edman Circle, PA-C  atorvastatin (LIPITOR) 40 MG tablet Take 1 tablet (40 mg total) by mouth at bedtime. 06/17/18   Mayo, Pete Pelt, MD  budesonide-formoterol [facility]) 160-4.5 MCG/ACT inhaler Inhale 2 puffs into the lungs 2 (two) times daily. 02/13/18   Merlyn Lot, MD  Cholecalciferol (VITAMIN D3) 50  MCG (2000 UT) CHEW Chew 2,000 Units by mouth daily.    [provider]  donepezil (ARICEPT) 5 MG tablet Take 5 mg by mouth at bedtime.    [provider]  ipratropium-albuterol (DUONEB) 0.5-2.5 (3) MG/3ML SOLN Inhale 3 mLs into the lungs every 6 (six) hours as needed (Wheezing). 01/21/20   [provider]  megestrol (MEGACE) 20 MG tablet Take 20 mg by mouth 2 (two) times daily. 06/06/20   [provider]  oxyCODONE (OXY IR/ROXICODONE) 5 MG immediate release tablet Take 1-2 tablets (5-10 mg total) by mouth every 4 (four) hours as needed for severe pain. 06/29/20   Marty Heck, MD  OXYGEN Inhale 2-3 L into the lungs as needed (at bedtime).    [provider]  pantoprazole (PROTONIX) 40 MG tablet Take 40 mg by mouth 2 (two) times daily. 10/18/19   [provider]  tamsulosin (FLOMAX) 0.4 MG CAPS capsule Take 0.4 mg by mouth daily. 10/14/19   [provider]  valsartan (DIOVAN) 160 MG tablet Take 160 mg by mouth daily. 05/04/18   [provider]    Allergies Patient has no known allergies.  History reviewed. No pertinent family history.  Social History Social History   Tobacco Use  . Smoking status: Current Every Day Smoker    Packs/day: 0.25    Years: 67.00    Pack years: 16.75    Types: Cigarettes  . Smokeless tobacco: Never Used  . Tobacco comment: 3 every other day  Vaping Use  . Vaping Use: Never used  Substance Use Topics  . Alcohol use: No  . Drug use: No    Review of Systems Constitutional: No fever/chills Eyes: No visual changes. Cardiovascular: Denies chest pain. Respiratory: Denies shortness of breath. Gastrointestinal: No abdominal pain.  No nausea, no vomiting.  No diarrhea.  No constipation. Genitourinary: Positive for dysuria, positive Foley catheter, positive suprapubic catheter. Musculoskeletal: Negative for muscle skeletal pain. Skin: Negative for rash. Neurological: Negative for headaches, focal weakness or numbness. ____________________________________________   PHYSICAL EXAM:  VITAL SIGNS: ED Triage Vitals  Enc Vitals Group     BP 07/18/20 1212 101/90     Pulse Rate 07/18/20 1212 93     Resp 07/18/20 1212 18     Temp 07/18/20 1212 97.8 F (36.6 C)     Temp Source 07/18/20 1212 Oral     SpO2 07/18/20 1212 98 %     Weight 07/18/20 1212 109 lb (49.4 kg)     Height 07/18/20 1212 5\' 3"  (1.6 m)     Head  Circumference --      Peak Flow --      Pain Score 07/18/20 1214 7     Pain Loc --      Pain Edu? --      Excl. in [place]? --     Constitutional: Alert and oriented. Well appearing and in no acute distress. Eyes: Conjunctivae are normal. PERRL. EOMI. Head: Atraumatic. Neck: No stridor.   Cardiovascular: Normal rate, regular rhythm. Grossly normal heart sounds.  Good peripheral circulation. Respiratory: Normal respiratory effort.  No retractions. Lungs CTAB. Gastrointestinal: Soft and nontender. No distention.  No CVA tenderness. Neurologic:  Normal speech and language. No gross focal neurologic deficits are appreciated.  Skin:  Skin is warm, dry and intact. No rash noted. Psychiatric: Mood and affect are normal. Speech and behavior are normal.  ____________________________________________   LABS (all labs ordered are listed, but only abnormal results are  displayed)  Labs Reviewed  URINALYSIS, COMPLETE (UACMP) WITH MICROSCOPIC - Abnormal; Notable for the following components:      Result Value   Color, Urine AMBER (*)    APPearance CLOUDY (*)    Hgb urine dipstick LARGE (*)    Protein, ur >=300 (*)    Nitrite POSITIVE (*)    Leukocytes,Ua LARGE (*)    RBC / HPF >50 (*)    WBC, UA >50 (*)    All other components within normal limits  BASIC METABOLIC PANEL - Abnormal; Notable for the following components:   Glucose, Bld 103 (*)    All other components within normal limits  CBC - Abnormal; Notable for the following components:   RBC 3.68 (*)    Hemoglobin 11.5 (*)    HCT 34.2 (*)    All other components within normal limits  URINE CULTURE    PROCEDURES  Procedure(s) performed (including Critical Care):  Procedures   ____________________________________________   INITIAL IMPRESSION / ASSESSMENT AND PLAN / ED COURSE  As part of my medical decision making, I reviewed the following data within the electronic MEDICAL RECORD NUMBER Notes from prior ED visits and [place] Controlled  Substance Database  80 year old male presents to the ED via EMS with complaint of dysuria and also pain at his Foley catheter.  Patient states his Foley catheter was placed 4 days ago.  Patient also has a suprapubic catheter in place.  He has a history of urinary tract infections.  Urinalysis shows an acute UTI and patient was given Rocephin 1 g IM ED while in the ED.  ____________________________________________   FINAL CLINICAL IMPRESSION(S) / ED DIAGNOSES  Final diagnoses:  Urinary tract infection with hematuria, site unspecified     ED Discharge Orders         Ordered    sulfamethoxazole-trimethoprim (BACTRIM DS) 800-160 MG tablet  2 times daily        07/18/20 1533          *Please note:  Ladarren Steiner was evaluated in Emergency Department on 07/18/2020 for the symptoms described in the history of present illness. He was evaluated in the context of the global COVID-19 pandemic, which necessitated consideration that the patient might be at risk for infection with the SARS-CoV-2 virus that causes COVID-19. Institutional protocols and algorithms that pertain to the evaluation of patients at risk for COVID-19 are in a state of rapid change based on information released by regulatory bodies including the CDC and federal and state organizations. These policies and algorithms were followed during the patient's care in the ED.  Some ED evaluations and interventions may be delayed as a result of limited staffing during and the pandemic.*   Note:  This document was prepared using Dragon voice recognition software and may include unintentional dictation errors.    Johnn Hai, PA-C 07/18/20 1550    Carrie Mew, MD 07/19/20 [phone]

## 2020-07-18 NOTE — ED Triage Notes (Signed)
Pt comes into the ED via EMS from home with c/o pain at the urinary catheter for the past couple of days. Placed 4 days ago.. pt also has a suprapubic catheter in place.

## 2020-07-21 ENCOUNTER — Other Ambulatory Visit: Payer: Self-pay

## 2020-07-21 ENCOUNTER — Emergency Department
Admission: EM | Admit: 2020-07-21 | Discharge: 2020-07-22 | Disposition: A | Discharge status: Home / Self Care | Payer: Medicare Other | Source: Home / Self Care | Attending: Emergency Medicine | Admitting: Emergency Medicine

## 2020-07-21 DIAGNOSIS — Z950 Presence of cardiac pacemaker: Secondary | ICD-10-CM | POA: Insufficient documentation

## 2020-07-21 DIAGNOSIS — Z79899 Other long term (current) drug therapy: Secondary | ICD-10-CM | POA: Insufficient documentation

## 2020-07-21 DIAGNOSIS — I10 Essential (primary) hypertension: Secondary | ICD-10-CM | POA: Insufficient documentation

## 2020-07-21 DIAGNOSIS — Z8546 Personal history of malignant neoplasm of prostate: Secondary | ICD-10-CM | POA: Insufficient documentation

## 2020-07-21 DIAGNOSIS — J449 Chronic obstructive pulmonary disease, unspecified: Secondary | ICD-10-CM | POA: Insufficient documentation

## 2020-07-21 DIAGNOSIS — N390 Urinary tract infection, site not specified: Secondary | ICD-10-CM

## 2020-07-21 DIAGNOSIS — F17210 Nicotine dependence, cigarettes, uncomplicated: Secondary | ICD-10-CM | POA: Insufficient documentation

## 2020-07-21 DIAGNOSIS — R319 Hematuria, unspecified: Secondary | ICD-10-CM

## 2020-07-21 DIAGNOSIS — T83518A Infection and inflammatory reaction due to other urinary catheter, initial encounter: Secondary | ICD-10-CM | POA: Diagnosis not present

## 2020-07-21 LAB — URINE CULTURE: Culture: >=100000 — AB

## 2020-07-21 LAB — URINALYSIS, COMPLETE (UACMP) WITH MICROSCOPIC
Bacteria, UA: NONE SEEN
Bilirubin Urine: NEGATIVE
Glucose, UA: 50 mg/dL — AB
Ketones, ur: NEGATIVE mg/dL
Nitrite: NEGATIVE
Protein, ur: 100 mg/dL — AB
RBC / HPF: >50 RBC/hpf — ABNORMAL HIGH (ref 0–5)
Specific Gravity, Urine: 1.021 (ref 1.005–1.030)
Squamous Epithelial / HPF: NONE SEEN (ref 0–5)
pH: 6 (ref 5.0–8.0)

## 2020-07-21 MED ORDER — CIPROFLOXACIN HCL 500 MG PO TABS: 500.0000 mg | ORAL_TABLET | Freq: Two times a day (BID) | ORAL | 0 refills | Status: AC

## 2020-07-21 MED ORDER — CIPROFLOXACIN HCL 500 MG PO TABS
500.0000 mg | ORAL_TABLET | Freq: Once | ORAL | Status: AC
  Administered 2020-07-22: 500 mg via ORAL
  Filled 2020-07-21: qty 1

## 2020-07-21 NOTE — ED Triage Notes (Signed)
Pt presents with foley in place. Reports hematuria onset 1700 today with pain. Reports foley placed 2 weeks ago d/t UTI and prostate enlargement.

## 2020-07-21 NOTE — ED Notes (Signed)
RN spoke with patient's daughter with verbal consent from patient. Update provided regarding treatment and plan of care and will continue to update as pertinent.

## 2020-07-21 NOTE — ED Provider Notes (Signed)
[facility] Emergency Department Provider Note  ____________________________________________   I have reviewed the triage vital signs and the nursing notes.   HISTORY  Chief Complaint foley problem    History limited by: Not Limited   HPI James Dickerson is a 80 y.o. male who presents to the emergency department today because of concerns for blood in his Foley catheter.  Patient states that he noticed the blood today.  He has noticed some clots.  He also complains of some suprapubic pain.  Patient was seen in the emergency department 3 days ago and diagnosed with a urinary tract infection.  Patient denies any fevers.  Denies any nausea or vomiting.   Records reviewed. Per medical record review patient has a history of ER visit three days ago, diagnosed with UTI. Urine culture grew out pseudomonas.   Past Medical History:  Diagnosis Date  . Back pain   . Cardiomegaly   . COPD (chronic obstructive pulmonary disease) ([place])   . Cystic kidney disease   . Depressive disorder   . GERD (gastroesophageal reflux disease)   . High cholesterol   . History of prostate cancer   . Hypertension   . Prostate cancer ([place]) 2018   Rad seeds  . Thyroid nodule     Patient Active Problem List   Diagnosis Date Noted  . AAA (abdominal aortic aneurysm) ([place]) 06/23/2020  . Status post surgery 06/22/2020  . Thoracic aortic aneurysm without rupture ([place]) 12/18/2019  . Respiratory failure ([place]) 07/11/2017  . Acute respiratory failure ([place]) 07/10/2017  . CAP (community acquired pneumonia) 07/10/2017  . Influenza A 07/10/2017  . COPD exacerbation ([place]) 07/10/2017  . HLD (hyperlipidemia) 03/09/2017  . Essential hypertension 08/14/2016  . 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 Bradycardia 08/05/2016    Past Surgical History:  Procedure Laterality Date  . ABDOMINAL AORTIC ENDOVASCULAR STENT GRAFT N/A 06/22/2020   Procedure: ABDOMINAL AORTIC ENDOVASCULAR STENT  GRAFT;  Surgeon: Cherre Robins, MD;  Location: [place];  Service: Vascular;  Laterality: N/A;  . APPENDECTOMY  1971  . CYSTOSCOPY WITH URETHRAL DILATATION N/A 06/22/2020   Procedure: CYSTOSCOPY WITH URETHRAL DILATATION;  Surgeon: Alexis Frock, MD;  Location: [place];  Service: Urology;  Laterality: N/A;  . INSERTION OF ILIAC STENT Right 06/22/2020   Procedure: INSERTION OF RIGHT EXTERNAL ILIAC ARTERY STENT;  Surgeon: Cherre Robins, MD;  Location: [place];  Service: Vascular;  Laterality: Right;  . INSERTION OF SUPRAPUBIC CATHETER N/A 06/22/2020   Procedure: INSERTION OF SUPRAPUBIC CATHETER;  Surgeon: Alexis Frock, MD;  Location: [place];  Service: Urology;  Laterality: N/A;  . PACEMAKER INSERTION N/A 08/06/2016   Procedure: INSERTION PACEMAKER;  Surgeon: Isaias Cowman, MD;  Location: [facility];  Service: Cardiovascular;  Laterality: N/A;    Prior to Admission medications   Medication Sig Start Date End Date Taking? Authorizing Provider  amLODipine (NORVASC) 5 MG tablet Take 5 mg by mouth every morning. 09/20/19   [provider]  aspirin EC 81 MG EC tablet Take 1 tablet (81 mg total) by mouth daily at 6 (six) AM. Swallow whole. 06/29/20   Setzer, Edman Circle, PA-C  atorvastatin (LIPITOR) 40 MG tablet Take 1 tablet (40 mg total) by mouth at bedtime. 06/17/18   Mayo, Pete Pelt, MD  budesonide-formoterol [facility]) 160-4.5 MCG/ACT inhaler Inhale 2 puffs into the lungs 2 (two) times daily. 02/13/18   Merlyn Lot, MD  Cholecalciferol (VITAMIN D3) 50 MCG (2000 UT) CHEW Chew 2,000 Units  by mouth daily.    [provider]  donepezil (ARICEPT) 5 MG tablet Take 5 mg by mouth at bedtime.    [provider]  ipratropium-albuterol (DUONEB) 0.5-2.5 (3) MG/3ML SOLN Inhale 3 mLs into the lungs every 6 (six) hours as needed (Wheezing). 01/21/20   [provider]  megestrol (MEGACE) 20 MG tablet Take 20 mg by mouth 2 (two) times daily. 06/06/20   [provider]   oxyCODONE (OXY IR/ROXICODONE) 5 MG immediate release tablet Take 1-2 tablets (5-10 mg total) by mouth every 4 (four) hours as needed for severe pain. 06/29/20   Marty Heck, MD  OXYGEN Inhale 2-3 L into the lungs as needed (at bedtime).    [provider]  pantoprazole (PROTONIX) 40 MG tablet Take 40 mg by mouth 2 (two) times daily. 10/18/19   [provider]  sulfamethoxazole-trimethoprim (BACTRIM DS) 800-160 MG tablet Take 1 tablet by mouth 2 (two) times daily. 07/18/20   Johnn Hai, PA-C  tamsulosin (FLOMAX) 0.4 MG CAPS capsule Take 0.4 mg by mouth daily. 10/14/19   [provider]  valsartan (DIOVAN) 160 MG tablet Take 160 mg by mouth daily. 05/04/18   [provider]    Allergies Patient has no known allergies.  History reviewed. No pertinent family history.  Social History Social History   Tobacco Use  . Smoking status: Current Every Day Smoker    Packs/day: 0.25    Years: 67.00    Pack years: 16.75    Types: Cigarettes  . Smokeless tobacco: Never Used  . Tobacco comment: 3 every other day  Vaping Use  . Vaping Use: Never used  Substance Use Topics  . Alcohol use: No  . Drug use: No    Review of Systems Constitutional: No fever/chills Eyes: No visual changes. ENT: No sore throat. Cardiovascular: Denies chest pain. Respiratory: Denies shortness of breath. Gastrointestinal: Positive for suprapubic pain. Genitourinary: Positive for blood in urine.  Musculoskeletal: Negative for back pain. Skin: Negative for rash. Neurological: Negative for headaches, focal weakness or numbness.  ____________________________________________   PHYSICAL EXAM:  VITAL SIGNS: ED Triage Vitals [07/21/20 2026]  Enc Vitals Group     BP 112/64     Pulse      Resp 18     Temp 98.1 F (36.7 C)     Temp Source Oral     SpO2 99 %     Weight 109 lb (49.4 kg)     Height 5\' 3"  (1.6 m)     Head Circumference      Peak Flow      Pain Score  8   Constitutional: Alert and oriented.  Eyes: Conjunctivae are normal.  ENT      Head: Normocephalic and atraumatic.      Nose: No congestion/rhinnorhea.      Mouth/Throat: Mucous membranes are moist.      Neck: No stridor. Hematological/Lymphatic/Immunilogical: No cervical lymphadenopathy. Cardiovascular: Normal rate, regular rhythm.  No murmurs, rubs, or gallops.  Respiratory: Normal respiratory effort without tachypnea nor retractions. Breath sounds are clear and equal bilaterally. No wheezes/rales/rhonchi. Gastrointestinal: Soft and non tender. No rebound. No guarding.  Genitourinary: Suprapubic and indwelling foley with some bloody urine in bag Musculoskeletal: Normal range of motion in all extremities. No lower extremity edema. Neurologic:  Normal speech and language. No gross focal neurologic deficits are appreciated.  Skin:  Skin is warm, dry and intact. No rash noted. Psychiatric: Mood and affect are normal. Speech and  behavior are normal. Patient exhibits appropriate insight and judgment.  ____________________________________________    LABS (pertinent positives/negatives)  UA cloudy, moderate hgb dipstick, trace leukocytes >50 rbc  ____________________________________________   EKG  None  ____________________________________________    RADIOLOGY  None  ____________________________________________   PROCEDURES  Procedures  ____________________________________________   INITIAL IMPRESSION / ASSESSMENT AND PLAN / ED COURSE  Pertinent labs & imaging results that were available during my care of the patient were reviewed by me and considered in my medical decision making (see chart for details).   Patient presented to the emergency department today because of concerns for hematuria.  Patient was seen in the emergency department a couple days ago diagnosed with a urinary tract affection.  At this time I do think urinary tract infection likely is the cause  of the hematuria.  Per chart review culture grew out Pseudomonas.  Will switch patient to ciprofloxacin for better Pseudomonas coverage.   ____________________________________________   FINAL CLINICAL IMPRESSION(S) / ED DIAGNOSES  Final diagnoses:  Hematuria, unspecified type  Lower urinary tract infection     Note: This dictation was prepared with Dragon dictation. Any transcriptional errors that result from this process are unintentional     Nance Pear, MD 07/21/20 2342

## 2020-07-21 NOTE — Discharge Instructions (Addendum)
Please seek medical attention for any high fevers, chest pain, shortness of breath, change in behavior, persistent vomiting, bloody stool or any other new or concerning symptoms.  

## 2020-07-22 ENCOUNTER — Other Ambulatory Visit: Payer: Self-pay | Admitting: Vascular Surgery

## 2020-07-22 ENCOUNTER — Inpatient Hospital Stay
Admission: EM | Admit: 2020-07-22 | Discharge: 2020-07-26 | DRG: 698 | Disposition: A | Discharge status: Home Health | Payer: Medicare Other | Attending: Internal Medicine | Admitting: Internal Medicine

## 2020-07-22 ENCOUNTER — Other Ambulatory Visit: Payer: Self-pay

## 2020-07-22 ENCOUNTER — Emergency Department: Payer: Medicare Other

## 2020-07-22 DIAGNOSIS — F17210 Nicotine dependence, cigarettes, uncomplicated: Secondary | ICD-10-CM | POA: Diagnosis present

## 2020-07-22 DIAGNOSIS — E43 Unspecified severe protein-calorie malnutrition: Secondary | ICD-10-CM | POA: Insufficient documentation

## 2020-07-22 DIAGNOSIS — Z7951 Long term (current) use of inhaled steroids: Secondary | ICD-10-CM

## 2020-07-22 DIAGNOSIS — R319 Hematuria, unspecified: Secondary | ICD-10-CM | POA: Diagnosis present

## 2020-07-22 DIAGNOSIS — Z79899 Other long term (current) drug therapy: Secondary | ICD-10-CM

## 2020-07-22 DIAGNOSIS — R7989 Other specified abnormal findings of blood chemistry: Secondary | ICD-10-CM | POA: Diagnosis not present

## 2020-07-22 DIAGNOSIS — N390 Urinary tract infection, site not specified: Secondary | ICD-10-CM | POA: Diagnosis present

## 2020-07-22 DIAGNOSIS — D509 Iron deficiency anemia, unspecified: Secondary | ICD-10-CM | POA: Diagnosis present

## 2020-07-22 DIAGNOSIS — J449 Chronic obstructive pulmonary disease, unspecified: Secondary | ICD-10-CM | POA: Diagnosis present

## 2020-07-22 DIAGNOSIS — K219 Gastro-esophageal reflux disease without esophagitis: Secondary | ICD-10-CM

## 2020-07-22 DIAGNOSIS — E7800 Pure hypercholesterolemia, unspecified: Secondary | ICD-10-CM | POA: Diagnosis present

## 2020-07-22 DIAGNOSIS — Z978 Presence of other specified devices: Secondary | ICD-10-CM | POA: Diagnosis not present

## 2020-07-22 DIAGNOSIS — D62 Acute posthemorrhagic anemia: Secondary | ICD-10-CM | POA: Diagnosis present

## 2020-07-22 DIAGNOSIS — I10 Essential (primary) hypertension: Secondary | ICD-10-CM | POA: Diagnosis present

## 2020-07-22 DIAGNOSIS — T83511D Infection and inflammatory reaction due to indwelling urethral catheter, subsequent encounter: Secondary | ICD-10-CM | POA: Diagnosis not present

## 2020-07-22 DIAGNOSIS — Z9981 Dependence on supplemental oxygen: Secondary | ICD-10-CM

## 2020-07-22 DIAGNOSIS — R54 Age-related physical debility: Secondary | ICD-10-CM | POA: Diagnosis present

## 2020-07-22 DIAGNOSIS — R310 Gross hematuria: Secondary | ICD-10-CM

## 2020-07-22 DIAGNOSIS — Z7982 Long term (current) use of aspirin: Secondary | ICD-10-CM

## 2020-07-22 DIAGNOSIS — Z6820 Body mass index (BMI) 20.0-20.9, adult: Secondary | ICD-10-CM

## 2020-07-22 DIAGNOSIS — R490 Dysphonia: Secondary | ICD-10-CM | POA: Diagnosis present

## 2020-07-22 DIAGNOSIS — I714 Abdominal aortic aneurysm, without rupture, unspecified: Secondary | ICD-10-CM

## 2020-07-22 DIAGNOSIS — R5381 Other malaise: Secondary | ICD-10-CM

## 2020-07-22 DIAGNOSIS — T83518A Infection and inflammatory reaction due to other urinary catheter, initial encounter: Principal | ICD-10-CM | POA: Diagnosis present

## 2020-07-22 DIAGNOSIS — E785 Hyperlipidemia, unspecified: Secondary | ICD-10-CM | POA: Diagnosis present

## 2020-07-22 DIAGNOSIS — Z950 Presence of cardiac pacemaker: Secondary | ICD-10-CM

## 2020-07-22 DIAGNOSIS — K5900 Constipation, unspecified: Secondary | ICD-10-CM | POA: Diagnosis present

## 2020-07-22 DIAGNOSIS — Y846 Urinary catheterization as the cause of abnormal reaction of the patient, or of later complication, without mention of misadventure at the time of the procedure: Secondary | ICD-10-CM | POA: Diagnosis present

## 2020-07-22 DIAGNOSIS — A419 Sepsis, unspecified organism: Secondary | ICD-10-CM

## 2020-07-22 DIAGNOSIS — B965 Pseudomonas (aeruginosa) (mallei) (pseudomallei) as the cause of diseases classified elsewhere: Secondary | ICD-10-CM | POA: Diagnosis present

## 2020-07-22 DIAGNOSIS — I441 Atrioventricular block, second degree: Secondary | ICD-10-CM | POA: Diagnosis present

## 2020-07-22 DIAGNOSIS — Z20822 Contact with and (suspected) exposure to covid-19: Secondary | ICD-10-CM | POA: Diagnosis present

## 2020-07-22 DIAGNOSIS — G929 Unspecified toxic encephalopathy: Secondary | ICD-10-CM | POA: Diagnosis not present

## 2020-07-22 DIAGNOSIS — Z8546 Personal history of malignant neoplasm of prostate: Secondary | ICD-10-CM

## 2020-07-22 LAB — LACTIC ACID, PLASMA
Lactic Acid, Venous: 4 mmol/L (ref 0.5–1.9)
Lactic Acid, Venous: 4.1 mmol/L (ref 0.5–1.9)

## 2020-07-22 LAB — CBC WITH DIFFERENTIAL/PLATELET
Abs Immature Granulocytes: 0.04 10*3/uL (ref 0.00–0.07)
Basophils Absolute: 0 10*3/uL (ref 0.0–0.1)
Basophils Relative: 0 %
Eosinophils Absolute: 0 10*3/uL (ref 0.0–0.5)
Eosinophils Relative: 0 %
HCT: 32.3 % — ABNORMAL LOW (ref 39.0–52.0)
Hemoglobin: 10.3 g/dL — ABNORMAL LOW (ref 13.0–17.0)
Immature Granulocytes: 1 %
Lymphocytes Relative: 6 %
Lymphs Abs: 0.4 10*3/uL — ABNORMAL LOW (ref 0.7–4.0)
MCH: 30.1 pg (ref 26.0–34.0)
MCHC: 31.9 g/dL (ref 30.0–36.0)
MCV: 94.4 fL (ref 80.0–100.0)
Monocytes Absolute: 0.4 10*3/uL (ref 0.1–1.0)
Monocytes Relative: 6 %
Neutro Abs: 6.6 10*3/uL (ref 1.7–7.7)
Neutrophils Relative %: 87 %
Platelets: 246 10*3/uL (ref 150–400)
RBC: 3.42 MIL/uL — ABNORMAL LOW (ref 4.22–5.81)
RDW: 13.7 % (ref 11.5–15.5)
WBC: 7.5 10*3/uL (ref 4.0–10.5)
nRBC: 0 % (ref 0.0–0.2)

## 2020-07-22 LAB — TROPONIN I (HIGH SENSITIVITY)
Troponin I (High Sensitivity): 14 ng/L (ref <18)
Troponin I (High Sensitivity): 20 ng/L — ABNORMAL HIGH (ref <18)

## 2020-07-22 LAB — COMPREHENSIVE METABOLIC PANEL
ALT: 15 U/L (ref 0–44)
AST: 18 U/L (ref 15–41)
Albumin: 3.2 g/dL — ABNORMAL LOW (ref 3.5–5.0)
Alkaline Phosphatase: 68 U/L (ref 38–126)
Anion gap: 12 (ref 5–15)
BUN: 19 mg/dL (ref 8–23)
CO2: 18 mmol/L — ABNORMAL LOW (ref 22–32)
Calcium: 9 mg/dL (ref 8.9–10.3)
Chloride: 109 mmol/L (ref 98–111)
Creatinine, Ser: 1.08 mg/dL (ref 0.61–1.24)
GFR, Estimated: >60 mL/min (ref >60)
Glucose, Bld: 184 mg/dL — ABNORMAL HIGH (ref 70–99)
Potassium: 3.8 mmol/L (ref 3.5–5.1)
Sodium: 139 mmol/L (ref 135–145)
Total Bilirubin: 0.8 mg/dL (ref 0.3–1.2)
Total Protein: 7.1 g/dL (ref 6.5–8.1)

## 2020-07-22 LAB — TYPE AND SCREEN
ABO/RH(D): A POS
Antibody Screen: NEGATIVE

## 2020-07-22 LAB — RESP PANEL BY RT-PCR (FLU A&B, COVID) ARPGX2
Influenza A by PCR: NEGATIVE
Influenza B by PCR: NEGATIVE
SARS Coronavirus 2 by RT PCR: NEGATIVE

## 2020-07-22 LAB — APTT: aPTT: 39 seconds — ABNORMAL HIGH (ref 24–36)

## 2020-07-22 LAB — BRAIN NATRIURETIC PEPTIDE: B Natriuretic Peptide: 63.4 pg/mL (ref 0.0–100.0)

## 2020-07-22 LAB — PROTIME-INR
INR: 1.2 (ref 0.8–1.2)
Prothrombin Time: 15.1 seconds (ref 11.4–15.2)

## 2020-07-22 MED ORDER — ATORVASTATIN CALCIUM 20 MG PO TABS
40.0000 mg | ORAL_TABLET | Freq: Every day | ORAL | Status: DC
  Administered 2020-07-23 – 2020-07-25 (×3): 40 mg via ORAL
  Filled 2020-07-22 (×3): qty 2

## 2020-07-22 MED ORDER — TAMSULOSIN HCL 0.4 MG PO CAPS
0.4000 mg | ORAL_CAPSULE | Freq: Every day | ORAL | Status: DC
  Administered 2020-07-23 – 2020-07-26 (×4): 0.4 mg via ORAL
  Filled 2020-07-22 (×4): qty 1

## 2020-07-22 MED ORDER — IPRATROPIUM-ALBUTEROL 0.5-2.5 (3) MG/3ML IN SOLN
3.0000 mL | Freq: Once | RESPIRATORY_TRACT | Status: AC
  Administered 2020-07-22: 3 mL via RESPIRATORY_TRACT
  Filled 2020-07-22 (×2): qty 3

## 2020-07-22 MED ORDER — ASPIRIN EC 81 MG PO TBEC
81.0000 mg | DELAYED_RELEASE_TABLET | Freq: Every day | ORAL | Status: DC
  Administered 2020-07-23 – 2020-07-26 (×4): 81 mg via ORAL
  Filled 2020-07-22 (×4): qty 1

## 2020-07-22 MED ORDER — MOMETASONE FURO-FORMOTEROL FUM 200-5 MCG/ACT IN AERO
2.0000 | INHALATION_SPRAY | Freq: Two times a day (BID) | RESPIRATORY_TRACT | Status: DC
  Administered 2020-07-23 – 2020-07-26 (×6): 2 via RESPIRATORY_TRACT
  Filled 2020-07-22 (×2): qty 8.8

## 2020-07-22 MED ORDER — IPRATROPIUM-ALBUTEROL 0.5-2.5 (3) MG/3ML IN SOLN: 3.0000 mL | Freq: Four times a day (QID) | RESPIRATORY_TRACT | Status: DC | PRN

## 2020-07-22 MED ORDER — PANTOPRAZOLE SODIUM 40 MG PO TBEC
40.0000 mg | DELAYED_RELEASE_TABLET | Freq: Two times a day (BID) | ORAL | Status: DC
  Administered 2020-07-23 – 2020-07-26 (×7): 40 mg via ORAL
  Filled 2020-07-22 (×7): qty 1

## 2020-07-22 MED ORDER — SODIUM CHLORIDE 0.9 % IV BOLUS
500.0000 mL | Freq: Once | INTRAVENOUS | Status: AC
  Administered 2020-07-22: 500 mL via INTRAVENOUS

## 2020-07-22 MED ORDER — OXYBUTYNIN CHLORIDE 5 MG PO TABS
5.0000 mg | ORAL_TABLET | Freq: Once | ORAL | Status: DC
  Filled 2020-07-22: qty 1

## 2020-07-22 MED ORDER — ACETAMINOPHEN 650 MG RE SUPP: 650.0000 mg | Freq: Four times a day (QID) | RECTAL | Status: DC | PRN

## 2020-07-22 MED ORDER — SODIUM CHLORIDE 0.9% FLUSH
3.0000 mL | Freq: Two times a day (BID) | INTRAVENOUS | Status: DC
  Administered 2020-07-23 – 2020-07-26 (×8): 3 mL via INTRAVENOUS

## 2020-07-22 MED ORDER — PIPERACILLIN-TAZOBACTAM 3.375 G IVPB 30 MIN
3.3750 g | Freq: Once | INTRAVENOUS | Status: AC
  Administered 2020-07-22: 3.375 g via INTRAVENOUS
  Filled 2020-07-22: qty 50

## 2020-07-22 MED ORDER — ACETAMINOPHEN 325 MG PO TABS
650.0000 mg | ORAL_TABLET | Freq: Four times a day (QID) | ORAL | Status: DC | PRN
  Administered 2020-07-23 – 2020-07-24 (×2): 650 mg via ORAL
  Filled 2020-07-22 (×2): qty 2

## 2020-07-22 MED ORDER — OXYCODONE HCL 5 MG PO TABS
2.5000 mg | ORAL_TABLET | Freq: Once | ORAL | Status: AC
  Administered 2020-07-22: 2.5 mg via ORAL
  Filled 2020-07-22: qty 1

## 2020-07-22 MED ORDER — SODIUM CHLORIDE 0.9 % IV BOLUS
1000.0000 mL | Freq: Once | INTRAVENOUS | Status: AC
  Administered 2020-07-22: 1000 mL via INTRAVENOUS

## 2020-07-22 MED ORDER — OXYCODONE HCL 5 MG PO TABS: 5.0000 mg | ORAL_TABLET | ORAL | Status: DC | PRN

## 2020-07-22 MED ORDER — DONEPEZIL HCL 5 MG PO TABS
5.0000 mg | ORAL_TABLET | Freq: Every day | ORAL | Status: DC
  Administered 2020-07-23 – 2020-07-25 (×3): 5 mg via ORAL
  Filled 2020-07-22 (×3): qty 1

## 2020-07-22 MED ORDER — AMLODIPINE BESYLATE 5 MG PO TABS
5.0000 mg | ORAL_TABLET | Freq: Every morning | ORAL | Status: DC
  Administered 2020-07-23: 5 mg via ORAL
  Filled 2020-07-22 (×2): qty 1

## 2020-07-22 MED ORDER — SODIUM CHLORIDE 0.9 % IV SOLN: INTRAVENOUS | Status: DC

## 2020-07-22 NOTE — Sepsis Progress Note (Signed)
Monitoring for code sepsis protocol. 

## 2020-07-22 NOTE — ED Notes (Signed)
duoneb not in pyxis.  Pharmacy called to send.

## 2020-07-22 NOTE — Sepsis Progress Note (Signed)
Notified provider and bedside nurse of need to order and draw repeat lactic acid and blood cultures.

## 2020-07-22 NOTE — ED Provider Notes (Signed)
[facility] Emergency Department Provider Note  ____________________________________________   Event Date/Time   First MD Initiated Contact with Patient 07/22/20 1949     (approximate)  I have reviewed the triage vital signs and the nursing notes.   HISTORY  Chief Complaint urinary problem    HPI James Dickerson is a 80 y.o. male with Foley who comes in for pain around his Foley, hematuria and Foley leaking.  On review of records patient was seen on 2/24 at that time it was stated that patient's catheter was last replaced 2/20 and he was having pain.  Patient was treated for UTI.  Patient came back yesterday 2/27 with hematuria.  Patient antibiotics were switched to Cipro given a grew Pseudomonas.  Patient states that he has had worsening pain around his penis with continued hematuria and blood leaking out around his penis that has been constant, nothing makes better, nothing makes it worse.  He is on any blood thinners.  Patient to note also has a suprapubic catheter that is not being used currently.  On review of records Abdominal aortic endovascular stent graft, insertion of right external iliac artery stent. Procedure complicated by urethral injury during placement of foley. This required Insertion of suprapubic catheter and cystoscopy with urethral dilatation by Urologist Dr. Alexis Frock.          Past Medical History:  Diagnosis Date  . Back pain   . Cardiomegaly   . COPD (chronic obstructive pulmonary disease) ([place])   . Cystic kidney disease   . Depressive disorder   . GERD (gastroesophageal reflux disease)   . High cholesterol   . History of prostate cancer   . Hypertension   . Prostate cancer ([place]) 2018   Rad seeds  . Thyroid nodule     Patient Active Problem List   Diagnosis Date Noted  . AAA (abdominal aortic aneurysm) ([place]) 06/23/2020  . Status post surgery 06/22/2020  . Thoracic aortic aneurysm without rupture ([place]) 12/18/2019   . Respiratory failure ([place]) 07/11/2017  . Acute respiratory failure ([place]) 07/10/2017  . CAP (community acquired pneumonia) 07/10/2017  . Influenza A 07/10/2017  . COPD exacerbation ([employer]) 07/10/2017  . HLD (hyperlipidemia) 03/09/2017  . Essential hypertension 08/14/2016  . 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 Bradycardia 08/05/2016    Past Surgical History:  Procedure Laterality Date  . ABDOMINAL AORTIC ENDOVASCULAR STENT GRAFT N/A 06/22/2020   Procedure: ABDOMINAL AORTIC ENDOVASCULAR STENT GRAFT;  Surgeon: Cherre Robins, MD;  Location: [place];  Service: Vascular;  Laterality: N/A;  . APPENDECTOMY  1971  . CYSTOSCOPY WITH URETHRAL DILATATION N/A 06/22/2020   Procedure: CYSTOSCOPY WITH URETHRAL DILATATION;  Surgeon: Alexis Frock, MD;  Location: [place];  Service: Urology;  Laterality: N/A;  . INSERTION OF ILIAC STENT Right 06/22/2020   Procedure: INSERTION OF RIGHT EXTERNAL ILIAC ARTERY STENT;  Surgeon: Cherre Robins, MD;  Location: [place];  Service: Vascular;  Laterality: Right;  . INSERTION OF SUPRAPUBIC CATHETER N/A 06/22/2020   Procedure: INSERTION OF SUPRAPUBIC CATHETER;  Surgeon: Alexis Frock, MD;  Location: [place];  Service: Urology;  Laterality: N/A;  . PACEMAKER INSERTION N/A 08/06/2016   Procedure: INSERTION PACEMAKER;  Surgeon: Isaias Cowman, MD;  Location: [facility];  Service: Cardiovascular;  Laterality: N/A;    Prior to Admission medications   Medication Sig Start Date End Date Taking? Authorizing Provider  amLODipine (NORVASC) 5 MG tablet Take 5 mg by mouth every morning. 09/20/19  [provider]  aspirin EC 81 MG EC tablet Take 1 tablet (81 mg total) by mouth daily at 6 (six) AM. Swallow whole. 06/29/20   Setzer, Edman Circle, PA-C  atorvastatin (LIPITOR) 40 MG tablet Take 1 tablet (40 mg total) by mouth at bedtime. 06/17/18   Mayo, Pete Pelt, MD  budesonide-formoterol [facility]) 160-4.5 MCG/ACT inhaler Inhale 2 puffs into the lungs 2 (two)  times daily. 02/13/18   Merlyn Lot, MD  Cholecalciferol (VITAMIN D3) 50 MCG (2000 UT) CHEW Chew 2,000 Units by mouth daily.    [provider]  ciprofloxacin (CIPRO) 500 MG tablet Take 1 tablet (500 mg total) by mouth 2 (two) times daily for 5 days. 07/21/20 07/26/20  Nance Pear, MD  donepezil (ARICEPT) 5 MG tablet Take 5 mg by mouth at bedtime.    [provider]  ipratropium-albuterol (DUONEB) 0.5-2.5 (3) MG/3ML SOLN Inhale 3 mLs into the lungs every 6 (six) hours as needed (Wheezing). 01/21/20   [provider]  megestrol (MEGACE) 20 MG tablet Take 20 mg by mouth 2 (two) times daily. 06/06/20   [provider]  oxyCODONE (OXY IR/ROXICODONE) 5 MG immediate release tablet Take 1-2 tablets (5-10 mg total) by mouth every 4 (four) hours as needed for severe pain. 06/29/20   Marty Heck, MD  OXYGEN Inhale 2-3 L into the lungs as needed (at bedtime).    [provider]  pantoprazole (PROTONIX) 40 MG tablet Take 40 mg by mouth 2 (two) times daily. 10/18/19   [provider]  sulfamethoxazole-trimethoprim (BACTRIM DS) 800-160 MG tablet Take 1 tablet by mouth 2 (two) times daily. 07/18/20   Johnn Hai, PA-C  tamsulosin (FLOMAX) 0.4 MG CAPS capsule Take 0.4 mg by mouth daily. 10/14/19   [provider]  valsartan (DIOVAN) 160 MG tablet Take 160 mg by mouth daily. 05/04/18   [provider]    Allergies Patient has no known allergies.  No family history on file.  Social History Social History   Tobacco Use  . Smoking status: Current Every Day Smoker    Packs/day: 0.25    Years: 67.00    Pack years: 16.75    Types: Cigarettes  . Smokeless tobacco: Never Used  . Tobacco comment: 3 every other day  Vaping Use  . Vaping Use: Never used  Substance Use Topics  . Alcohol use: No  . Drug use: No      Review of Systems Constitutional: No fever/chills Eyes: No visual changes. ENT: No sore  throat. Cardiovascular: Denies chest pain. Respiratory: Denies shortness of breath. Gastrointestinal: No abdominal pain.  No nausea, no vomiting.  No diarrhea.  No constipation. Genitourinary: Dysuria, hematuria Musculoskeletal: Negative for back pain. Skin: Negative for rash. Neurological: Negative for headaches, focal weakness or numbness. All other ROS negative ____________________________________________   PHYSICAL EXAM:  VITAL SIGNS: ED Triage Vitals  Enc Vitals Group     BP 07/22/20 2003 115/64     Pulse Rate 07/22/20 2003 94     Resp 07/22/20 2003 (!) 24     Temp 07/22/20 2003 98.4 F (36.9 C)     Temp Source 07/22/20 2003 Oral     SpO2 07/22/20 2003 99 %     Weight 07/22/20 1956 109 lb (49.4 kg)     Height 07/22/20 1956 5\' 3"  (1.6 m)     Head Circumference --      Peak Flow --      Pain Score 07/22/20 1956 9  Pain Loc --      Pain Edu? --      Excl. in [place]? --     Constitutional: Alert and oriented. Well appearing and in no acute distress. Eyes: Conjunctivae are normal. EOMI. Head: Atraumatic. Nose: No congestion/rhinnorhea. Mouth/Throat: Mucous membranes are moist.   Neck: No stridor. Trachea Midline. FROM Cardiovascular: Normal rate, regular rhythm. Grossly normal heart sounds.  Good peripheral circulation. Respiratory: Normal respiratory effort.  No retractions. Lungs CTAB. Gastrointestinal: Soft and nontender. No distention. No abdominal bruits.  Musculoskeletal: No lower extremity tenderness nor edema.  No joint effusions. Neurologic:  Normal speech and language. No gross focal neurologic deficits are appreciated.  Skin:  Skin is warm, dry and intact. No rash noted. Psychiatric: Mood and affect are normal. Speech and behavior are normal. GU: Foley catheter with blood leaking around it, suprapubic catheter as well pain reported in his penis where the catheter is  ____________________________________________   LABS (all labs ordered are listed, but  only abnormal results are displayed)  Labs Reviewed  CBC WITH DIFFERENTIAL/PLATELET - Abnormal; Notable for the following components:      Result Value   RBC 3.42 (*)    Hemoglobin 10.3 (*)    HCT 32.3 (*)    Lymphs Abs 0.4 (*)    All other components within normal limits  COMPREHENSIVE METABOLIC PANEL - Abnormal; Notable for the following components:   CO2 18 (*)    Glucose, Bld 184 (*)    Albumin 3.2 (*)    All other components within normal limits  APTT - Abnormal; Notable for the following components:   aPTT 39 (*)    All other components within normal limits  LACTIC ACID, PLASMA - Abnormal; Notable for the following components:   Lactic Acid, Venous 4.0 (*)    All other components within normal limits  RESP PANEL BY RT-PCR (FLU A&B, COVID) ARPGX2  URINE CULTURE  SARS CORONAVIRUS 2 (TAT 6-24 HRS)  PROTIME-INR  BRAIN NATRIURETIC PEPTIDE  URINALYSIS, COMPLETE (UACMP) WITH MICROSCOPIC  LACTIC ACID, PLASMA  TYPE AND SCREEN  TYPE AND SCREEN  TROPONIN I (HIGH SENSITIVITY)  TROPONIN I (HIGH SENSITIVITY)   ____________________________________________    RADIOLOGY Robert Bellow, personally viewed and evaluated these images (plain radiographs) as part of my medical decision making, as well as reviewing the written report by the radiologist.  ED MD interpretation: No pneumonia  Official radiology report(s): DG Chest Portable 1 View  Result Date: 07/22/2020 CLINICAL DATA:  Urinary catheter infection, EXAM: PORTABLE CHEST 1 VIEW COMPARISON:  06/23/2020 FINDINGS: Single frontal view of the chest demonstrates stable dual lead pacemaker. Continued ectasia and atherosclerosis of the thoracic aorta. Cardiac silhouette is unremarkable. No acute airspace disease, effusion, or pneumothorax. No acute bony abnormalities. IMPRESSION: 1. Stable exam, no acute process. Electronically Signed   By: Randa Ngo M.D.   On: 07/22/2020 20:10     ____________________________________________   PROCEDURES  Procedure(s) performed (including Critical Care):  .Critical Care Performed by: Vanessa Readlyn, MD Authorized by: Vanessa Arkdale, MD   Critical care provider statement:    Critical care time (minutes):  35   Critical care was necessary to treat or prevent imminent or life-threatening deterioration of the following conditions:  Sepsis   Critical care was time spent personally by me on the following activities:  Discussions with consultants, evaluation of patient's response to treatment, examination of patient, ordering and performing treatments and interventions, ordering and review of laboratory studies, ordering and  review of radiographic studies, pulse oximetry, re-evaluation of patient's condition, obtaining history from patient or surrogate and review of old charts     ____________________________________________   INITIAL IMPRESSION / [place] / ED COURSE  James Dickerson was evaluated in Emergency Department on 07/22/2020 for the symptoms described in the history of present illness. He was evaluated in the context of the global COVID-19 pandemic, which necessitated consideration that the patient might be at risk for infection with the SARS-CoV-2 virus that causes COVID-19. Institutional protocols and algorithms that pertain to the evaluation of patients at risk for COVID-19 are in a state of rapid change based on information released by regulatory bodies including the CDC and federal and state organizations. These policies and algorithms were followed during the patient's care in the ED.    Patient is an 80 year old who comes in with hematuria, and dysuria.  Patient is on Cipro.  Labs were ordered evaluate for Electra abnormalities, AKI.  Bladder scan not show signs of retention do not need to put a three-way Foley catheter in.  Patient did have a little wheezing initially and gave a DuoNeb and his wheezing since  resolved.   Patient is less concerning for significantly elevated lactate of 4.  I start fluid resuscitation at 30 cc/kg.  His heart rate slightly elevated but he is afebrile at this time.  Reviewed his prior culture data and will treat with Zosyn given concern of the oral antibiotic has not been as effective.  Will discuss possible team for admission.  On repeat evaluation after the fluids patient's blood pressure is still stable and does not require pressors  _________________________________________   FINAL CLINICAL IMPRESSION(S) / ED DIAGNOSES   Final diagnoses:  Urinary tract infection without hematuria, site unspecified  Sepsis, due to unspecified organism, unspecified whether acute organ dysfunction present ([place])      MEDICATIONS GIVEN DURING THIS VISIT:  Medications  oxybutynin (DITROPAN) tablet 5 mg (has no administration in time range)  sodium chloride 0.9 % bolus 500 mL (has no administration in time range)  piperacillin-tazobactam (ZOSYN) IVPB 3.375 g (has no administration in time range)  ipratropium-albuterol (DUONEB) 0.5-2.5 (3) MG/3ML nebulizer solution 3 mL (3 mLs Nebulization Given 07/22/20 2215)  sodium chloride 0.9 % bolus 1,000 mL (1,000 mLs Intravenous New Bag/Given 07/22/20 2203)  oxyCODONE (Oxy IR/ROXICODONE) immediate release tablet 2.5 mg (2.5 mg Oral Given 07/22/20 2212)     ED Discharge Orders    None       Note:  This document was prepared using Dragon voice recognition software and may include unintentional dictation errors.   Vanessa [place], MD 07/22/20 2221

## 2020-07-22 NOTE — H&P (Addendum)
History and Physical   James Dickerson AJO:[id number] DOB: April 20, 1941 DOA: 07/22/2020  PCP: Lorelee Market, MD   Patient coming from: Home  Chief Complaint: Hematuria, dysuria  HPI: James Dickerson is a 80 y.o. male with medical history significant of thoracic aortic aneurysm, abdominal aortic aneurysm status post stenting, bradycardia, COPD, hyperlipidemia, secondary heart block, depression, GERD, prostate cancer who presents with ongoing UTI symptoms including dysuria and hematuria.  Also with issues with Foley catheter relating to this.  Brief Background, he had an aortic aneurysm repair in late January and during the procedure he had a traumatic Foley placement resulting in the need for a suprapubic catheter to be placed.  Since that time he stopped using his suprapubic catheter and discontinued using a Foley catheter.  He was originally evaluated by Dr. Tresa Moore with urology.  Recently, he has been seen for UTI symptoms.  Per reports he had his Foley last replaced on 2/20 and then he came in to be seen on 2/24 and began treatment for a UTI.  He came in again on 2/27 due to worsening hematuria and at that time his antibiotics were changed his Cipro as his cultures from 2/24 grew out Pseudomonas.  He comes in today with worsening pain and hematuria with the pain being constant and nothing providing him relief.  He has apparently failed outpatient antibiotics.  He reports worsening suprapubic pain, he also reports some ongoing leg twitching. He denies fever, chills, chest pain, shortness breath, constipation, diarrhea, nausea, vomiting.  ED Course: Vital signs in the ED were stable.  Lab work-up showed CMP with bicarb 18, glucose 184, albumin [place] 3.2.  CBC showed hemoglobin at 10.3 which is down from his baseline.  PT, PTT, INR normal other than PTT of 39.  Which is similar to previous.  Troponin trend 14, 20.  Lactic acid 4, and then 4 again but this was prior to receiving fluids.  BNP normal.   Respiratory panel flu COVID negative.  Urinalysis, urine culture, blood cultures pending.  Patient given 1.5 L of fluids in the ED as well as oxy for pain.  Review of Systems: As per HPI otherwise all other systems reviewed and are negative.  Past Medical History:  Diagnosis Date  . Back pain   . Cardiomegaly   . COPD (chronic obstructive pulmonary disease) ([place])   . Cystic kidney disease   . Depressive disorder   . GERD (gastroesophageal reflux disease)   . High cholesterol   . History of prostate cancer   . Hypertension   . Prostate cancer ([place]) 2018   Rad seeds  . Thyroid nodule     Past Surgical History:  Procedure Laterality Date  . ABDOMINAL AORTIC ENDOVASCULAR STENT GRAFT N/A 06/22/2020   Procedure: ABDOMINAL AORTIC ENDOVASCULAR STENT GRAFT;  Surgeon: Cherre Robins, MD;  Location: [facility];  Service: Vascular;  Laterality: N/A;  . APPENDECTOMY  1971  . CYSTOSCOPY WITH URETHRAL DILATATION N/A 06/22/2020   Procedure: CYSTOSCOPY WITH URETHRAL DILATATION;  Surgeon: Alexis Frock, MD;  Location: [facility];  Service: Urology;  Laterality: N/A;  . INSERTION OF ILIAC STENT Right 06/22/2020   Procedure: INSERTION OF RIGHT EXTERNAL ILIAC ARTERY STENT;  Surgeon: Cherre Robins, MD;  Location: [facility];  Service: Vascular;  Laterality: Right;  . INSERTION OF SUPRAPUBIC CATHETER N/A 06/22/2020   Procedure: INSERTION OF SUPRAPUBIC CATHETER;  Surgeon: Alexis Frock, MD;  Location: [facility];  Service: Urology;  Laterality: N/A;  . PACEMAKER INSERTION N/A 08/06/2016  Procedure: INSERTION PACEMAKER;  Surgeon: Isaias Cowman, MD;  Location: [facility];  Service: Cardiovascular;  Laterality: N/A;    Social History  reports that he has been smoking cigarettes. He has a 16.75 pack-year smoking history. He has never used smokeless tobacco. He reports that he does not drink alcohol and does not use drugs.  No Known Allergies  No family history on file. Reviewed on admission  Prior to Admission  medications   Medication Sig Start Date End Date Taking? Authorizing Provider  amLODipine (NORVASC) 5 MG tablet Take 5 mg by mouth every morning. 09/20/19   [provider]  aspirin EC 81 MG EC tablet Take 1 tablet (81 mg total) by mouth daily at 6 (six) AM. Swallow whole. 06/29/20   Setzer, Edman Circle, PA-C  atorvastatin (LIPITOR) 40 MG tablet Take 1 tablet (40 mg total) by mouth at bedtime. 06/17/18   Mayo, Pete Pelt, MD  budesonide-formoterol [facility]) 160-4.5 MCG/ACT inhaler Inhale 2 puffs into the lungs 2 (two) times daily. 02/13/18   Merlyn Lot, MD  Cholecalciferol (VITAMIN D3) 50 MCG (2000 UT) CHEW Chew 2,000 Units by mouth daily.    [provider]  ciprofloxacin (CIPRO) 500 MG tablet Take 1 tablet (500 mg total) by mouth 2 (two) times daily for 5 days. 07/21/20 07/26/20  Nance Pear, MD  donepezil (ARICEPT) 5 MG tablet Take 5 mg by mouth at bedtime.    [provider]  ipratropium-albuterol (DUONEB) 0.5-2.5 (3) MG/3ML SOLN Inhale 3 mLs into the lungs every 6 (six) hours as needed (Wheezing). 01/21/20   [provider]  megestrol (MEGACE) 20 MG tablet Take 20 mg by mouth 2 (two) times daily. 06/06/20   [provider]  oxyCODONE (OXY IR/ROXICODONE) 5 MG immediate release tablet Take 1-2 tablets (5-10 mg total) by mouth every 4 (four) hours as needed for severe pain. 06/29/20   Marty Heck, MD  OXYGEN Inhale 2-3 L into the lungs as needed (at bedtime).    [provider]  pantoprazole (PROTONIX) 40 MG tablet Take 40 mg by mouth 2 (two) times daily. 10/18/19   [provider]  sulfamethoxazole-trimethoprim (BACTRIM DS) 800-160 MG tablet Take 1 tablet by mouth 2 (two) times daily. 07/18/20   Johnn Hai, PA-C  tamsulosin (FLOMAX) 0.4 MG CAPS capsule Take 0.4 mg by mouth daily. 10/14/19   [provider]  valsartan (DIOVAN) 160 MG tablet Take 160 mg by mouth daily. 05/04/18   [provider]     Physical Exam: Vitals:   07/22/20 2030 07/22/20 2100 07/22/20 2130 07/22/20 2200  BP: 118/70 116/64 123/76 118/70  Pulse: 92 99 93   Resp: 18 17 (!) 21 16  Temp:      TempSrc:      SpO2: 98% 98% 99% 99%  Weight:      Height:       Physical Exam Constitutional:      General: He is not in acute distress.    Appearance: Normal appearance.  HENT:     Head: Normocephalic and atraumatic.     Mouth/Throat:     Mouth: Mucous membranes are moist.     Pharynx: Oropharynx is clear.  Eyes:     Extraocular Movements: Extraocular movements intact.     Pupils: Pupils are equal, round, and reactive to light.  Cardiovascular:     Rate and Rhythm: Normal rate and regular rhythm.     Pulses: Normal pulses.     Heart sounds: Normal heart  sounds.  Pulmonary:     Effort: Pulmonary effort is normal. No respiratory distress.     Breath sounds: Normal breath sounds.  Abdominal:     General: Bowel sounds are normal. There is no distension.     Palpations: Abdomen is soft.     Tenderness: There is abdominal tenderness (Suprapubic).  Genitourinary:    Comments: Gross hematuria in Foley bag Musculoskeletal:        General: No swelling or deformity.  Skin:    General: Skin is warm and dry.  Neurological:     General: No focal deficit present.     Mental Status: Mental status is at baseline.    Labs on Admission: I have personally reviewed following labs and imaging studies  CBC: Recent Labs  Lab 07/18/20 1213 07/22/20 2017  WBC 7.0 7.5  NEUTROABS  --  6.6  HGB 11.5* 10.3*  HCT 34.2* 32.3*  MCV 92.9 94.4  PLT 257 536    Basic Metabolic Panel: Recent Labs  Lab 07/18/20 1213 07/22/20 2017  NA 137 139  K 4.0 3.8  CL 105 109  CO2 24 18*  GLUCOSE 103* 184*  BUN 20 19  CREATININE 1.09 1.08  CALCIUM 9.3 9.0    GFR: Estimated Creatinine Clearance: 38.1 mL/min (by C-G formula based on SCr of 1.08 mg/dL).  Liver Function Tests: Recent Labs  Lab 07/22/20 2017  AST 18   ALT 15  ALKPHOS 68  BILITOT 0.8  PROT 7.1  ALBUMIN 3.2*    Urine analysis:    Component Value Date/Time   COLORURINE YELLOW (A) 07/21/2020 2130   APPEARANCEUR CLOUDY (A) 07/21/2020 2130   APPEARANCEUR Clear 08/09/2011 1744   LABSPEC 1.021 07/21/2020 2130   LABSPEC 1.015 08/09/2011 1744   PHURINE 6.0 07/21/2020 2130   GLUCOSEU 50 (A) 07/21/2020 2130   GLUCOSEU Negative 08/09/2011 1744   HGBUR MODERATE (A) 07/21/2020 2130   BILIRUBINUR NEGATIVE 07/21/2020 2130   BILIRUBINUR Negative 08/09/2011 [place] NEGATIVE 07/21/2020 2130   PROTEINUR 100 (A) 07/21/2020 2130   NITRITE NEGATIVE 07/21/2020 2130   LEUKOCYTESUR TRACE (A) 07/21/2020 2130   LEUKOCYTESUR 1+ 08/09/2011 1744    Radiological Exams on Admission: DG Chest Portable 1 View  Result Date: 07/22/2020 CLINICAL DATA:  Urinary catheter infection, EXAM: PORTABLE CHEST 1 VIEW COMPARISON:  06/23/2020 FINDINGS: Single frontal view of the chest demonstrates stable dual lead pacemaker. Continued ectasia and atherosclerosis of the thoracic aorta. Cardiac silhouette is unremarkable. No acute airspace disease, effusion, or pneumothorax. No acute bony abnormalities. IMPRESSION: 1. Stable exam, no acute process. Electronically Signed   By: Randa Ngo M.D.   On: 07/22/2020 20:10    EKG: Not yet performed  Assessment/Plan Principal Problem:   UTI (urinary tract infection) Active Problems:   COPD (chronic obstructive pulmonary disease) (HCC)   Essential hypertension   HLD (hyperlipidemia)   Foley catheter in place   GERD (gastroesophageal reflux disease)   Elevated lactic acid level   Gross hematuria  UTI Foley in place Gross hematuria > Of note, does have a History of prostate cancer about 6 yrs ago > Patient with multiple visits for worsening UTI symptoms of dysuria and hematuria in the setting of Foley catheter and suprapubic catheter in place following traumatic catheter placement during aortic aneurysm surgery  at the end of January. > He was originally on Bactrim which was switched to Cipro after he was noted to be growing Pseudomonas on urine culture. > Symptoms have continued to  worsen on Cipro and he is being admitted for IV antibiotics, in the setting of elevated lactic acid to 4. > As above lactic acid was 4 no other SIRS criteria, repeat was also for but this was before receiving IV fluids we will continue to trend. - Monitor on telemetry on MedSurg floor - Will benefit from urology consult in the morning - Continue Zosyn for now - Trend fever curve and WBC - Follow-up urinalysis, urine cultures, blood cultures - Trend lactic acid - Continue IV fluids at 100 cc/h will bolus if needed for lactic acid - Continue home tamsulosin  Anemia > In the setting of his hematuria as above likely blood loss > Also notes some leg twitching, this could be related to iron deficiency if present - Check iron, ferritin, TIBC  COPD - Home Symbicort replaced with formulary Dulera - As needed duo nebs  Hyperlipidemia - Continue home statin   Hypertension  - Continue home amlodipine  - Holding home valsartan for now   Secondary heart block - Status post pacemaker  GERD - Continue home PPI  DVT prophylaxis: SCDs for now considering ongoing hematuria  Code Status:   Full  Family Communication:  None on admission Disposition Plan:   Patient is from:  Home  Anticipated DC to:  Home  Anticipated DC date:  1 to 2 days  Anticipated DC barriers: None  Consults called:  None, will benefit from neurology consult in the morning Admission status:  Observation, MedSurg with telemetry   Severity of Illness: The appropriate patient status for this patient is OBSERVATION. Observation status is judged to be reasonable and necessary in order to provide the required intensity of service to ensure the patient's safety. The patient's presenting symptoms, physical exam findings, and initial radiographic and  laboratory data in the context of their medical condition is felt to place them at decreased risk for further clinical deterioration. Furthermore, it is anticipated that the patient will be medically stable for discharge from the hospital within 2 midnights of admission. The following factors support the patient status of observation.   " The patient's presenting symptoms include dysuria, hematuria. " The physical exam findings include gross hematuria in Foley bag, suprapubic tenderness. " The initial radiographic and laboratory data are hemoglobin 10.3 which is new, lactic acid 4.   Marcelyn Bruins MD [facility]  How to contact the [facility] Attending or Consulting provider [place] or covering provider during after hours [place], for this patient?   1. Check the care team in [facility] and look for a) attending/consulting [facility] provider listed and b) the [facility] team listed 2. Log into [email or web address] and use [place]'s universal password to access. If you do not have the password, please contact the hospital operator. 3. Locate the [facility] provider you are looking for under [facility] and page to a number that you can be directly reached. 4. If you still have difficulty reaching the provider, please page the [facility] (Director on Call) for the Hospitalists listed on amion for assistance.  07/22/2020, 11:44 PM

## 2020-07-22 NOTE — ED Notes (Signed)
Confirmed with provider that zosyn is not to be started until urine is obtained.

## 2020-07-22 NOTE — ED Notes (Signed)
Bladder scan 141 mL

## 2020-07-23 ENCOUNTER — Encounter: Payer: Self-pay | Admitting: Internal Medicine

## 2020-07-23 ENCOUNTER — Encounter: Payer: Medicare Other | Admitting: Vascular Surgery

## 2020-07-23 ENCOUNTER — Observation Stay: Payer: Medicare Other

## 2020-07-23 DIAGNOSIS — R1030 Lower abdominal pain, unspecified: Secondary | ICD-10-CM

## 2020-07-23 DIAGNOSIS — F17210 Nicotine dependence, cigarettes, uncomplicated: Secondary | ICD-10-CM | POA: Diagnosis present

## 2020-07-23 DIAGNOSIS — Z8546 Personal history of malignant neoplasm of prostate: Secondary | ICD-10-CM | POA: Diagnosis not present

## 2020-07-23 DIAGNOSIS — Z7982 Long term (current) use of aspirin: Secondary | ICD-10-CM | POA: Diagnosis not present

## 2020-07-23 DIAGNOSIS — R319 Hematuria, unspecified: Secondary | ICD-10-CM

## 2020-07-23 DIAGNOSIS — R7989 Other specified abnormal findings of blood chemistry: Secondary | ICD-10-CM | POA: Diagnosis present

## 2020-07-23 DIAGNOSIS — E43 Unspecified severe protein-calorie malnutrition: Secondary | ICD-10-CM | POA: Diagnosis present

## 2020-07-23 DIAGNOSIS — I10 Essential (primary) hypertension: Secondary | ICD-10-CM | POA: Diagnosis present

## 2020-07-23 DIAGNOSIS — B965 Pseudomonas (aeruginosa) (mallei) (pseudomallei) as the cause of diseases classified elsewhere: Secondary | ICD-10-CM | POA: Diagnosis present

## 2020-07-23 DIAGNOSIS — R310 Gross hematuria: Secondary | ICD-10-CM | POA: Diagnosis present

## 2020-07-23 DIAGNOSIS — Z9981 Dependence on supplemental oxygen: Secondary | ICD-10-CM | POA: Diagnosis not present

## 2020-07-23 DIAGNOSIS — E785 Hyperlipidemia, unspecified: Secondary | ICD-10-CM | POA: Diagnosis present

## 2020-07-23 DIAGNOSIS — Z7951 Long term (current) use of inhaled steroids: Secondary | ICD-10-CM | POA: Diagnosis not present

## 2020-07-23 DIAGNOSIS — T83518A Infection and inflammatory reaction due to other urinary catheter, initial encounter: Secondary | ICD-10-CM | POA: Diagnosis present

## 2020-07-23 DIAGNOSIS — G929 Unspecified toxic encephalopathy: Secondary | ICD-10-CM | POA: Diagnosis not present

## 2020-07-23 DIAGNOSIS — I441 Atrioventricular block, second degree: Secondary | ICD-10-CM | POA: Diagnosis present

## 2020-07-23 DIAGNOSIS — Z79899 Other long term (current) drug therapy: Secondary | ICD-10-CM | POA: Diagnosis not present

## 2020-07-23 DIAGNOSIS — Z950 Presence of cardiac pacemaker: Secondary | ICD-10-CM | POA: Diagnosis not present

## 2020-07-23 DIAGNOSIS — N390 Urinary tract infection, site not specified: Secondary | ICD-10-CM | POA: Diagnosis present

## 2020-07-23 DIAGNOSIS — D509 Iron deficiency anemia, unspecified: Secondary | ICD-10-CM | POA: Diagnosis present

## 2020-07-23 DIAGNOSIS — Z20822 Contact with and (suspected) exposure to covid-19: Secondary | ICD-10-CM | POA: Diagnosis present

## 2020-07-23 DIAGNOSIS — K219 Gastro-esophageal reflux disease without esophagitis: Secondary | ICD-10-CM | POA: Diagnosis present

## 2020-07-23 DIAGNOSIS — T83511D Infection and inflammatory reaction due to indwelling urethral catheter, subsequent encounter: Secondary | ICD-10-CM | POA: Diagnosis not present

## 2020-07-23 DIAGNOSIS — Z6820 Body mass index (BMI) 20.0-20.9, adult: Secondary | ICD-10-CM | POA: Diagnosis not present

## 2020-07-23 DIAGNOSIS — D62 Acute posthemorrhagic anemia: Secondary | ICD-10-CM | POA: Diagnosis present

## 2020-07-23 DIAGNOSIS — Y846 Urinary catheterization as the cause of abnormal reaction of the patient, or of later complication, without mention of misadventure at the time of the procedure: Secondary | ICD-10-CM | POA: Diagnosis present

## 2020-07-23 DIAGNOSIS — R54 Age-related physical debility: Secondary | ICD-10-CM | POA: Diagnosis present

## 2020-07-23 DIAGNOSIS — J449 Chronic obstructive pulmonary disease, unspecified: Secondary | ICD-10-CM | POA: Diagnosis present

## 2020-07-23 LAB — COMPREHENSIVE METABOLIC PANEL
ALT: 13 U/L (ref 0–44)
AST: 11 U/L — ABNORMAL LOW (ref 15–41)
Albumin: 2.6 g/dL — ABNORMAL LOW (ref 3.5–5.0)
Alkaline Phosphatase: 52 U/L (ref 38–126)
Anion gap: 4 — ABNORMAL LOW (ref 5–15)
BUN: 17 mg/dL (ref 8–23)
CO2: 20 mmol/L — ABNORMAL LOW (ref 22–32)
Calcium: 8.3 mg/dL — ABNORMAL LOW (ref 8.9–10.3)
Chloride: 114 mmol/L — ABNORMAL HIGH (ref 98–111)
Creatinine, Ser: 0.89 mg/dL (ref 0.61–1.24)
GFR, Estimated: >60 mL/min (ref >60)
Glucose, Bld: 118 mg/dL — ABNORMAL HIGH (ref 70–99)
Potassium: 3.8 mmol/L (ref 3.5–5.1)
Sodium: 138 mmol/L (ref 135–145)
Total Bilirubin: 0.6 mg/dL (ref 0.3–1.2)
Total Protein: 5.5 g/dL — ABNORMAL LOW (ref 6.5–8.1)

## 2020-07-23 LAB — CBC
HCT: 25.4 % — ABNORMAL LOW (ref 39.0–52.0)
Hemoglobin: 8.6 g/dL — ABNORMAL LOW (ref 13.0–17.0)
MCH: 31.4 pg (ref 26.0–34.0)
MCHC: 33.9 g/dL (ref 30.0–36.0)
MCV: 92.7 fL (ref 80.0–100.0)
Platelets: 188 10*3/uL (ref 150–400)
RBC: 2.74 MIL/uL — ABNORMAL LOW (ref 4.22–5.81)
RDW: 13.7 % (ref 11.5–15.5)
WBC: 7.8 10*3/uL (ref 4.0–10.5)
nRBC: 0 % (ref 0.0–0.2)

## 2020-07-23 LAB — FERRITIN: Ferritin: 219 ng/mL (ref 24–336)

## 2020-07-23 LAB — URINALYSIS, COMPLETE (UACMP) WITH MICROSCOPIC
Bacteria, UA: NONE SEEN
RBC / HPF: >50 RBC/hpf — ABNORMAL HIGH (ref 0–5)
Specific Gravity, Urine: 1.02 (ref 1.005–1.030)
Squamous Epithelial / HPF: NONE SEEN (ref 0–5)

## 2020-07-23 LAB — IRON AND TIBC
Iron: 39 ug/dL — ABNORMAL LOW (ref 45–182)
Saturation Ratios: 15 % — ABNORMAL LOW (ref 17.9–39.5)
TIBC: 255 ug/dL (ref 250–450)
UIBC: 216 ug/dL

## 2020-07-23 LAB — HEMOGLOBIN: Hemoglobin: 8.7 g/dL — ABNORMAL LOW (ref 13.0–17.0)

## 2020-07-23 LAB — LACTIC ACID, PLASMA: Lactic Acid, Venous: 1.5 mmol/L (ref 0.5–1.9)

## 2020-07-23 MED ORDER — FLUCONAZOLE 100 MG PO TABS
100.0000 mg | ORAL_TABLET | Freq: Every day | ORAL | Status: DC
  Administered 2020-07-23 – 2020-07-26 (×4): 100 mg via ORAL
  Filled 2020-07-23 (×5): qty 1

## 2020-07-23 MED ORDER — PIPERACILLIN-TAZOBACTAM 3.375 G IVPB
3.3750 g | Freq: Three times a day (TID) | INTRAVENOUS | Status: DC
  Administered 2020-07-23 – 2020-07-26 (×10): 3.375 g via INTRAVENOUS
  Filled 2020-07-23 (×14): qty 50

## 2020-07-23 MED ORDER — MORPHINE SULFATE (PF) 2 MG/ML IV SOLN
2.0000 mg | INTRAVENOUS | Status: DC | PRN
  Administered 2020-07-24: 2 mg via INTRAVENOUS
  Filled 2020-07-23: qty 1

## 2020-07-23 MED ORDER — CHLORHEXIDINE GLUCONATE CLOTH 2 % EX PADS
6.0000 | MEDICATED_PAD | Freq: Every day | CUTANEOUS | Status: DC
  Administered 2020-07-23 – 2020-07-25 (×3): 6 via TOPICAL

## 2020-07-23 MED ORDER — OXYCODONE HCL 5 MG PO TABS
5.0000 mg | ORAL_TABLET | ORAL | Status: DC | PRN
  Administered 2020-07-24 (×2): 5 mg via ORAL
  Filled 2020-07-23 (×2): qty 1

## 2020-07-23 MED ORDER — OXYBUTYNIN CHLORIDE 5 MG PO TABS
5.0000 mg | ORAL_TABLET | Freq: Three times a day (TID) | ORAL | Status: DC
  Administered 2020-07-23 – 2020-07-24 (×4): 5 mg via ORAL
  Filled 2020-07-23 (×6): qty 1

## 2020-07-23 NOTE — ED Notes (Signed)
Informed RN bed assigned 1324 

## 2020-07-23 NOTE — ED Notes (Signed)
Ambulated to toilet to have bowel movment.  C\hux and diaper on bed have dark urine on them.  Says it is leaking around foley catheter.

## 2020-07-23 NOTE — Progress Notes (Signed)
CODE SEPSIS - PHARMACY COMMUNICATION  **Broad Spectrum Antibiotics should be administered within 1 hour of Sepsis diagnosis**  Time Code Sepsis Called/Page Received: 2/28 @ 2215   Antibiotics Ordered: Zosyn   Time of 1st antibiotic administration: 2/28 @ 2320   Additional action taken by pharmacy: reminded RN to given abx within 1 hr of code sepsis,  RN stated they were waiting for urine collection   If necessary, Name of Provider/Nurse Contacted:     Robbins,Jason D ,PharmD Clinical Pharmacist  07/23/2020  1:19 AM

## 2020-07-23 NOTE — ED Notes (Signed)
Changed urine bag

## 2020-07-23 NOTE — ED Notes (Signed)
Family and registration at bedside

## 2020-07-23 NOTE — ED Notes (Signed)
MD notified of patient's morning H&H. Plan for recheck at 1pm and transfuse if <8.0.  Patient off unit to CT at present.

## 2020-07-23 NOTE — Sepsis Progress Note (Signed)
Spoke with RN via secure chat, verified that blood cultures were drawn & sent, despite not showing as collected or processing in chart review or results tab.

## 2020-07-23 NOTE — Consult Note (Signed)
Urology Consult  I have been asked to see the patient by Dr. Priscella Mann, for evaluation and management of gross hematuria and lower abdominal pain.  Chief Complaint: Gross hematuria, lower abdominal pain  History of Present Illness: James Dickerson is a 80 y.o. year old male with PMH recurrent prostate cancer s/p EBRT with unknown grade/stage disease with recent pelvic adenopathy on CT and elevated PSA as well as urethral stricture s/p SPT, urethral dilation, and Foley placement with Dr. Tresa Moore who presented to the ED overnight with reports of gross hematuria and lower abdominal pain.  He has remained afebrile during this admission and was mildly hypotensive on presentation, however he is normotensive this morning. He is being admitted for management of sepsis of suspected urinary source.  Admission labs notable for WBC count 7.5; hemoglobin 10.3 (10.2-12.9 over the past month); creatinine 1.08 (baseline 0.9-1.0); lactate 4.0; and urinalysis with red, turbid color, >50 RBCs/hpf, 21-50 WBCs/hpf, and budding yeast.  Urine and blood cultures pending, on antibiotics as below.  Notably, hemoglobin is down today, 8.6.  Patient underwent emergent endovascular repair for expanding AAA at [facility] on 06/23/2020. Urology was consulted intraoperatively for management of difficult Foley placement. He was seen by Dr. Tresa Moore, who noted multifocal urethral stricture disease from the pendulous urethra to the prostate. He performed a urethral dilation with successful placement of an 19 French Foley catheter and 18 French suprapubic catheter. Patient was recommended to keep SP tube capped and Foley to gravity drainage during that admission with plans for outpatient voiding trial in 3 to 4 weeks.  In the interim, patient has presented to the emergency department twice with reports of pain and gross hematuria associated with his Foley and suprapubic catheters. On 07/18/2020, UA was notable for nitrites, microscopic hematuria,  and pyuria; he was treated with Rocephin and discharged on Bactrim DS twice daily x10 days; culture ultimately resulted with pansensitive Pseudomonas aeruginosa. On repeat visit dated 07/21/2020, UA was notable for microscopic hematuria with improved but persistent pyuria, and resolved nitrites. No urine culture sent that day but he was switched to culture appropriate Cipro 500 mg twice daily x7 days.  CT stone study today revealed a punctate nonobstructing right renal stone, no hydronephrosis, and appropriately positioned suprapubic and Foley catheters. There does not appear to be clot material in his bladder.  Today he reports never having undergone voiding trial as recommended. He states he has a urologist that he thinks is in [place], but he is not sure of his doctor's name or when he was last seen. I was able to verify by phone that he has never been seen by Dr. Yves Dill and is scheduled for an appointment at [facility] this Thursday, 07/25/2020.  On physical exam, patient has a plugged 18Fr suprapubic catheter and a draining 18Fr urethral catheter in place. His urine is dark red without clots. These appear to be likely Council-tip catheters consistent with placement intraoperatively with Dr. Tresa Moore on 06/23/2020. Patient asks what the catheters are for and reports severe occasional sensations of urinary urgency. He states he passed blood clots in his urine several days ago, but this has resolved.  Anti-infectives (From admission, onward)   Start     Dose/Rate Route Frequency Ordered Stop   07/23/20 0500  piperacillin-tazobactam (ZOSYN) IVPB 3.375 g        3.375 g 12.5 mL/hr over 240 Minutes Intravenous Every 8 hours 07/23/20 0147     07/22/20 2230  piperacillin-tazobactam (ZOSYN) IVPB 3.375 g  3.375 g 100 mL/hr over 30 Minutes Intravenous  Once 07/22/20 2215 07/22/20 2346      Past Medical History:  Diagnosis Date  . Back pain   . Cardiomegaly   . COPD (chronic obstructive  pulmonary disease) ([place])   . Cystic kidney disease   . Depressive disorder   . GERD (gastroesophageal reflux disease)   . High cholesterol   . History of prostate cancer   . Hypertension   . Prostate cancer ([place]) 2018   Rad seeds  . Thyroid nodule     Past Surgical History:  Procedure Laterality Date  . ABDOMINAL AORTIC ENDOVASCULAR STENT GRAFT N/A 06/22/2020   Procedure: ABDOMINAL AORTIC ENDOVASCULAR STENT GRAFT;  Surgeon: Cherre Robins, MD;  Location: [place];  Service: Vascular;  Laterality: N/A;  . APPENDECTOMY  1971  . CYSTOSCOPY WITH URETHRAL DILATATION N/A 06/22/2020   Procedure: CYSTOSCOPY WITH URETHRAL DILATATION;  Surgeon: Alexis Frock, MD;  Location: [place];  Service: Urology;  Laterality: N/A;  . INSERTION OF ILIAC STENT Right 06/22/2020   Procedure: INSERTION OF RIGHT EXTERNAL ILIAC ARTERY STENT;  Surgeon: Cherre Robins, MD;  Location: [place];  Service: Vascular;  Laterality: Right;  . INSERTION OF SUPRAPUBIC CATHETER N/A 06/22/2020   Procedure: INSERTION OF SUPRAPUBIC CATHETER;  Surgeon: Alexis Frock, MD;  Location: [place];  Service: Urology;  Laterality: N/A;  . PACEMAKER INSERTION N/A 08/06/2016   Procedure: INSERTION PACEMAKER;  Surgeon: Isaias Cowman, MD;  Location: [facility];  Service: Cardiovascular;  Laterality: N/A;    Home Medications:  Current Meds  Medication Sig  . albuterol (PROVENTIL) (2.5 MG/3ML) 0.083% nebulizer solution Take 2.5 mg by nebulization every 6 (six) hours as needed.  Marland Kitchen amLODipine (NORVASC) 5 MG tablet Take 5 mg by mouth every morning.  Marland Kitchen aspirin EC 81 MG EC tablet Take 1 tablet (81 mg total) by mouth daily at 6 (six) AM. Swallow whole.  Marland Kitchen atorvastatin (LIPITOR) 40 MG tablet Take 1 tablet (40 mg total) by mouth at bedtime.  . budesonide-formoterol (SYMBICORT) 160-4.5 MCG/ACT inhaler Inhale 2 puffs into the lungs 2 (two) times daily.  . Cholecalciferol (VITAMIN D3) 50 MCG (2000 UT) CHEW Chew 2,000 Units by mouth daily.  .  ciprofloxacin (CIPRO) 500 MG tablet Take 1 tablet (500 mg total) by mouth 2 (two) times daily for 5 days.  Marland Kitchen donepezil (ARICEPT) 5 MG tablet Take 5 mg by mouth at bedtime.  Marland Kitchen ipratropium-albuterol (DUONEB) 0.5-2.5 (3) MG/3ML SOLN Inhale 3 mLs into the lungs every 6 (six) hours as needed (Wheezing).  . megestrol (MEGACE) 20 MG tablet Take 20 mg by mouth 2 (two) times daily.  . pantoprazole (PROTONIX) 40 MG tablet Take 40 mg by mouth 2 (two) times daily.  Marland Kitchen sulfamethoxazole-trimethoprim (BACTRIM DS) 800-160 MG tablet Take 1 tablet by mouth 2 (two) times daily.  . tamsulosin (FLOMAX) 0.4 MG CAPS capsule Take 0.4 mg by mouth daily.  . valsartan (DIOVAN) 160 MG tablet Take 160 mg by mouth daily.    Allergies: No Known Allergies  No family history on file.  Social History:  reports that he has been smoking cigarettes. He has a 16.75 pack-year smoking history. He has never used smokeless tobacco. He reports that he does not drink alcohol and does not use drugs.  ROS: A complete review of systems was performed.  All systems are negative except for pertinent findings as noted.  Physical Exam:  Vital signs in last 24 hours: Temp:  [98.1 F (36.7 C)-98.4 F (  36.9 C)] 98.1 F (36.7 C) (03/01 0900) Pulse Rate:  [68-99] 77 (03/01 0900) Resp:  [14-24] 18 (03/01 0900) BP: (94-123)/(52-76) 120/69 (03/01 0900) SpO2:  [94 %-100 %] 96 % (03/01 0900) Weight:  [49.4 kg] 49.4 kg (02/28 1956) Constitutional:  Alert, thin, no acute distress HEENT: [place] AT, moist mucus membranes.  Hoarse voice. Cardiovascular: No clubbing, cyanosis, or edema. Respiratory: Normal respiratory effort Skin: No rashes, bruises or suspicious lesions Neurologic: Grossly intact, no focal deficits, moving all 4 extremities Psychiatric: Normal mood and affect  Laboratory Data:  Recent Labs    07/22/20 2017 07/23/20 0430  WBC 7.5 7.8  HGB 10.3* 8.6*  HCT 32.3* 25.4*   Recent Labs    07/22/20 2017 07/23/20 0430  NA 139  138  K 3.8 3.8  CL 109 114*  CO2 18* 20*  GLUCOSE 184* 118*  BUN 19 17  CREATININE 1.08 0.89  CALCIUM 9.0 8.3*   Recent Labs    07/22/20 2017  INR 1.2   Urinalysis    Component Value Date/Time   COLORURINE RED (A) 07/22/2020 2315   APPEARANCEUR TURBID (A) 07/22/2020 2315   APPEARANCEUR Clear 08/09/2011 1744   LABSPEC 1.020 07/22/2020 2315   LABSPEC 1.015 08/09/2011 1744   PHURINE  07/22/2020 2315    TEST NOT REPORTED DUE TO COLOR INTERFERENCE OF URINE PIGMENT   GLUCOSEU (A) 07/22/2020 2315    TEST NOT REPORTED DUE TO COLOR INTERFERENCE OF URINE PIGMENT   GLUCOSEU Negative 08/09/2011 1744   HGBUR (A) 07/22/2020 2315    TEST NOT REPORTED DUE TO COLOR INTERFERENCE OF URINE PIGMENT   BILIRUBINUR (A) 07/22/2020 2315    TEST NOT REPORTED DUE TO COLOR INTERFERENCE OF URINE PIGMENT   BILIRUBINUR Negative 08/09/2011 1744   KETONESUR (A) 07/22/2020 2315    TEST NOT REPORTED DUE TO COLOR INTERFERENCE OF URINE PIGMENT   PROTEINUR (A) 07/22/2020 2315    TEST NOT REPORTED DUE TO COLOR INTERFERENCE OF URINE PIGMENT   NITRITE (A) 07/22/2020 2315    TEST NOT REPORTED DUE TO COLOR INTERFERENCE OF URINE PIGMENT   LEUKOCYTESUR (A) 07/22/2020 2315    TEST NOT REPORTED DUE TO COLOR INTERFERENCE OF URINE PIGMENT   LEUKOCYTESUR 1+ 08/09/2011 1744   Results for orders placed or performed during the hospital encounter of 07/22/20  Resp Panel by RT-PCR (Flu A&B, Covid) Nasopharyngeal Swab     Status: None   Collection Time: 07/22/20  8:17 PM   Specimen: Nasopharyngeal Swab; Nasopharyngeal(NP) swabs in vial transport medium  Result Value Ref Range Status   SARS Coronavirus 2 by RT PCR NEGATIVE NEGATIVE Final    Comment: (NOTE) SARS-CoV-2 target nucleic acids are NOT DETECTED.  The SARS-CoV-2 RNA is generally detectable in upper respiratory specimens during the acute phase of infection. The lowest concentration of SARS-CoV-2 viral copies this assay can detect is 138 copies/mL. A negative  result does not preclude SARS-Cov-2 infection and should not be used as the sole basis for treatment or other patient management decisions. A negative result may occur with  improper specimen collection/handling, submission of specimen other than nasopharyngeal swab, presence of viral mutation(s) within the areas targeted by this assay, and inadequate number of viral copies(<138 copies/mL). A negative result must be combined with clinical observations, patient history, and epidemiological information. The expected result is Negative.  Fact Sheet for Patients:  EntrepreneurPulse.com.au  Fact Sheet for Healthcare Providers:  IncredibleEmployment.be  This test is no t yet approved or cleared by the [place]  FDA and  has been authorized for detection and/or diagnosis of SARS-CoV-2 by FDA under an Emergency Use Authorization (EUA). This EUA will remain  in effect (meaning this test can be used) for the duration of the COVID-19 declaration under Section 564(b)(1) of the Act, 21 U.S.C.section 360bbb-3(b)(1), unless the authorization is terminated  or revoked sooner.       Influenza A by PCR NEGATIVE NEGATIVE Final   Influenza B by PCR NEGATIVE NEGATIVE Final    Comment: (NOTE) The Xpert Xpress SARS-CoV-2/FLU/RSV plus assay is intended as an aid in the diagnosis of influenza from Nasopharyngeal swab specimens and should not be used as a sole basis for treatment. Nasal washings and aspirates are unacceptable for Xpert Xpress SARS-CoV-2/FLU/RSV testing.  Fact Sheet for Patients: EntrepreneurPulse.com.au  Fact Sheet for Healthcare Providers: IncredibleEmployment.be  This test is not yet approved or cleared by the [place] FDA and has been authorized for detection and/or diagnosis of SARS-CoV-2 by FDA under an Emergency Use Authorization (EUA). This EUA will remain in effect (meaning this test can be used)  for the duration of the COVID-19 declaration under Section 564(b)(1) of the Act, 21 U.S.C. section 360bbb-3(b)(1), unless the authorization is terminated or revoked.  Performed at [facility], [address] [place], [place] [address]   Blood culture (routine x 2)     Status: None (Preliminary result)   Collection Time: 07/22/20  8:22 PM   Specimen: BLOOD  Result Value Ref Range Status   Specimen Description BLOOD RIGHT [facility]  Final   Special Requests   Final    BOTTLES DRAWN AEROBIC AND ANAEROBIC Blood Culture adequate volume   Culture   Final    NO GROWTH <12 HOURS Performed at [facility], [address], [place], [place] [address]    Report Status PENDING  Incomplete  Blood culture (routine x 2)     Status: None (Preliminary result)   Collection Time: 07/22/20  8:22 PM   Specimen: BLOOD  Result Value Ref Range Status   Specimen Description BLOOD LEFT AC  Final   Special Requests   Final    BOTTLES DRAWN AEROBIC AND ANAEROBIC Blood Culture adequate volume   Culture   Final    NO GROWTH <12 HOURS Performed at [facility], [address], [place], [place] [address]    Report Status PENDING  Incomplete    Radiologic Imaging: CT RENAL STONE STUDY  Result Date: 07/23/2020 CLINICAL DATA:  Hematuria EXAM: CT ABDOMEN AND PELVIS WITHOUT CONTRAST TECHNIQUE: Multidetector CT imaging of the abdomen and pelvis was performed following the standard protocol without IV contrast. COMPARISON:  06/22/2020 FINDINGS: Lower chest: Bibasilar scarring. 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 No acute abnormality. Hepatobiliary: No focal hepatic abnormality. Gallbladder unremarkable. Pancreas: No focal abnormality or ductal dilatation. Spleen: No focal abnormality.  Normal size. Adrenals/Urinary Tract: Left lower pole cyst measures 5 cm, stable. Punctate nonobstructing stone in the midpole of the right kidney. No ureteral stones or  hydronephrosis. Adrenal glands unremarkable. Suprapubic catheter and Foley catheter within the urinary bladder which is grossly unremarkable. Stomach/Bowel: Large stool burden throughout the colon. No bowel obstruction. Grossly unremarkable. 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 Aneurysm sac size measures up to 5.4 cm compared to 5.7 cm previously. No adenopathy Reproductive: No gross abnormality. Other: No free fluid or free air. Musculoskeletal: No acute bony abnormality. IMPRESSION: Punctate right midpole renal stone.  No hydronephrosis. Suprapubic and  Foley catheters within the urinary bladder, grossly unremarkable. No ureteral stones or hydronephrosis. Prior stent graft repair of AAA. Aneurysm sac size measures up to 5.4 cm compared to 5.7 cm previously. Large stool burden. Bibasilar scarring. Electronically Signed   By: Rolm Baptise M.D.   On: 07/23/2020 08:37   Assessment & Plan:  80 year old male with recurrent prostate cancer with a remote history of EBRT and multifocal urethral stricture disease s/p recent urethral dilation with placement of urethral and suprapubic catheters admitted with concerns for urosepsis and gross hematuria.  Differential for gross hematuria at this time includes hemorrhagic cystitis versus catheter irritation in the setting of anticoagulant use versus radiation cystitis.  Agree with culture appropriate antibiotics and recommend starting empiric fluconazole given findings of budding yeast on admission UA.  Recommend patient keep scheduled outpatient follow-up at [place] this week.  Okay to reschedule if he remains admitted at that time.  Recommend continued blood monitoring with transfusion as needed.  If his gross hematuria does not improve on anti-infectives, okay to start CBI.  I do not recommend exchanging his catheters given his postoperative status and outpatient follow-up and instead recommend connecting CBI to  his SP tube and allowing it to drain via his urethral Foley.  Additionally, patient reports significant bladder spasms associated with his catheters.  Agree with starting oxybutynin 5 mg 3 times daily for management of these.  Notably, patient is on Aricept.  Recommend switching to trospium if his cognitive status worsens on oxybutynin.  Recommendations: -Agree with culture-appropriate antibiotics, may also start fluconazole with budding yeast on UA pending urine culture -Agree with oxybutynin 5mg  TID for management of bladder spasms, may switch to trospium 20mg  1-2 times daily with increased confusion on oxybutynin -Continue blood monitoring and transfuse as needed; may start CBI connected to SPT and draining via Foley if gross hematuria does not improve on anti-infectives as above -Outpatient follow-up with [facility] for voiding trial, catheter management, and recurrent prostate cancer treatment  Thank you for involving me in this patient's care, I will continue to follow along.  Debroah Loop, PA-C 07/23/2020 11:12 AM

## 2020-07-23 NOTE — Plan of Care (Signed)

## 2020-07-23 NOTE — Progress Notes (Signed)
Pharmacy Antibiotic Note  Bryam Taborda is a 80 y.o. male admitted on 07/22/2020 with UTI, pseudomonas.  Pharmacy has been consulted for Zosyn dosing.  Plan: Zosyn 3.375g IV q8h (4 hour infusion).  Height: 5\' 3"  (160 cm) Weight: 49.4 kg (109 lb) IBW/kg (Calculated) : 56.9  Temp (24hrs), Avg:98.4 F (36.9 C), Min:98.4 F (36.9 C), Max:98.4 F (36.9 C)  Recent Labs  Lab 07/18/20 1213 07/22/20 2017 07/22/20 2130 07/23/20 0059  WBC 7.0 7.5  --   --   CREATININE 1.09 1.08  --   --   LATICACIDVEN  --  4.0* 4.1* 1.5    Estimated Creatinine Clearance: 38.1 mL/min (by C-G formula based on SCr of 1.08 mg/dL).    No Known Allergies  Antimicrobials this admission:   >>    >>   Dose adjustments this admission:   Microbiology results:  BCx:   UCx:    Sputum:    MRSA PCR:   Thank you for allowing pharmacy to be a part of this patient's care.  Robbins,Jason D 07/23/2020 1:47 AM

## 2020-07-23 NOTE — Progress Notes (Signed)
PROGRESS NOTE    James Dickerson  TMH:[id number] DOB: [birth year] DOA: 07/22/2020 PCP: Lorelee Market, MD  Brief Narrative: 80 y.o. male with medical history significant of thoracic aortic aneurysm, abdominal aortic aneurysm status post stenting, bradycardia, COPD, hyperlipidemia, secondary heart block, depression, GERD, prostate cancer who presents with ongoing UTI symptoms including dysuria and hematuria.  Also with issues with Foley catheter relating to this.             Brief Background, he had an aortic aneurysm repair in late January and during the procedure he had a traumatic Foley placement resulting in the need for a suprapubic catheter to be placed.  Since that time he stopped using his suprapubic catheter and discontinued using a Foley catheter.  He was originally evaluated by Dr. Tresa Moore with urology.             Recently, he has been seen for UTI symptoms.  Per reports he had his Foley last replaced on 2/20 and then he came in to be seen on 2/24 and began treatment for a UTI.  He came in again on 2/27 due to worsening hematuria and at that time his antibiotics were changed his Cipro as his cultures from 2/24 grew out Pseudomonas.  He comes in today with worsening pain and hematuria with the pain being constant and nothing providing him relief.  He has apparently failed outpatient antibiotics.  Given history of pseudomonal UTIs he was started on Zosyn.  He received narcotics and oxybutynin for some pain control.  He did endorse some relief with time my evaluation he was still endorsing some suprapubic pain.  Foley catheter noted to have grossly bloody urine without clots.  Have called consultation with urology, recommendations appreciated.  Recommend continue antibiotics and oxybutynin for pain control.  CT renal stone study did not demonstrate obstruction or hydronephrosis.  Patient may require CBI initial anti-infective therapy is not effective.    Assessment & Plan:   Principal  Problem:   UTI (urinary tract infection) Active Problems:   COPD (chronic obstructive pulmonary disease) (HCC)   Essential hypertension   HLD (hyperlipidemia)   Foley catheter in place   GERD (gastroesophageal reflux disease)   Elevated lactic acid level   Gross hematuria   Hematuria   Catheter associated UTI, POA Gross hematuria Of note, history of prostate cancer 6 years prior History of bladder instrumentation Multiple visits for worsening UTI symptoms Multiple exposures to antibiotics including Bactrim and Cipro Symptoms worsen on Cipro patient was admitted for IV antibiotics Lactic acid greater than 4 prior to fluid administration however no other sirs criteria Does not meet criteria for sepsis at this time Consulted urology with recs to continue anti-infectives and oxybutynin Plan: Continue IV fluids Continue per facility is back to him Monitor fever curve and cultures Daily CBC BMP Continue home Flomax Added oxybutynin 5 3 times daily Monitor mental status while on oxybutynin Multimodal pain control Urology following.  May need to consider CBI if gross hematuria does not resolve with anti-infective therapy  Acute on chronic anemia Likely blood loss in the setting of hematuria Possibly also multifactorial Iron, ferritin, TIBC ordered and pending  COPD No evidence of exacerbation Patient on room air Continue Dulera per pharmacy switch As needed DuoNebs  Hyperlipidemia Continue home statin  Hypertension Continue amlodipine Hold home valsartan  Second degree heart block No acute issues Status post pacemaker  GERD PPI    DVT prophylaxis: SCDs Code Status: Full Family Communication: None today.  Offered  to call but patient declined.  Lives in group home Disposition Plan: Status is: Inpatient  Remains inpatient appropriate because:Inpatient level of care appropriate due to severity of illness   Dispo: The patient is from: Group home               Anticipated d/c is to: Group home              Patient currently is not medically stable to d/c.   Difficult to place patient No  Patient likely to return to group home once acute issues including hematuria and catheter associated UTI resolved     Level of care: Med-Surg  Consultants:   Urology  Procedures:  None  Antimicrobials:   Zosyn   Subjective: Patient seen and examined.  Endorsing some suprapubic pain though improved from prior  Objective: Vitals:   07/23/20 1100 07/23/20 1130 07/23/20 1200 07/23/20 1450  BP:    103/82  Pulse:   (!) 47 70  Resp: (!) 30 19 17 15   Temp:    98.1 F (36.7 C)  TempSrc:    Oral  SpO2:   100% 100%  Weight:      Height:        Intake/Output Summary (Last 24 hours) at 07/23/2020 1510 Last data filed at 07/23/2020 0920 Gross per 24 hour  Intake 858.91 ml  Output 300 ml  Net 558.91 ml   Filed Weights   07/22/20 1956  Weight: 49.4 kg    Examination:  General exam: No acute distress.  Appears frail and chronically ill Respiratory system: Poor respiratory effort.  Lungs clear.  Normal work of breathing.  Room air Cardiovascular system: S1-S2, regular rate and rhythm, no murmurs. Gastrointestinal system: Thin, nontender, nondistended, status post suprapubic catheter, capped GU: Foley catheter in place, blood without clots noted in Foley bag Central nervous system: Alert and oriented. No focal neurological deficits. Extremities: Decreased power symmetrically Skin: Scattered excoriations, no other lesions or ulcers Psychiatry: Judgement and insight appear normal. Mood & affect appropriate.     Data Reviewed: I have personally reviewed following labs and imaging studies  CBC: Recent Labs  Lab 07/18/20 1213 07/22/20 2017 07/23/20 0430 07/23/20 1500  WBC 7.0 7.5 7.8  --   NEUTROABS  --  6.6  --   --   HGB 11.5* 10.3* 8.6* 8.7*  HCT 34.2* 32.3* 25.4*  --   MCV 92.9 94.4 92.7  --   PLT 257 246 188  --    Basic  Metabolic Panel: Recent Labs  Lab 07/18/20 1213 07/22/20 2017 07/23/20 0430  NA 137 139 138  K 4.0 3.8 3.8  CL 105 109 114*  CO2 24 18* 20*  GLUCOSE 103* 184* 118*  BUN 20 19 17   CREATININE 1.09 1.08 0.89  CALCIUM 9.3 9.0 8.3*   GFR: Estimated Creatinine Clearance: 46.3 mL/min (by C-G formula based on SCr of 0.89 mg/dL). Liver Function Tests: Recent Labs  Lab 07/22/20 2017 07/23/20 0430  AST 18 11*  ALT 15 13  ALKPHOS 68 52  BILITOT 0.8 0.6  PROT 7.1 5.5*  ALBUMIN 3.2* 2.6*   No results for input(s): LIPASE, AMYLASE in the last 168 hours. No results for input(s): AMMONIA in the last 168 hours. Coagulation Profile: Recent Labs  Lab 07/22/20 2017  INR 1.2   Cardiac Enzymes: No results for input(s): CKTOTAL, CKMB, CKMBINDEX, TROPONINI in the last 168 hours. BNP (last 3 results) No results for input(s): PROBNP in the last 8760 hours.  HbA1C: No results for input(s): HGBA1C in the last 72 hours. CBG: No results for input(s): GLUCAP in the last 168 hours. Lipid Profile: No results for input(s): CHOL, HDL, LDLCALC, TRIG, CHOLHDL, LDLDIRECT in the last 72 hours. Thyroid Function Tests: No results for input(s): TSH, T4TOTAL, FREET4, T3FREE, THYROIDAB in the last 72 hours. Anemia Panel: Recent Labs    07/22/20 2130  FERRITIN 219  TIBC 255  IRON 39*   Sepsis Labs: Recent Labs  Lab 07/22/20 2017 07/22/20 2130 07/23/20 0059  LATICACIDVEN 4.0* 4.1* 1.5    Recent Results (from the past 240 hour(s))  Urine culture     Status: Abnormal   Collection Time: 07/18/20  3:21 PM   Specimen: Urine, Catheterized  Result Value Ref Range Status   Specimen Description   Final    URINE, CATHETERIZED Performed at [facility], [address] [place], [place] [address]    Special Requests   Final    NONE Performed at [facility], [address] [place],  [address]    Culture >=100,000 COLONIES/mL PSEUDOMONAS AERUGINOSA (A)  Final   Report  Status 07/21/2020 FINAL  Final   Organism ID, Bacteria PSEUDOMONAS AERUGINOSA (A)  Final      Susceptibility   Pseudomonas aeruginosa - MIC*    CEFTAZIDIME <=1 SENSITIVE Sensitive     CIPROFLOXACIN <=0.25 SENSITIVE Sensitive     GENTAMICIN <=1 SENSITIVE Sensitive     IMIPENEM 1 SENSITIVE Sensitive     PIP/TAZO <=4 SENSITIVE Sensitive     CEFEPIME 2 SENSITIVE Sensitive     * >=100,000 COLONIES/mL PSEUDOMONAS AERUGINOSA  Resp Panel by RT-PCR (Flu A&B, Covid) Nasopharyngeal Swab     Status: None   Collection Time: 07/22/20  8:17 PM   Specimen: Nasopharyngeal Swab; Nasopharyngeal(NP) swabs in vial transport medium  Result Value Ref Range Status   SARS Coronavirus 2 by RT PCR NEGATIVE NEGATIVE Final    Comment: (NOTE) SARS-CoV-2 target nucleic acids are NOT DETECTED.  The SARS-CoV-2 RNA is generally detectable in upper respiratory specimens during the acute phase of infection. The lowest concentration of SARS-CoV-2 viral copies this assay can detect is 138 copies/mL. A negative result does not preclude SARS-Cov-2 infection and should not be used as the sole basis for treatment or other patient management decisions. A negative result may occur with  improper specimen collection/handling, submission of specimen other than nasopharyngeal swab, presence of viral mutation(s) within the areas targeted by this assay, and inadequate number of viral copies(<138 copies/mL). A negative result must be combined with clinical observations, patient history, and epidemiological information. The expected result is Negative.  Fact Sheet for Patients:  EntrepreneurPulse.com.au  Fact Sheet for Healthcare Providers:  IncredibleEmployment.be  This test is no t yet approved or cleared by the [place] FDA and  has been authorized for detection and/or diagnosis of SARS-CoV-2 by FDA under an Emergency Use Authorization (EUA). This EUA will remain  in effect (meaning  this test can be used) for the duration of the COVID-19 declaration under Section 564(b)(1) of the Act, 21 U.S.C.section 360bbb-3(b)(1), unless the authorization is terminated  or revoked sooner.       Influenza A by PCR NEGATIVE NEGATIVE Final   Influenza B by PCR NEGATIVE NEGATIVE Final    Comment: (NOTE) The Xpert Xpress SARS-CoV-2/FLU/RSV plus assay is intended as an aid in the diagnosis of influenza from Nasopharyngeal swab specimens and should not be used as a sole basis for treatment. Nasal washings and aspirates  are unacceptable for Xpert Xpress SARS-CoV-2/FLU/RSV testing.  Fact Sheet for Patients: EntrepreneurPulse.com.au  Fact Sheet for Healthcare Providers: IncredibleEmployment.be  This test is not yet approved or cleared by the [place] FDA and has been authorized for detection and/or diagnosis of SARS-CoV-2 by FDA under an Emergency Use Authorization (EUA). This EUA will remain in effect (meaning this test can be used) for the duration of the COVID-19 declaration under Section 564(b)(1) of the Act, 21 U.S.C. section 360bbb-3(b)(1), unless the authorization is terminated or revoked.  Performed at [facility], [address] [place], [place] [address]   Blood culture (routine x 2)     Status: None (Preliminary result)   Collection Time: 07/22/20  8:22 PM   Specimen: BLOOD  Result Value Ref Range Status   Specimen Description BLOOD RIGHT [facility]  Final   Special Requests   Final    BOTTLES DRAWN AEROBIC AND ANAEROBIC Blood Culture adequate volume   Culture   Final    NO GROWTH <12 HOURS Performed at [facility], [address], [place], [place] [address]    Report Status PENDING  Incomplete  Blood culture (routine x 2)     Status: None (Preliminary result)   Collection Time: 07/22/20  8:22 PM   Specimen: BLOOD  Result Value Ref Range Status   Specimen Description BLOOD LEFT [facility]  Final   Special  Requests   Final    BOTTLES DRAWN AEROBIC AND ANAEROBIC Blood Culture adequate volume   Culture   Final    NO GROWTH <12 HOURS Performed at [facility], [address], [place], [place] [address]    Report Status PENDING  Incomplete         Radiology Studies: DG Chest Portable 1 View  Result Date: 07/22/2020 CLINICAL DATA:  Urinary catheter infection, EXAM: PORTABLE CHEST 1 VIEW COMPARISON:  06/23/2020 FINDINGS: Single frontal view of the chest demonstrates stable dual lead pacemaker. Continued ectasia and atherosclerosis of the thoracic aorta. Cardiac silhouette is unremarkable. No acute airspace disease, effusion, or pneumothorax. No acute bony abnormalities. IMPRESSION: 1. Stable exam, no acute process. Electronically Signed   By: Randa Ngo M.D.   On: 07/22/2020 20:10   CT RENAL STONE STUDY  Result Date: 07/23/2020 CLINICAL DATA:  Hematuria EXAM: CT ABDOMEN AND PELVIS WITHOUT CONTRAST TECHNIQUE: Multidetector CT imaging of the abdomen and pelvis was performed following the standard protocol without IV contrast. COMPARISON:  06/22/2020 FINDINGS: Lower chest: Bibasilar scarring. 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 No acute abnormality. Hepatobiliary: No focal hepatic abnormality. Gallbladder unremarkable. Pancreas: No focal abnormality or ductal dilatation. Spleen: No focal abnormality.  Normal size. Adrenals/Urinary Tract: Left lower pole cyst measures 5 cm, stable. Punctate nonobstructing stone in the midpole of the right kidney. No ureteral stones or hydronephrosis. Adrenal glands unremarkable. Suprapubic catheter and Foley catheter within the urinary bladder which is grossly unremarkable. Stomach/Bowel: Large stool burden throughout the colon. No bowel obstruction. Grossly unremarkable. 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 Aneurysm sac size measures up to 5.4 cm compared to 5.7 cm previously. No  adenopathy Reproductive: No gross abnormality. Other: No free fluid or free air. Musculoskeletal: No acute bony abnormality. IMPRESSION: Punctate right midpole renal stone.  No hydronephrosis. Suprapubic and Foley catheters within the urinary bladder, grossly unremarkable. No ureteral stones or hydronephrosis. Prior stent graft repair of AAA. Aneurysm sac size measures up to 5.4 cm compared to 5.7 cm previously. Large stool  burden. Bibasilar scarring. Electronically Signed   By: Rolm Baptise M.D.   On: 07/23/2020 08:37        Scheduled Meds: . amLODipine  5 mg Oral q morning  . aspirin EC  81 mg Oral Q0600  . atorvastatin  40 mg Oral QHS  . Chlorhexidine Gluconate Cloth  6 each Topical Daily  . donepezil  5 mg Oral QHS  . fluconazole  100 mg Oral Daily  . mometasone-formoterol  2 puff Inhalation BID  . oxybutynin  5 mg Oral TID  . pantoprazole  40 mg Oral BID  . sodium chloride flush  3 mL Intravenous Q12H  . tamsulosin  0.4 mg Oral Daily   Continuous Infusions: . piperacillin-tazobactam (ZOSYN)  IV 3.375 g (07/23/20 1217)     LOS: 0 days    Time spent: 35 minutes    Sidney Ace, MD [facility] Pager [phone]  If 7PM-7AM, please contact night-coverage 07/23/2020, 3:10 PM

## 2020-07-24 ENCOUNTER — Encounter: Payer: Self-pay | Admitting: Internal Medicine

## 2020-07-24 DIAGNOSIS — E43 Unspecified severe protein-calorie malnutrition: Secondary | ICD-10-CM | POA: Insufficient documentation

## 2020-07-24 DIAGNOSIS — R310 Gross hematuria: Secondary | ICD-10-CM

## 2020-07-24 DIAGNOSIS — N390 Urinary tract infection, site not specified: Secondary | ICD-10-CM

## 2020-07-24 DIAGNOSIS — T83511D Infection and inflammatory reaction due to indwelling urethral catheter, subsequent encounter: Secondary | ICD-10-CM

## 2020-07-24 LAB — URINE CULTURE: Culture: <10000 — AB

## 2020-07-24 MED ORDER — HALOPERIDOL LACTATE 5 MG/ML IJ SOLN
5.0000 mg | Freq: Four times a day (QID) | INTRAMUSCULAR | Status: DC | PRN
  Administered 2020-07-24: 5 mg via INTRAMUSCULAR
  Filled 2020-07-24: qty 1

## 2020-07-24 MED ORDER — ADULT MULTIVITAMIN W/MINERALS CH
1.0000 | ORAL_TABLET | Freq: Every day | ORAL | Status: DC
  Administered 2020-07-25 – 2020-07-26 (×2): 1 via ORAL
  Filled 2020-07-24 (×2): qty 1

## 2020-07-24 MED ORDER — DARIFENACIN HYDROBROMIDE ER 7.5 MG PO TB24
7.5000 mg | ORAL_TABLET | Freq: Every day | ORAL | Status: DC
  Administered 2020-07-25 – 2020-07-26 (×2): 7.5 mg via ORAL
  Filled 2020-07-24 (×2): qty 1

## 2020-07-24 MED ORDER — POLYETHYLENE GLYCOL 3350 17 G PO PACK
17.0000 g | PACK | Freq: Every day | ORAL | Status: DC
  Administered 2020-07-24 – 2020-07-25 (×2): 17 g via ORAL
  Filled 2020-07-24 (×2): qty 1

## 2020-07-24 MED ORDER — SENNOSIDES-DOCUSATE SODIUM 8.6-50 MG PO TABS
1.0000 | ORAL_TABLET | Freq: Two times a day (BID) | ORAL | Status: DC
  Administered 2020-07-24 – 2020-07-25 (×4): 1 via ORAL
  Filled 2020-07-24 (×4): qty 1

## 2020-07-24 MED ORDER — SODIUM CHLORIDE 0.9 % IV SOLN
200.0000 mg | Freq: Every day | INTRAVENOUS | Status: AC
  Administered 2020-07-24 – 2020-07-25 (×2): 200 mg via INTRAVENOUS
  Filled 2020-07-24 (×2): qty 10

## 2020-07-24 MED ORDER — ENSURE ENLIVE PO LIQD
237.0000 mL | Freq: Three times a day (TID) | ORAL | Status: DC
  Administered 2020-07-24 – 2020-07-25 (×3): 237 mL via ORAL

## 2020-07-24 NOTE — Progress Notes (Signed)
CSW left VM for patient's friend Pamala Hurry requesting return call to discuss discharge planning.   Oleh Genin, [place]

## 2020-07-24 NOTE — Progress Notes (Signed)
Pt getting increasingly agitated, paranoid. Has pulled IV out and will not allow RN to replace. He is attempting to call 911 because he "needs to get out of here". Pt redirected safely to bed but still agitated. MD notified. Bed alarms on, pt reassured he is in no danger. Will continue to monitor.

## 2020-07-24 NOTE — Progress Notes (Signed)
Patient alert,  Confused at times.Complained of lower abd pain, PRN's given with improvement. Foley in place, will continue to monitor.

## 2020-07-24 NOTE — TOC Initial Note (Signed)
Transition of Care [facility]) - Initial/Assessment Note    Patient Details  Name: James Dickerson MRN: [id number] Date of Birth: 12/21/1940  Transition of [facility]) CM/SW Contact:    Magnus Ivan, LCSW Phone Number: 07/24/2020, 10:55 AM  Clinical Narrative:          Per chart review, patient disoriented to situation. CSW spoke with daughter Susan Arana. Sissy reported patient lives at [facility] ([address], [place], [place] [address]- contact is Ivin Booty [phone]) and she plans for him to return there at DC. Patient was at [facility] for short-term rehab from 06/29/20-07/15/20, but discharged due to reaching his copay days. PCP is Dr. Brunetta Genera. Pharmacy [facility]. Patient had [facility] years ago, daughter unsure of agency used. Patient has home oxygen, daughter unsure of agency used. Family provides transportation. Patient has had both COVID vaccines + his booster vaccine. CSW will continue to follow for discharge planning.       Expected Discharge Plan: Group Home Barriers to Discharge: Continued Medical Work up   Patient Goals and CMS Choice Patient states their goals for this hospitalization and ongoing recovery are:: to return to group home CMS Medicare.gov Compare Post Acute Care list provided to:: Patient Represenative (must comment) Choice offered to / list presented to : Adult Children  Expected Discharge Plan and Services Expected Discharge Plan: Group Home       Living arrangements for the past 2 months: Group Home                                      Prior Living Arrangements/Services Living arrangements for the past 2 months: [place] Lives with:: Facility Resident Patient language and need for interpreter reviewed:: Yes Do you feel safe going back to the place where you live?: Yes      Need for Family Participation in Patient Care: Yes (Comment) Care giver support system in place?: Yes (comment) Current home services: DME Criminal  Activity/Legal Involvement Pertinent to Current Situation/Hospitalization: No - Comment as needed  Activities of Daily Living Home Assistive Devices/Equipment: Gilford Rile (specify type) ADL Screening (condition at time of admission) Patient's cognitive ability adequate to safely complete daily activities?: Yes Is the patient deaf or have difficulty hearing?: No Does the patient have difficulty seeing, even when wearing glasses/contacts?: No Does the patient have difficulty concentrating, remembering, or making decisions?: Yes Patient able to express need for assistance with ADLs?: Yes Does the patient have difficulty dressing or bathing?: Yes Independently performs ADLs?: Yes (appropriate for developmental age) Does the patient have difficulty walking or climbing stairs?: Yes Weakness of Legs: Both Weakness of Arms/Hands: None  Permission Sought/Granted Permission sought to share information with : Chartered certified accountant granted to share information with : Yes, Verbal Permission Granted     Permission granted to share info w AGENCY: [facility] [place], DME agencies, [facility] agencies, SNFs        Emotional Assessment       Orientation: : Fluctuating Orientation (Suspected and/or reported Sundowners) Alcohol / Substance Use: Not Applicable Psych Involvement: No (comment)  Admission diagnosis:  UTI (urinary tract infection) [N39.0] Hematuria [R31.9] Urinary tract infection without hematuria, site unspecified [N39.0] Sepsis, due to unspecified organism, unspecified whether acute organ dysfunction present [facility]) [A41.9] Patient Active Problem List   Diagnosis Date Noted  . Hematuria 07/23/2020  . UTI (urinary tract infection) 07/22/2020  .  Foley catheter in place 07/22/2020  . GERD (gastroesophageal reflux disease) 07/22/2020  . Elevated lactic acid level 07/22/2020  . Gross hematuria 07/22/2020  . AAA (abdominal aortic aneurysm) ([place]) 06/23/2020  . Status post  surgery 06/22/2020  . Thoracic aortic aneurysm without rupture ([place]) 12/18/2019  . Respiratory failure ([place]) 07/11/2017  . Acute respiratory failure ([place]) 07/10/2017  . CAP (community acquired pneumonia) 07/10/2017  . Influenza A 07/10/2017  . COPD (chronic obstructive pulmonary disease) ([place]) 07/10/2017  . HLD (hyperlipidemia) 03/09/2017  . Essential hypertension 08/14/2016  . 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 Bradycardia 08/05/2016   PCP:  Lorelee Market, MD [facility]acy[facility]   [id number] [place], [place] - [address] [place] [address] Phone: [phone] Fax: [phone]     Social Determinants of Health (SDOH) Interventions    Readmission Risk Interventions No flowsheet data found.

## 2020-07-24 NOTE — Progress Notes (Signed)
Pt becoming increasingly confused, currently only oriented to self. Pt is impulsive and attempting to get OOB without assistance.  MD notified. Bed alarm on, call light within reach, possessions  And tolieting addressed.  Will continue to monitor.

## 2020-07-24 NOTE — Progress Notes (Signed)
MD orders to IM haldol. Pt extremely paranoid and attempting to leave. Haldol administered, pt to be moved to room closer to RN station. Pt safely in bed, alarm on, tolerating supervision by RN. Will continue to monitor.

## 2020-07-24 NOTE — Progress Notes (Signed)
Initial Nutrition Assessment  DOCUMENTATION CODES:   Severe malnutrition in context of chronic illness  INTERVENTION:  Recommend liberalizing diet to regular.  Provide Ensure Enlive po TID, each supplement provides 350 kcal and 20 grams of protein. Patient prefers strawberry.  Provide MVI po daily.  NUTRITION DIAGNOSIS:   Severe Malnutrition related to chronic illness (COPD, CKD) as evidenced by severe fat depletion,severe muscle depletion.  GOAL:   Patient will meet greater than or equal to 90% of their needs  MONITOR:   PO intake,Supplement acceptance,Labs,Weight trends,I & O's  REASON FOR ASSESSMENT:   Malnutrition Screening Tool    ASSESSMENT:   80 year old male with PMHx of cardiomegaly, HTN, CKD, GERD, COPD, recurrent prostate cancer s/p external beam radiation therapy admitted with UTI, acute on chronic anemia, constipation.   Met with patient at bedside. He reports his appetite is decreased here in the hospital as he dislikes the food. He reports his appetite is good at his family care home. He reports he typically eats 2 meals per day. However, he reports he often will only eat cereal for both meals. Occasionally for his second meal he'll have a well-balanced meal with a good source of protein. Discussed importance of adequate intake. Encouraged patient to eat a good source of protein at each meal and discussed which foods contain protein. Patient reports he likes Ensure and is willing to drink these to help meet calorie/protein needs.   Patient reports his UBW was 145-150 lbs and he has lost weight over time. He reports he lost weight slowly over time. RD obtained bed scale weight today of 52.1 kg (114.86 lbs).  Medications reviewed and include: Diflucan, Protonix, Miralax, senna-docusate 1 tablet BID, iron sucrose 200 mg daily IV, Zosyn.  Labs reviewed: Chloride 114, CO2 20.  NUTRITION - FOCUSED PHYSICAL EXAM:  Flowsheet Row Most Recent Value  Orbital Region  Severe depletion  Upper Arm Region Severe depletion  Thoracic and Lumbar Region Severe depletion  Buccal Region Severe depletion  Temple Region Severe depletion  Clavicle Bone Region Severe depletion  Clavicle and Acromion Bone Region Severe depletion  Scapular Bone Region Severe depletion  Dorsal Hand Severe depletion  Patellar Region Severe depletion  Anterior Thigh Region Severe depletion  Posterior Calf Region Severe depletion  Edema (RD Assessment) None  Hair Reviewed  Eyes Reviewed  Mouth Reviewed  Skin Reviewed  Nails Reviewed     Diet Order:   Diet Order            Diet Heart Room service appropriate? Yes; Fluid consistency: Thin  Diet effective now                EDUCATION NEEDS:   No education needs have been identified at this time  Skin:  Skin Assessment: Reviewed RN Assessment  Last BM:  Unknown  Height:   Ht Readings from Last 1 Encounters:  07/22/20 _0  (1.6 m)   Weight:   Wt Readings from Last 1 Encounters:  07/24/20 52.1 kg   BMI:  Body mass index is 20.35 kg/m.  Estimated Nutritional Needs:   Kcal:  1500-1700  Protein:  75-85 grams  Fluid:  1.5 L/day  Jacklynn Barnacle, MS, RD, LDN Pager number available on Amion

## 2020-07-24 NOTE — Progress Notes (Addendum)
Progress Note    James Dickerson  ZOX:[id number] DOB: [birth year]  DOA: 07/22/2020 PCP: Lorelee Market, MD      Brief Narrative:    Medical records reviewed and are as summarized below:  James Dickerson is a 80 y.o. male       Assessment/Plan:   Principal Problem:   UTI (urinary tract infection) Active Problems:   COPD (chronic obstructive pulmonary disease) ([place])   Essential hypertension   HLD (hyperlipidemia)   Foley catheter in place   GERD (gastroesophageal reflux disease)   Elevated lactic acid level   Gross hematuria   Hematuria    Body mass index is 19.31 kg/m.    Acute complicated, catheter associated UTI, gross hematuria: Sepsis ruled out.  Continue IV Zosyn and Flomax. Analgesics as needed for pain.  Follow-up with urologist.  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  He will be given IV iron infusion.  No indication for blood transfusion at this time.  Monitor H&H.  COPD: Compensated. continue bronchodilators.  Borderline low blood pressure: hold amlodipine  Constipation/large stool burden on CT scan: Treat with MiraLAX and Senokot  Hoarse voice: He attributes this to aneurysm  Other comorbidities hypertension, hyperlipidemia, status post pacemaker for second-degree heart block, GERD   ADDENDUM  Patient became confused and agitated later in the day.  According to his nurse, he pulled out his IV and threatened to leave the hospital.  Etiology of acute confusional state is not clear but it may be due to oxybutynin.  Discontinue oxybutynin and start trospium.  Use Haldol as needed for agitation.   Diet Order            Diet Heart Room service appropriate? Yes; Fluid  consistency: Thin  Diet effective now                    Consultants:  Urologist  Procedures:  None    Medications:   . aspirin EC  81 mg Oral Q0600  . atorvastatin  40 mg Oral QHS  . Chlorhexidine Gluconate Cloth  6 each Topical Daily  . donepezil  5 mg Oral QHS  . fluconazole  100 mg Oral Daily  . mometasone-formoterol  2 puff Inhalation BID  . oxybutynin  5 mg Oral TID  . pantoprazole  40 mg Oral BID  . polyethylene glycol  17 g Oral Daily  . senna-docusate  1 tablet Oral BID  . sodium chloride flush  3 mL Intravenous Q12H  . tamsulosin  0.4 mg Oral Daily   Continuous Infusions: . piperacillin-tazobactam (ZOSYN)  IV 3.375 g (07/24/20 0604)     Anti-infectives (From admission, onward)   Start     Dose/Rate Route Frequency Ordered Stop   07/23/20 1600  fluconazole (DIFLUCAN) tablet 100 mg        100 mg Oral Daily 07/23/20 1503     07/23/20 0500  piperacillin-tazobactam (ZOSYN) IVPB 3.375 g        3.375 g 12.5 mL/hr over 240 Minutes Intravenous Every 8 hours 07/23/20 0147     07/22/20 2230  piperacillin-tazobactam (ZOSYN) IVPB 3.375 g        3.375 g 100 mL/hr over 30 Minutes Intravenous  Once 07/22/20 2215 07/22/20 2346  Family Communication/Anticipated D/C date and plan/Code Status   DVT prophylaxis: SCDs Start: 07/22/20 2312     Code Status: Full Code  Family Communication: None Disposition Plan:    Status is: Inpatient  Remains inpatient appropriate because:IV treatments appropriate due to intensity of illness or inability to take PO   Dispo: The patient is from: Home              Anticipated d/c is to: Home              Patient currently is not medically stable to d/c.   Difficult to place patient No           Subjective:   Interval events noted.  C/O abdominal pain  Objective:    Vitals:   07/24/20 0120 07/24/20 0440 07/24/20 0749 07/24/20 1100  BP: 115/72 107/72 109/73 111/75  Pulse: 68 60 64 66   Resp: 20 18 18 18   Temp: 98.3 F (36.8 C) 98.8 F (37.1 C) 98.4 F (36.9 C) 98.5 F (36.9 C)  TempSrc:   Oral Oral  SpO2: 100% 96% 98% 98%  Weight:      Height:       No data found.   Intake/Output Summary (Last 24 hours) at 07/24/2020 1334 Last data filed at 07/24/2020 1000 Gross per 24 hour  Intake --  Output 2350 ml  Net -2350 ml   Filed Weights   07/22/20 1956  Weight: 49.4 kg    Exam:  GEN: NAD SKIN: Warm and dry EYES: EOMI ENT: MMM.  Hoarse voice CV: RRR PULM: CTA B ABD: soft, ND, NT, +BS CNS: AAO x 3, non focal EXT: No edema or tenderness GU: Suprapubic catheter draining bloody urine        Data Reviewed:   I have personally reviewed following labs and imaging studies:  Labs: Labs show the following:   Basic Metabolic Panel: Recent Labs  Lab 07/18/20 1213 07/22/20 2017 07/23/20 0430  NA 137 139 138  K 4.0 3.8 3.8  CL 105 109 114*  CO2 24 18* 20*  GLUCOSE 103* 184* 118*  BUN 20 19 17   CREATININE 1.09 1.08 0.89  CALCIUM 9.3 9.0 8.3*   GFR Estimated Creatinine Clearance: 46.3 mL/min (by C-G formula based on SCr of 0.89 mg/dL). Liver Function Tests: Recent Labs  Lab 07/22/20 2017 07/23/20 0430  AST 18 11*  ALT 15 13  ALKPHOS 68 52  BILITOT 0.8 0.6  PROT 7.1 5.5*  ALBUMIN 3.2* 2.6*   No results for input(s): LIPASE, AMYLASE in the last 168 hours. No results for input(s): AMMONIA in the last 168 hours. Coagulation profile Recent Labs  Lab 07/22/20 2017  INR 1.2    CBC: Recent Labs  Lab 07/18/20 1213 07/22/20 2017 07/23/20 0430 07/23/20 1500  WBC 7.0 7.5 7.8  --   NEUTROABS  --  6.6  --   --   HGB 11.5* 10.3* 8.6* 8.7*  HCT 34.2* 32.3* 25.4*  --   MCV 92.9 94.4 92.7  --   PLT 257 246 188  --    Cardiac Enzymes: No results for input(s): CKTOTAL, CKMB, CKMBINDEX, TROPONINI in the last 168 hours. BNP (last 3 results) No results for input(s): PROBNP in the last 8760 hours. CBG: No results for input(s): GLUCAP in the  last 168 hours. D-Dimer: No results for input(s): DDIMER in the last 72 hours. Hgb A1c: No results for input(s): HGBA1C in the last 72 hours. Lipid Profile: No results for  input(s): CHOL, HDL, LDLCALC, TRIG, CHOLHDL, LDLDIRECT in the last 72 hours. Thyroid function studies: No results for input(s): TSH, T4TOTAL, T3FREE, THYROIDAB in the last 72 hours.  Invalid input(s): FREET3 Anemia work up: Recent Labs    07/22/20 2130  FERRITIN 219  TIBC 255  IRON 39*   Sepsis Labs: Recent Labs  Lab 07/18/20 1213 07/22/20 2017 07/22/20 2130 07/23/20 0059 07/23/20 0430  WBC 7.0 7.5  --   --  7.8  LATICACIDVEN  --  4.0* 4.1* 1.5  --     Microbiology Recent Results (from the past 240 hour(s))  Urine culture     Status: Abnormal   Collection Time: 07/18/20  3:21 PM   Specimen: Urine, Catheterized  Result Value Ref Range Status   Specimen Description   Final    URINE, CATHETERIZED Performed at [facility], [address] [place], [place] [address]    Special Requests   Final    NONE Performed at [facility], [address] [place], [place] [address]    Culture >=100,000 COLONIES/mL PSEUDOMONAS AERUGINOSA (A)  Final   Report Status 07/21/2020 FINAL  Final   Organism ID, Bacteria PSEUDOMONAS AERUGINOSA (A)  Final      Susceptibility   Pseudomonas aeruginosa - MIC*    CEFTAZIDIME <=1 SENSITIVE Sensitive     CIPROFLOXACIN <=0.25 SENSITIVE Sensitive     GENTAMICIN <=1 SENSITIVE Sensitive     IMIPENEM 1 SENSITIVE Sensitive     PIP/TAZO <=4 SENSITIVE Sensitive     CEFEPIME 2 SENSITIVE Sensitive     * >=100,000 COLONIES/mL PSEUDOMONAS AERUGINOSA  Resp Panel by RT-PCR (Flu A&B, Covid) Nasopharyngeal Swab     Status: None   Collection Time: 07/22/20  8:17 PM   Specimen: Nasopharyngeal Swab; Nasopharyngeal(NP) swabs in vial transport medium  Result Value Ref Range Status   SARS Coronavirus 2 by RT PCR NEGATIVE NEGATIVE Final    Comment: (NOTE) SARS-CoV-2  target nucleic acids are NOT DETECTED.  The SARS-CoV-2 RNA is generally detectable in upper respiratory specimens during the acute phase of infection. The lowest concentration of SARS-CoV-2 viral copies this assay can detect is 138 copies/mL. A negative result does not preclude SARS-Cov-2 infection and should not be used as the sole basis for treatment or other patient management decisions. A negative result may occur with  improper specimen collection/handling, submission of specimen other than nasopharyngeal swab, presence of viral mutation(s) within the areas targeted by this assay, and inadequate number of viral copies(<138 copies/mL). A negative result must be combined with clinical observations, patient history, and epidemiological information. The expected result is Negative.  Fact Sheet for Patients:  EntrepreneurPulse.com.au  Fact Sheet for Healthcare Providers:  IncredibleEmployment.be  This test is no t yet approved or cleared by the [place] FDA and  has been authorized for detection and/or diagnosis of SARS-CoV-2 by FDA under an Emergency Use Authorization (EUA). This EUA will remain  in effect (meaning this test can be used) for the duration of the COVID-19 declaration under Section 564(b)(1) of the Act, 21 U.S.C.section 360bbb-3(b)(1), unless the authorization is terminated  or revoked sooner.       Influenza A by PCR NEGATIVE NEGATIVE Final   Influenza B by PCR NEGATIVE NEGATIVE Final    Comment: (NOTE) The Xpert Xpress SARS-CoV-2/FLU/RSV plus assay is intended as an aid in the diagnosis of influenza from Nasopharyngeal swab specimens and should not be used as a sole basis for treatment. Nasal washings and aspirates are unacceptable  for Xpert Xpress SARS-CoV-2/FLU/RSV testing.  Fact Sheet for Patients: EntrepreneurPulse.com.au  Fact Sheet for Healthcare  Providers: IncredibleEmployment.be  This test is not yet approved or cleared by the [place] FDA and has been authorized for detection and/or diagnosis of SARS-CoV-2 by FDA under an Emergency Use Authorization (EUA). This EUA will remain in effect (meaning this test can be used) for the duration of the COVID-19 declaration under Section 564(b)(1) of the Act, 21 U.S.C. section 360bbb-3(b)(1), unless the authorization is terminated or revoked.  Performed at [facility], [address] [place], [place] [address]   Blood culture (routine x 2)     Status: None (Preliminary result)   Collection Time: 07/22/20  8:22 PM   Specimen: BLOOD  Result Value Ref Range Status   Specimen Description BLOOD RIGHT [facility]  Final   Special Requests   Final    BOTTLES DRAWN AEROBIC AND ANAEROBIC Blood Culture adequate volume   Culture   Final    NO GROWTH 1 DAY Performed at [facility], [address] [place], [place] [address]    Report Status PENDING  Incomplete  Blood culture (routine x 2)     Status: None (Preliminary result)   Collection Time: 07/22/20  8:22 PM   Specimen: BLOOD  Result Value Ref Range Status   Specimen Description BLOOD LEFT [facility]  Final   Special Requests   Final    BOTTLES DRAWN AEROBIC AND ANAEROBIC Blood Culture adequate volume   Culture   Final    NO GROWTH 1 DAY Performed at [facility], [address] [place], [place] [address]    Report Status PENDING  Incomplete  Urine culture     Status: Abnormal   Collection Time: 07/22/20 11:15 PM   Specimen: Urine, Random  Result Value Ref Range Status   Specimen Description   Final    URINE, RANDOM Performed at [facility], [address], [place], [place] [address]    Special Requests   Final    NONE Performed at [facility], [address] [place], [place] [address]    Culture (A)  Final    <10,000 COLONIES/mL INSIGNIFICANT GROWTH Performed  at [facility], [place] [address], [place], [place] [address]    Report Status 07/24/2020 FINAL  Final    Procedures and diagnostic studies:  DG Chest Portable 1 View  Result Date: 07/22/2020 CLINICAL DATA:  Urinary catheter infection, EXAM: PORTABLE CHEST 1 VIEW COMPARISON:  06/23/2020 FINDINGS: Single frontal view of the chest demonstrates stable dual lead pacemaker. Continued ectasia and atherosclerosis of the thoracic aorta. Cardiac silhouette is unremarkable. No acute airspace disease, effusion, or pneumothorax. No acute bony abnormalities. IMPRESSION: 1. Stable exam, no acute process. Electronically Signed   By: Randa Ngo M.D.   On: 07/22/2020 20:10   CT RENAL STONE STUDY  Result Date: 07/23/2020 CLINICAL DATA:  Hematuria EXAM: CT ABDOMEN AND PELVIS WITHOUT CONTRAST TECHNIQUE: Multidetector CT imaging of the abdomen and pelvis was performed following the standard protocol without IV contrast. COMPARISON:  06/22/2020 FINDINGS: Lower chest: Bibasilar scarring. 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 No acute abnormality. Hepatobiliary: No focal hepatic abnormality. Gallbladder unremarkable. Pancreas: No focal abnormality or ductal dilatation. Spleen: No focal abnormality.  Normal size. Adrenals/Urinary Tract: Left lower pole cyst measures 5 cm, stable. Punctate nonobstructing stone in the midpole of the right kidney. No ureteral stones or hydronephrosis. Adrenal glands unremarkable. Suprapubic catheter and  Foley catheter within the urinary bladder which is grossly unremarkable. Stomach/Bowel: Large stool burden throughout the colon. No bowel obstruction. Grossly unremarkable. 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 Aneurysm sac size measures up to 5.4 cm compared to 5.7 cm previously. No adenopathy Reproductive: No gross abnormality. Other: No free fluid or free air. Musculoskeletal: No acute bony abnormality. IMPRESSION:  Punctate right midpole renal stone.  No hydronephrosis. Suprapubic and Foley catheters within the urinary bladder, grossly unremarkable. No ureteral stones or hydronephrosis. Prior stent graft repair of AAA. Aneurysm sac size measures up to 5.4 cm compared to 5.7 cm previously. Large stool burden. Bibasilar scarring. Electronically Signed   By: Rolm Baptise M.D.   On: 07/23/2020 08:37               LOS: 1 day   Shoshana Johal  [facility]   Pager on [email or web address]. If 7PM-7AM, please contact night-coverage at [email or web address]     07/24/2020, 1:34 PM

## 2020-07-25 ENCOUNTER — Other Ambulatory Visit: Payer: Self-pay | Admitting: Vascular Surgery

## 2020-07-25 DIAGNOSIS — I712 Thoracic aortic aneurysm, without rupture, unspecified: Secondary | ICD-10-CM

## 2020-07-25 DIAGNOSIS — I714 Abdominal aortic aneurysm, without rupture, unspecified: Secondary | ICD-10-CM

## 2020-07-25 LAB — CBC WITH DIFFERENTIAL/PLATELET
Abs Immature Granulocytes: 0.04 10*3/uL (ref 0.00–0.07)
Basophils Absolute: 0 10*3/uL (ref 0.0–0.1)
Basophils Relative: 1 %
Eosinophils Absolute: 0.3 10*3/uL (ref 0.0–0.5)
Eosinophils Relative: 5 %
HCT: 28.8 % — ABNORMAL LOW (ref 39.0–52.0)
Hemoglobin: 9.7 g/dL — ABNORMAL LOW (ref 13.0–17.0)
Immature Granulocytes: 1 %
Lymphocytes Relative: 25 %
Lymphs Abs: 1.4 10*3/uL (ref 0.7–4.0)
MCH: 31 pg (ref 26.0–34.0)
MCHC: 33.7 g/dL (ref 30.0–36.0)
MCV: 92 fL (ref 80.0–100.0)
Monocytes Absolute: 0.6 10*3/uL (ref 0.1–1.0)
Monocytes Relative: 11 %
Neutro Abs: 3.2 10*3/uL (ref 1.7–7.7)
Neutrophils Relative %: 57 %
Platelets: 245 10*3/uL (ref 150–400)
RBC: 3.13 MIL/uL — ABNORMAL LOW (ref 4.22–5.81)
RDW: 13.7 % (ref 11.5–15.5)
WBC: 5.6 10*3/uL (ref 4.0–10.5)
nRBC: 0 % (ref 0.0–0.2)

## 2020-07-25 LAB — BASIC METABOLIC PANEL
Anion gap: 7 (ref 5–15)
BUN: 13 mg/dL (ref 8–23)
CO2: 23 mmol/L (ref 22–32)
Calcium: 9.1 mg/dL (ref 8.9–10.3)
Chloride: 108 mmol/L (ref 98–111)
Creatinine, Ser: 1.12 mg/dL (ref 0.61–1.24)
GFR, Estimated: >60 mL/min (ref >60)
Glucose, Bld: 78 mg/dL (ref 70–99)
Potassium: 3.7 mmol/L (ref 3.5–5.1)
Sodium: 138 mmol/L (ref 135–145)

## 2020-07-25 LAB — GLUCOSE, CAPILLARY
Glucose-Capillary: 73 mg/dL (ref 70–99)
Glucose-Capillary: 172 mg/dL — ABNORMAL HIGH (ref 70–99)

## 2020-07-25 MED ORDER — SALINE SPRAY 0.65 % NA SOLN
1.0000 | NASAL | Status: DC | PRN
  Filled 2020-07-25: qty 44

## 2020-07-25 NOTE — Progress Notes (Addendum)
Patient alert and calm, impulsive a times, sitter at bedside,no complaints of pain , VSS, will continue to monitor.

## 2020-07-25 NOTE — Progress Notes (Signed)
Progress Note    James Dickerson  WRU:[id number] DOB: 1941-02-01  DOA: 07/22/2020 PCP: Lorelee Market, MD      Brief Narrative:    Medical records reviewed and are as summarized below:  James Dickerson is a 80 y.o. male       Assessment/Plan:   Principal Problem:   UTI (urinary tract infection) Active Problems:   COPD (chronic obstructive pulmonary disease) ([place])   Essential hypertension   HLD (hyperlipidemia)   Foley catheter in place   GERD (gastroesophageal reflux disease)   Elevated lactic acid level   Gross hematuria   Hematuria   Protein-calorie malnutrition, severe    Body mass index is 20.35 kg/m.    Acute complicated, catheter associated UTI, gross hematuria: Sepsis ruled out.  Continue IV Zosyn, trospium and Flomax.  Plan to change to oral Levaquin tomorrow.  Analgesics as needed for pain.  Analgesics as needed for pain.   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  S/p IV iron infusion on 07/24/2020 and 07/25/2020.  No indication for blood transfusion at this time.  Monitor H&H.  Delirium/acute toxic encephalopathy: Improved.  It is not clear whether this was due to oxybutynin.  Haldol as needed for agitation.  COPD: Compensated. continue bronchodilators.  Borderline low blood pressure: Amlodipine is still on hold.  Plan to resume amlodipine tomorrow.  Constipation/large stool burden on CT scan: Continue laxatives.  Hoarse voice: He attributes this to aneurysm  Other comorbidities hypertension, hyperlipidemia, status post pacemaker for second-degree heart block, GERD  Plan to discharge home tomorrow.   Diet Order            Diet regular Room service appropriate? Yes;  Fluid consistency: Thin  Diet effective now                    Consultants:  Urologist  Procedures:  None    Medications:   . aspirin EC  81 mg Oral Q0600  . atorvastatin  40 mg Oral QHS  . Chlorhexidine Gluconate Cloth  6 each Topical Daily  . darifenacin  7.5 mg Oral Daily  . donepezil  5 mg Oral QHS  . feeding supplement  237 mL Oral TID BM  . fluconazole  100 mg Oral Daily  . mometasone-formoterol  2 puff Inhalation BID  . multivitamin with minerals  1 tablet Oral Daily  . pantoprazole  40 mg Oral BID  . polyethylene glycol  17 g Oral Daily  . senna-docusate  1 tablet Oral BID  . sodium chloride flush  3 mL Intravenous Q12H  . tamsulosin  0.4 mg Oral Daily   Continuous Infusions: . piperacillin-tazobactam (ZOSYN)  IV 3.375 g (07/25/20 0529)     Anti-infectives (From admission, onward)   Start     Dose/Rate Route Frequency Ordered Stop   07/23/20 1600  fluconazole (DIFLUCAN) tablet 100 mg        100 mg Oral Daily 07/23/20 1503     07/23/20 0500  piperacillin-tazobactam (ZOSYN) IVPB 3.375 g        3.375 g 12.5 mL/hr over 240 Minutes Intravenous Every 8 hours 07/23/20 0147     07/22/20 2230  piperacillin-tazobactam (ZOSYN) IVPB 3.375 g  3.375 g 100 mL/hr over 30 Minutes Intravenous  Once 07/22/20 2215 07/22/20 2346             Family Communication/Anticipated D/C date and plan/Code Status   DVT prophylaxis: SCDs Start: 07/22/20 2312     Code Status: Full Code  Family Communication: Discussed with Alvie Heidelberg, sister, at the bedside Disposition Plan:    Status is: Inpatient  Remains inpatient appropriate because:IV treatments appropriate due to intensity of illness or inability to take PO   Dispo: The patient is from: Home              Anticipated d/c is to: Home              Patient currently is not medically stable to d/c.   Difficult to place patient No           Subjective:   Interval events noted.  He was more  confused and agitated yesterday.  He feels better today.  His sister is at the bedside.  Objective:    Vitals:   07/24/20 2131 07/25/20 0232 07/25/20 0956 07/25/20 1133  BP: 118/68 106/68 107/67 (!) 125/91  Pulse: 75 67 81 81  Resp: 18 18 17 19   Temp: 98.6 F (37 C) 98.3 F (36.8 C) 98.7 F (37.1 C) 98.1 F (36.7 C)  TempSrc:      SpO2: 96% 100% 96% 97%  Weight:      Height:       No data found.   Intake/Output Summary (Last 24 hours) at 07/25/2020 1344 Last data filed at 07/25/2020 1047 Gross per 24 hour  Intake 120 ml  Output 2350 ml  Net -2230 ml   Filed Weights   07/22/20 1956 07/24/20 1424  Weight: 49.4 kg 52.1 kg    Exam:  GEN: NAD SKIN: No rash EYES: EOMI ENT: MMM CV: RRR PULM: CTA B ABD: soft, ND, NT, +BS CNS: AAO x 3, non focal EXT: No edema or tenderness GU: Suprapubic catheter draining bloody urine     Data Reviewed:   I have personally reviewed following labs and imaging studies:  Labs: Labs show the following:   Basic Metabolic Panel: Recent Labs  Lab 07/22/20 2017 07/23/20 0430 07/25/20 0537  NA 139 138 138  K 3.8 3.8 3.7  CL 109 114* 108  CO2 18* 20* 23  GLUCOSE 184* 118* 78  BUN 19 17 13   CREATININE 1.08 0.89 1.12  CALCIUM 9.0 8.3* 9.1   GFR Estimated Creatinine Clearance: 38.8 mL/min (by C-G formula based on SCr of 1.12 mg/dL). Liver Function Tests: Recent Labs  Lab 07/22/20 2017 07/23/20 0430  AST 18 11*  ALT 15 13  ALKPHOS 68 52  BILITOT 0.8 0.6  PROT 7.1 5.5*  ALBUMIN 3.2* 2.6*   No results for input(s): LIPASE, AMYLASE in the last 168 hours. No results for input(s): AMMONIA in the last 168 hours. Coagulation profile Recent Labs  Lab 07/22/20 2017  INR 1.2    CBC: Recent Labs  Lab 07/22/20 2017 07/23/20 0430 07/23/20 1500 07/25/20 0537  WBC 7.5 7.8  --  5.6  NEUTROABS 6.6  --   --  3.2  HGB 10.3* 8.6* 8.7* 9.7*  HCT 32.3* 25.4*  --  28.8*  MCV 94.4 92.7  --  92.0  PLT 246 188  --  245    Cardiac Enzymes: No results for input(s): CKTOTAL, CKMB, CKMBINDEX, TROPONINI in the last 168 hours. BNP (last 3 results) No results for input(s):  PROBNP in the last 8760 hours. CBG: Recent Labs  Lab 07/25/20 0839 07/25/20 1132  GLUCAP 73 172*   D-Dimer: No results for input(s): DDIMER in the last 72 hours. Hgb A1c: No results for input(s): HGBA1C in the last 72 hours. Lipid Profile: No results for input(s): CHOL, HDL, LDLCALC, TRIG, CHOLHDL, LDLDIRECT in the last 72 hours. Thyroid function studies: No results for input(s): TSH, T4TOTAL, T3FREE, THYROIDAB in the last 72 hours.  Invalid input(s): FREET3 Anemia work up: Recent Labs    07/22/20 2130  FERRITIN 219  TIBC 255  IRON 39*   Sepsis Labs: Recent Labs  Lab 07/22/20 2017 07/22/20 2130 07/23/20 0059 07/23/20 0430 07/25/20 0537  WBC 7.5  --   --  7.8 5.6  LATICACIDVEN 4.0* 4.1* 1.5  --   --     Microbiology Recent Results (from the past 240 hour(s))  Urine culture     Status: Abnormal   Collection Time: 07/18/20  3:21 PM   Specimen: Urine, Catheterized  Result Value Ref Range Status   Specimen Description   Final    URINE, CATHETERIZED Performed at [facility], [address] [place], [place] [address]    Special Requests   Final    NONE Performed at [facility], [address] [place], [place] [address]    Culture >=100,000 COLONIES/mL PSEUDOMONAS AERUGINOSA (A)  Final   Report Status 07/21/2020 FINAL  Final   Organism ID, Bacteria PSEUDOMONAS AERUGINOSA (A)  Final      Susceptibility   Pseudomonas aeruginosa - MIC*    CEFTAZIDIME <=1 SENSITIVE Sensitive     CIPROFLOXACIN <=0.25 SENSITIVE Sensitive     GENTAMICIN <=1 SENSITIVE Sensitive     IMIPENEM 1 SENSITIVE Sensitive     PIP/TAZO <=4 SENSITIVE Sensitive     CEFEPIME 2 SENSITIVE Sensitive     * >=100,000 COLONIES/mL PSEUDOMONAS AERUGINOSA  Resp Panel by RT-PCR (Flu A&B, Covid) Nasopharyngeal Swab     Status: None    Collection Time: 07/22/20  8:17 PM   Specimen: Nasopharyngeal Swab; Nasopharyngeal(NP) swabs in vial transport medium  Result Value Ref Range Status   SARS Coronavirus 2 by RT PCR NEGATIVE NEGATIVE Final    Comment: (NOTE) SARS-CoV-2 target nucleic acids are NOT DETECTED.  The SARS-CoV-2 RNA is generally detectable in upper respiratory specimens during the acute phase of infection. The lowest concentration of SARS-CoV-2 viral copies this assay can detect is 138 copies/mL. A negative result does not preclude SARS-Cov-2 infection and should not be used as the sole basis for treatment or other patient management decisions. A negative result may occur with  improper specimen collection/handling, submission of specimen other than nasopharyngeal swab, presence of viral mutation(s) within the areas targeted by this assay, and inadequate number of viral copies(<138 copies/mL). A negative result must be combined with clinical observations, patient history, and epidemiological information. The expected result is Negative.  Fact Sheet for Patients:  EntrepreneurPulse.com.au  Fact Sheet for Healthcare Providers:  IncredibleEmployment.be  This test is no t yet approved or cleared by the [place] FDA and  has been authorized for detection and/or diagnosis of SARS-CoV-2 by FDA under an Emergency Use Authorization (EUA). This EUA will remain  in effect (meaning this test can be used) for the duration of the COVID-19 declaration under Section 564(b)(1) of the Act, 21 U.S.C.section 360bbb-3(b)(1), unless the authorization is terminated  or revoked sooner.       Influenza A by PCR NEGATIVE NEGATIVE Final  Influenza B by PCR NEGATIVE NEGATIVE Final    Comment: (NOTE) The Xpert Xpress SARS-CoV-2/FLU/RSV plus assay is intended as an aid in the diagnosis of influenza from Nasopharyngeal swab specimens and should not be used as a sole basis for treatment.  Nasal washings and aspirates are unacceptable for Xpert Xpress SARS-CoV-2/FLU/RSV testing.  Fact Sheet for Patients: EntrepreneurPulse.com.au  Fact Sheet for Healthcare Providers: IncredibleEmployment.be  This test is not yet approved or cleared by the [place] FDA and has been authorized for detection and/or diagnosis of SARS-CoV-2 by FDA under an Emergency Use Authorization (EUA). This EUA will remain in effect (meaning this test can be used) for the duration of the COVID-19 declaration under Section 564(b)(1) of the Act, 21 U.S.C. section 360bbb-3(b)(1), unless the authorization is terminated or revoked.  Performed at [facility], [address] [place], [place] [address]   Blood culture (routine x 2)     Status: None (Preliminary result)   Collection Time: 07/22/20  8:22 PM   Specimen: BLOOD  Result Value Ref Range Status   Specimen Description BLOOD RIGHT [facility]  Final   Special Requests   Final    BOTTLES DRAWN AEROBIC AND ANAEROBIC Blood Culture adequate volume   Culture   Final    NO GROWTH 2 DAYS Performed at [facility], [address], [place], [place] [address]    Report Status PENDING  Incomplete  Blood culture (routine x 2)     Status: None (Preliminary result)   Collection Time: 07/22/20  8:22 PM   Specimen: BLOOD  Result Value Ref Range Status   Specimen Description BLOOD LEFT [facility]  Final   Special Requests   Final    BOTTLES DRAWN AEROBIC AND ANAEROBIC Blood Culture adequate volume   Culture   Final    NO GROWTH 2 DAYS Performed at [facility], [address] [place], [place] [address]    Report Status PENDING  Incomplete  Urine culture     Status: Abnormal   Collection Time: 07/22/20 11:15 PM   Specimen: Urine, Random  Result Value Ref Range Status   Specimen Description   Final    URINE, RANDOM Performed at [facility], [address] [place], [place] [address]     Special Requests   Final    NONE Performed at [facility] [address], [place], [place] [address]    Culture (A)  Final    <10,000 COLONIES/mL INSIGNIFICANT GROWTH Performed at [facility], [place] [address], [place], [place] [address]    Report Status 07/24/2020 FINAL  Final    Procedures and diagnostic studies:  No results found.             LOS: 2 days   BERNARD AYIKU  [facility]   Pager on [email or web address]. If 7PM-7AM, please contact night-coverage at [email or web address]     07/25/2020, 1:44 PM

## 2020-07-26 LAB — CREATININE, SERUM
Creatinine, Ser: 1.06 mg/dL (ref 0.61–1.24)
GFR, Estimated: >60 mL/min (ref >60)

## 2020-07-26 MED ORDER — MIRABEGRON ER 25 MG PO TB24: 25.0000 mg | ORAL_TABLET | Freq: Every day | ORAL | 0 refills | Status: DC

## 2020-07-26 NOTE — TOC Progression Note (Signed)
Transition of Care [facility]) - Progression Note    Patient Details  Name: James Dickerson MRN: [id number] Date of Birth: Sep 25, 1940  Transition of Care [facility]) CM/SW Contact  Shelbie Hutching, RN Phone Number: 07/26/2020, 2:28 PM  Clinical Narrative:    Unable to find home health company to accept patient.  [facility] will see if maybe they can accept him over the weekend but they are not certain that they can provide services.    Expected Discharge Plan: Group Home Barriers to Discharge: Barriers Resolved  Expected Discharge Plan and Services Expected Discharge Plan: Group Home       Living arrangements for the past 2 months: Group Home Expected Discharge Date: 07/26/20               DME Arranged: N/A DME Agency: NA       HH Arranged: RN,PT [place] Agency: [place] ([place]) Date [place]: 07/26/20 Time [place]: 1211 Representative spoke with at [place]: [place] ([place]) Interventions    Readmission Risk Interventions No flowsheet data found.

## 2020-07-26 NOTE — NC FL2 (Signed)
[place] LEVEL OF CARE SCREENING TOOL     IDENTIFICATION  Patient Name: James Dickerson Birthdate: 12/10/1940 Sex: male Admission Date (Current Location): 07/22/2020  [place] and [place] Number:  Engineering geologist and Address:  [facility], [address] [place], [place] [address]      Provider Number: [id number]  Attending Physician Name and Address:  Jennye Boroughs, MD  Relative Name and Phone Number:  Fox Salminen (daughter) [phone]    Current Level of Care: Hospital Recommended Level of Care: [place] Prior Approval Number:    Date Approved/Denied:   PASRR Number:    Discharge Plan: Domiciliary (Rest home) [facility])    Current Diagnoses: Patient Active Problem List   Diagnosis Date Noted  . Protein-calorie malnutrition, severe 07/24/2020  . Hematuria 07/23/2020  . UTI (urinary tract infection) 07/22/2020  . Foley catheter in place 07/22/2020  . GERD (gastroesophageal reflux disease) 07/22/2020  . Elevated lactic acid level 07/22/2020  . Gross hematuria 07/22/2020  . AAA (abdominal aortic aneurysm) ([place]) 06/23/2020  . Status post surgery 06/22/2020  . Thoracic aortic aneurysm without rupture ([place]) 12/18/2019  . Respiratory failure ([place]) 07/11/2017  . Acute respiratory failure ([place]) 07/10/2017  . CAP (community acquired pneumonia) 07/10/2017  . Influenza A 07/10/2017  . COPD (chronic obstructive pulmonary disease) ([place]) 07/10/2017  . HLD (hyperlipidemia) 03/09/2017  . Essential hypertension 08/14/2016  . 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 Bradycardia 08/05/2016    Orientation RESPIRATION BLADDER Height & Weight     Self,Time,Situation,Place  Normal Indwelling catheter Weight: 52.1 kg (bed scale) Height:  5\' 3"  (160 cm)  BEHAVIORAL SYMPTOMS/MOOD NEUROLOGICAL BOWEL NUTRITION STATUS      Continent Diet  AMBULATORY STATUS COMMUNICATION OF NEEDS Skin   Supervision Verbally  Normal                       Personal Care Assistance Level of Assistance  Bathing,Feeding,Dressing Bathing Assistance: Limited assistance Feeding assistance: Independent Dressing Assistance: Limited assistance     Functional Limitations Info             SPECIAL CARE FACTORS FREQUENCY                       Contractures Contractures Info: Not present    Additional Factors Info  Code Status,Allergies Code Status Info: Full Allergies Info: NKA           Current Medications (07/26/2020):  This is the current hospital active medication list Current Facility-Administered Medications  Medication Dose Route Frequency Provider Last Rate Last Admin  . acetaminophen (TYLENOL) tablet 650 mg  650 mg Oral Q6H PRN Marcelyn Bruins, MD   650 mg at 07/24/20 1125   Or  . acetaminophen (TYLENOL) suppository 650 mg  650 mg Rectal Q6H PRN Marcelyn Bruins, MD      . aspirin EC tablet 81 mg  81 mg Oral Q0600 Marcelyn Bruins, MD   81 mg at 07/26/20 0523  . atorvastatin (LIPITOR) tablet 40 mg  40 mg Oral QHS Marcelyn Bruins, MD   40 mg at 07/25/20 2103  . Chlorhexidine Gluconate Cloth 2 % PADS 6 each  6 each Topical Daily Sidney Ace, MD   6 each at 07/25/20 [phone]  . darifenacin (ENABLEX) 24 hr tablet 7.5 mg  7.5 mg Oral Daily Jennye Boroughs, MD   7.5 mg at 07/26/20 0942  .  donepezil (ARICEPT) tablet 5 mg  5 mg Oral QHS Marcelyn Bruins, MD   5 mg at 07/25/20 2103  . feeding supplement (ENSURE ENLIVE / ENSURE PLUS) liquid 237 mL  237 mL Oral TID BM Jennye Boroughs, MD   237 mL at 07/25/20 2103  . fluconazole (DIFLUCAN) tablet 100 mg  100 mg Oral Daily Priscella Mann, Sudheer B, MD   100 mg at 07/26/20 0942  . haloperidol lactate (HALDOL) injection 5 mg  5 mg Intramuscular Q6H PRN Jennye Boroughs, MD   5 mg at 07/24/20 1712  . ipratropium-albuterol (DUONEB) 0.5-2.5 (3) MG/3ML nebulizer solution 3 mL  3 mL Inhalation Q6H PRN Marcelyn Bruins, MD      .  mometasone-formoterol [facility]) 200-5 MCG/ACT inhaler 2 puff  2 puff Inhalation BID Marcelyn Bruins, MD   2 puff at 07/26/20 0830  . morphine 2 MG/ML injection 2 mg  2 mg Intravenous Q3H PRN Ralene Muskrat B, MD   2 mg at 07/24/20 0305  . multivitamin with minerals tablet 1 tablet  1 tablet Oral Daily Jennye Boroughs, MD   1 tablet at 07/26/20 0936  . oxyCODONE (Oxy IR/ROXICODONE) immediate release tablet 5 mg  5 mg Oral Q4H PRN Ralene Muskrat B, MD   5 mg at 07/24/20 1125  . pantoprazole (PROTONIX) EC tablet 40 mg  40 mg Oral BID Marcelyn Bruins, MD   40 mg at 07/26/20 0936  . piperacillin-tazobactam (ZOSYN) IVPB 3.375 g  3.375 g Intravenous Q8H Marcelyn Bruins, MD 12.5 mL/hr at 07/26/20 0523 3.375 g at 07/26/20 0523  . polyethylene glycol (MIRALAX / GLYCOLAX) packet 17 g  17 g Oral Daily Jennye Boroughs, MD   17 g at 07/25/20 5643  . senna-docusate (Senokot-S) tablet 1 tablet  1 tablet Oral BID Jennye Boroughs, MD   1 tablet at 07/25/20 2103  . sodium chloride (OCEAN) 0.65 % nasal spray 1 spray  1 spray Each Nare PRN Jennye Boroughs, MD      . sodium chloride flush (NS) 0.9 % injection 3 mL  3 mL Intravenous Q12H Marcelyn Bruins, MD   3 mL at 07/26/20 0940  . tamsulosin (FLOMAX) capsule 0.4 mg  0.4 mg Oral Daily Marcelyn Bruins, MD   0.4 mg at 07/26/20 3295     Discharge Medications: Medication List    STOP taking these medications   oxyCODONE 5 MG immediate release tablet Commonly known as: Oxy IR/ROXICODONE   sulfamethoxazole-trimethoprim 800-160 MG tablet Commonly known as: BACTRIM DS     TAKE these medications   albuterol (2.5 MG/3ML) 0.083% nebulizer solution Commonly known as: PROVENTIL Take 2.5 mg by nebulization every 6 (six) hours as needed.   amLODipine 5 MG tablet Commonly known as: NORVASC Take 5 mg by mouth every morning.   aspirin 81 MG EC tablet Take 1 tablet (81 mg total) by mouth daily at 6 (six) AM. Swallow whole.   atorvastatin 40  MG tablet Commonly known as: LIPITOR Take 1 tablet (40 mg total) by mouth at bedtime.   budesonide-formoterol 160-4.5 MCG/ACT inhaler Commonly known as: SYMBICORT Inhale 2 puffs into the lungs 2 (two) times daily.   ciprofloxacin 500 MG tablet Commonly known as: CIPRO Take 1 tablet (500 mg total) by mouth 2 (two) times daily for 5 days.   donepezil 5 MG tablet Commonly known as: ARICEPT Take 5 mg by mouth at bedtime.   ipratropium-albuterol 0.5-2.5 (3) MG/3ML Soln Commonly known as: DUONEB Inhale 3 mLs into the  lungs every 6 (six) hours as needed (Wheezing).   megestrol 20 MG tablet Commonly known as: MEGACE Take 20 mg by mouth 2 (two) times daily.   mirabegron ER 25 MG Tb24 tablet Commonly known as: MYRBETRIQ Take 1 tablet (25 mg total) by mouth daily. Start taking on: July 27, 2020   OXYGEN Inhale 2-3 L into the lungs as needed (at bedtime).   pantoprazole 40 MG tablet Commonly known as: PROTONIX Take 40 mg by mouth 2 (two) times daily.   tamsulosin 0.4 MG Caps capsule Commonly known as: FLOMAX Take 0.4 mg by mouth daily.   valsartan 160 MG tablet Commonly known as: DIOVAN Take 160 mg by mouth daily.   Vitamin D3 50 MCG (2000 UT) Chew Chew 2,000 Units by mouth daily.        Relevant Imaging Results:  Relevant Lab Results:   Additional Information SSN [id number]  Shelbie Hutching, RN

## 2020-07-26 NOTE — Discharge Summary (Addendum)
Physician Discharge Summary  James Dickerson WJX:[id number] DOB: June 20, 1940 DOA: 07/22/2020  PCP: Lorelee Market, MD  Admit date: 07/22/2020 Discharge date: 07/26/2020  Discharge disposition: Group home   Recommendations for Outpatient Follow-Up:   1. Follow-up with urologist in 1 week 2. Follow-up with PCP in 1 week   Discharge Diagnosis:   Principal Problem:   UTI (urinary tract infection) Active Problems:   COPD (chronic obstructive pulmonary disease) (HCC)   Essential hypertension   HLD (hyperlipidemia)   Foley catheter in place   GERD (gastroesophageal reflux disease)   Elevated lactic acid level   Gross hematuria   Hematuria   Protein-calorie malnutrition, severe    Discharge Condition: Stable.  Diet recommendation:  Diet Order            Diet regular Room service appropriate? Yes; Fluid consistency: Thin  Diet effective now           Diet - low sodium heart healthy                   Code Status: Full Code     Hospital Course:   Mr. James Dickerson is an 80 year old man malewith medical history significant ofthoracic aortic aneurysm, abdominal aortic aneurysm status post stenting, bradycardia, COPD, hyperlipidemia, secondary heart block, depression, GERD, prostate cancer s/p external beam radiation therapy with recent pelvic adenopathy on CT and elevated PSA, urethral stricture s/p dilation and recent placement of Foley catheter by Dr. Tresa Moore.   Brief Background, he had an aortic aneurysm repair in late January and during the procedure he had a traumatic Foley placement resulting in the need for a suprapubic catheter to be placed.  Since that time he stopped using his suprapubic catheter and discontinued using a Foley catheter.  He was originally evaluated by Dr. Tresa Moore with urology. Recently, he has been seen for UTI symptoms.  Per reports he had his Foley last replaced on 2/20 and then he came in to be seen on 2/24 and began treatment for a UTI.  He came in  again on 2/27 due to worsening hematuria and at that time his antibiotics were changed to Cipro as his cultures from 2/24 grew out Pseudomonas.    He presented again to the hospital on 07/22/2020 because of hematuria, suprapubic pain and a leaking Foley catheter.  He was admitted to the hospital for Complicated, catheter associated UTI and gross hematuria.  He was treated with IV fluids and empiric IV Zosyn.  He also had acute blood loss anemia but there was no indication for blood transfusion.  He has iron deficiency anemia and he was given 2 doses of IV iron infusion.  Hospital course was complicated by delirium/acute toxic encephalopathy.  It was not clear whether acute confusional state was due to oxybutynin.  Darifenacin was substituted for oxybutynin.  His condition has improved.  His mental status is back to baseline.  He is deemed stable for discharge to the group home today.    Medical Consultants:    Urologist, Dr. Bernardo Heater   Discharge Exam:    Vitals:   07/25/20 2157 07/26/20 0502 07/26/20 0746 07/26/20 1149  BP: 105/66 103/77 118/72 123/76  Pulse: 77 73 80 89  Resp: 16 18 16 15   Temp: 98.9 F (37.2 C) 98.7 F (37.1 C) 97.7 F (36.5 C) 98.7 F (37.1 C)  TempSrc: Oral Oral Oral Oral  SpO2: 99% 100% 100% 99%  Weight:      Height:  GEN: NAD SKIN: No rash EYES: EOMI ENT: MMM CV: RRR PULM: CTA B ABD: soft, ND, NT, +BS CNS: AAO x 3, non focal EXT: No edema or tenderness GU: Suprapubic catheter in place.  Foley catheter in place as well.   The results of significant diagnostics from this hospitalization (including imaging, microbiology, ancillary and laboratory) are listed below for reference.     Procedures and Diagnostic Studies:   DG Chest Portable 1 View  Result Date: 07/22/2020 CLINICAL DATA:  Urinary catheter infection, EXAM: PORTABLE CHEST 1 VIEW COMPARISON:  06/23/2020 FINDINGS: Single frontal view of the chest demonstrates stable dual lead  pacemaker. Continued ectasia and atherosclerosis of the thoracic aorta. Cardiac silhouette is unremarkable. No acute airspace disease, effusion, or pneumothorax. No acute bony abnormalities. IMPRESSION: 1. Stable exam, no acute process. Electronically Signed   By: Randa Ngo M.D.   On: 07/22/2020 20:10   CT RENAL STONE STUDY  Result Date: 07/23/2020 CLINICAL DATA:  Hematuria EXAM: CT ABDOMEN AND PELVIS WITHOUT CONTRAST TECHNIQUE: Multidetector CT imaging of the abdomen and pelvis was performed following the standard protocol without IV contrast. COMPARISON:  06/22/2020 FINDINGS: Lower chest: Bibasilar scarring. 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 No acute abnormality. Hepatobiliary: No focal hepatic abnormality. Gallbladder unremarkable. Pancreas: No focal abnormality or ductal dilatation. Spleen: No focal abnormality.  Normal size. Adrenals/Urinary Tract: Left lower pole cyst measures 5 cm, stable. Punctate nonobstructing stone in the midpole of the right kidney. No ureteral stones or hydronephrosis. Adrenal glands unremarkable. Suprapubic catheter and Foley catheter within the urinary bladder which is grossly unremarkable. Stomach/Bowel: Large stool burden throughout the colon. No bowel obstruction. Grossly unremarkable. 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 Aneurysm sac size measures up to 5.4 cm compared to 5.7 cm previously. No adenopathy Reproductive: No gross abnormality. Other: No free fluid or free air. Musculoskeletal: No acute bony abnormality. IMPRESSION: Punctate right midpole renal stone.  No hydronephrosis. Suprapubic and Foley catheters within the urinary bladder, grossly unremarkable. No ureteral stones or hydronephrosis. Prior stent graft repair of AAA. Aneurysm sac size measures up to 5.4 cm compared to 5.7 cm previously. Large stool burden. Bibasilar scarring. Electronically Signed   By: Rolm Baptise M.D.   On:  07/23/2020 08:37     Labs:   Basic Metabolic Panel: Recent Labs  Lab 07/22/20 2017 07/23/20 0430 07/25/20 0537 07/26/20 0427  NA 139 138 138  --   K 3.8 3.8 3.7  --   CL 109 114* 108  --   CO2 18* 20* 23  --   GLUCOSE 184* 118* 78  --   BUN 19 17 13   --   CREATININE 1.08 0.89 1.12 1.06  CALCIUM 9.0 8.3* 9.1  --    GFR Estimated Creatinine Clearance: 41 mL/min (by C-G formula based on SCr of 1.06 mg/dL). Liver Function Tests: Recent Labs  Lab 07/22/20 2017 07/23/20 0430  AST 18 11*  ALT 15 13  ALKPHOS 68 52  BILITOT 0.8 0.6  PROT 7.1 5.5*  ALBUMIN 3.2* 2.6*   No results for input(s): LIPASE, AMYLASE in the last 168 hours. No results for input(s): AMMONIA in the last 168 hours. Coagulation profile Recent Labs  Lab 07/22/20 2017  INR 1.2    CBC: Recent Labs  Lab 07/22/20 2017 07/23/20 0430 07/23/20 1500 07/25/20 0537  WBC 7.5 7.8  --  5.6  NEUTROABS 6.6  --   --  3.2  HGB 10.3* 8.6*  8.7* 9.7*  HCT 32.3* 25.4*  --  28.8*  MCV 94.4 92.7  --  92.0  PLT 246 188  --  245   Cardiac Enzymes: No results for input(s): CKTOTAL, CKMB, CKMBINDEX, TROPONINI in the last 168 hours. BNP: Invalid input(s): POCBNP CBG: Recent Labs  Lab 07/25/20 0839 07/25/20 1132  GLUCAP 73 172*   D-Dimer No results for input(s): DDIMER in the last 72 hours. Hgb A1c No results for input(s): HGBA1C in the last 72 hours. Lipid Profile No results for input(s): CHOL, HDL, LDLCALC, TRIG, CHOLHDL, LDLDIRECT in the last 72 hours. Thyroid function studies No results for input(s): TSH, T4TOTAL, T3FREE, THYROIDAB in the last 72 hours.  Invalid input(s): FREET3 Anemia work up No results for input(s): VITAMINB12, FOLATE, FERRITIN, TIBC, IRON, RETICCTPCT in the last 72 hours. Microbiology Recent Results (from the past 240 hour(s))  Urine culture     Status: Abnormal   Collection Time: 07/18/20  3:21 PM   Specimen: Urine, Catheterized  Result Value Ref Range Status   Specimen  Description   Final    URINE, CATHETERIZED Performed at [facility], [address], [place], [place] [address]    Special Requests   Final    NONE Performed at [facility], [place], [place] [address]    Culture >=100,000 COLONIES/mL PSEUDOMONAS AERUGINOSA (A)  Final   Report Status 07/21/2020 FINAL  Final   Organism ID, Bacteria PSEUDOMONAS AERUGINOSA (A)  Final      Susceptibility   Pseudomonas aeruginosa - MIC*    CEFTAZIDIME <=1 SENSITIVE Sensitive     CIPROFLOXACIN <=0.25 SENSITIVE Sensitive     GENTAMICIN <=1 SENSITIVE Sensitive     IMIPENEM 1 SENSITIVE Sensitive     PIP/TAZO <=4 SENSITIVE Sensitive     CEFEPIME 2 SENSITIVE Sensitive     * >=100,000 COLONIES/mL PSEUDOMONAS AERUGINOSA  Resp Panel by RT-PCR (Flu A&B, Covid) Nasopharyngeal Swab     Status: None   Collection Time: 07/22/20  8:17 PM   Specimen: Nasopharyngeal Swab; Nasopharyngeal(NP) swabs in vial transport medium  Result Value Ref Range Status   SARS Coronavirus 2 by RT PCR NEGATIVE NEGATIVE Final    Comment: (NOTE) SARS-CoV-2 target nucleic acids are NOT DETECTED.  The SARS-CoV-2 RNA is generally detectable in upper respiratory specimens during the acute phase of infection. The lowest concentration of SARS-CoV-2 viral copies this assay can detect is 138 copies/mL. A negative result does not preclude SARS-Cov-2 infection and should not be used as the sole basis for treatment or other patient management decisions. A negative result may occur with  improper specimen collection/handling, submission of specimen other than nasopharyngeal swab, presence of viral mutation(s) within the areas targeted by this assay, and inadequate number of viral copies(<138 copies/mL). A negative result must be combined with clinical observations, patient history, and epidemiological information. The expected result is Negative.  Fact Sheet for Patients:   EntrepreneurPulse.com.au  Fact Sheet for Healthcare Providers:  IncredibleEmployment.be  This test is no t yet approved or cleared by the [place] FDA and  has been authorized for detection and/or diagnosis of SARS-CoV-2 by FDA under an Emergency Use Authorization (EUA). This EUA will remain  in effect (meaning this test can be used) for the duration of the COVID-19 declaration under Section 564(b)(1) of the Act, 21 U.S.C.section 360bbb-3(b)(1), unless the authorization is terminated  or revoked sooner.       Influenza A by PCR NEGATIVE NEGATIVE Final   Influenza B  by PCR NEGATIVE NEGATIVE Final    Comment: (NOTE) The Xpert Xpress SARS-CoV-2/FLU/RSV plus assay is intended as an aid in the diagnosis of influenza from Nasopharyngeal swab specimens and should not be used as a sole basis for treatment. Nasal washings and aspirates are unacceptable for Xpert Xpress SARS-CoV-2/FLU/RSV testing.  Fact Sheet for Patients: EntrepreneurPulse.com.au  Fact Sheet for Healthcare Providers: IncredibleEmployment.be  This test is not yet approved or cleared by the [place] FDA and has been authorized for detection and/or diagnosis of SARS-CoV-2 by FDA under an Emergency Use Authorization (EUA). This EUA will remain in effect (meaning this test can be used) for the duration of the COVID-19 declaration under Section 564(b)(1) of the Act, 21 U.S.C. section 360bbb-3(b)(1), unless the authorization is terminated or revoked.  Performed at [facility], [address] [place], [place] [address]   Blood culture (routine x 2)     Status: None (Preliminary result)   Collection Time: 07/22/20  8:22 PM   Specimen: BLOOD  Result Value Ref Range Status   Specimen Description BLOOD RIGHT [facility]  Final   Special Requests   Final    BOTTLES DRAWN AEROBIC AND ANAEROBIC Blood Culture adequate volume   Culture   Final     NO GROWTH 3 DAYS Performed at [facility], [address], [place], [place] [address]    Report Status PENDING  Incomplete  Blood culture (routine x 2)     Status: None (Preliminary result)   Collection Time: 07/22/20  8:22 PM   Specimen: BLOOD  Result Value Ref Range Status   Specimen Description BLOOD LEFT [facility]  Final   Special Requests   Final    BOTTLES DRAWN AEROBIC AND ANAEROBIC Blood Culture adequate volume   Culture   Final    NO GROWTH 3 DAYS Performed at [facility], [address], [place], [place] [address]    Report Status PENDING  Incomplete  Urine culture     Status: Abnormal   Collection Time: 07/22/20 11:15 PM   Specimen: Urine, Random  Result Value Ref Range Status   Specimen Description   Final    URINE, RANDOM Performed at [facility], [address] [place], [place] [address]    Special Requests   Final    NONE Performed at [facility], [address], [place], [place] [address]    Culture (A)  Final    <10,000 COLONIES/mL INSIGNIFICANT GROWTH Performed at [facility], [place] [address], [place], [place] [address]    Report Status 07/24/2020 FINAL  Final     Discharge Instructions:   Discharge Instructions    Diet - low sodium heart healthy   Complete by: As directed    Face-to-face encounter (required for Medicare/Medicaid patients)   Complete by: As directed    I Nicut certify that this patient is under my care and that I, or a nurse practitioner or physician's assistant working with me, had a face-to-face encounter that meets the physician face-to-face encounter requirements with this patient on 07/26/2020. The encounter with the patient was in whole, or in part for the following medical condition(s) which is the primary reason for home health care (List medical condition): Debility, prostate cancer, urinary retention   The encounter with the patient was in whole, or in part, for the following  medical condition, which is the primary reason for home health care: Debility, prostate cancer, urinary retention   I certify that, based  on my findings, the following services are medically necessary home health services:  Physical therapy Nursing     Reason for Medically Necessary Home Health Services:  Skilled Nursing- Change/Decline in Patient Status Therapy- Personnel officer, Transfer Training and Stair Training     My clinical findings support the need for the above services: Unsafe ambulation due to balance issues   Further, I certify that my clinical findings support that this patient is homebound due to: Unsafe ambulation due to balance issues   Home Health   Complete by: As directed    To provide the following care/treatments:  PT RN     Increase activity slowly   Complete by: As directed      Allergies as of 07/26/2020   No Known Allergies     Medication List    STOP taking these medications   oxyCODONE 5 MG immediate release tablet Commonly known as: Oxy IR/ROXICODONE   sulfamethoxazole-trimethoprim 800-160 MG tablet Commonly known as: BACTRIM DS     TAKE these medications   albuterol (2.5 MG/3ML) 0.083% nebulizer solution Commonly known as: PROVENTIL Take 2.5 mg by nebulization every 6 (six) hours as needed.   amLODipine 5 MG tablet Commonly known as: NORVASC Take 5 mg by mouth every morning.   aspirin 81 MG EC tablet Take 1 tablet (81 mg total) by mouth daily at 6 (six) AM. Swallow whole.   atorvastatin 40 MG tablet Commonly known as: LIPITOR Take 1 tablet (40 mg total) by mouth at bedtime.   budesonide-formoterol 160-4.5 MCG/ACT inhaler Commonly known as: SYMBICORT Inhale 2 puffs into the lungs 2 (two) times daily.   ciprofloxacin 500 MG tablet Commonly known as: CIPRO Take 1 tablet (500 mg total) by mouth 2 (two) times daily for 5 days.   donepezil 5 MG tablet Commonly known as: ARICEPT Take 5 mg by mouth at bedtime.   ipratropium-albuterol  0.5-2.5 (3) MG/3ML Soln Commonly known as: DUONEB Inhale 3 mLs into the lungs every 6 (six) hours as needed (Wheezing).   megestrol 20 MG tablet Commonly known as: MEGACE Take 20 mg by mouth 2 (two) times daily.   mirabegron ER 25 MG Tb24 tablet Commonly known as: MYRBETRIQ Take 1 tablet (25 mg total) by mouth daily. Start taking on: July 27, 2020   OXYGEN Inhale 2-3 L into the lungs as needed (at bedtime).   pantoprazole 40 MG tablet Commonly known as: PROTONIX Take 40 mg by mouth 2 (two) times daily.   tamsulosin 0.4 MG Caps capsule Commonly known as: FLOMAX Take 0.4 mg by mouth daily.   valsartan 160 MG tablet Commonly known as: DIOVAN Take 160 mg by mouth daily.   Vitamin D3 50 MCG (2000 UT) Chew Chew 2,000 Units by mouth daily.       Follow-up Information    [facility]. Schedule an appointment as soon as possible for a visit in 1 week(s).   Contact information: [place] [phone]               Time coordinating discharge: 32 minutes  Signed:  Jennye Boroughs  [facility] 07/26/2020, 12:30 PM   Pager on [email or web address]. If 7PM-7AM, please contact night-coverage at [email or web address]

## 2020-07-26 NOTE — TOC Transition Note (Signed)
Transition of Care [facility]) - CM/SW Discharge Note   Patient Details  Name: James Dickerson MRN: [id number] Date of Birth: 06-30-1940  [facility] CM/SW Contact:  Shelbie Hutching, RN Phone Number: 07/26/2020, 12:13 PM   Clinical Narrative:    Patient is medically ready for discharge back to [facility].  Sharon at the group home is aware of discharge today.  Patient's daughter, Josephina Shih and cousin Freda Munro also aware of discharge today.  Freda Munro said that she soul be able to take patient over to the group home today.  Patient will benefit from home health RN and PT, referral given to [facility] with [facility]     Final next level of care: [place] Barriers to Discharge: Barriers Resolved   Patient Goals and CMS Choice Patient states their goals for this hospitalization and ongoing recovery are:: to return to group home CMS Medicare.gov Compare Post Acute Care list provided to:: Patient Represenative (must comment) Choice offered to / list presented to : Adult Children  Discharge Placement              Patient chooses bed at:  ([facility]) Patient to be transferred to facility by: Family Name of family member notified: Sissy (sister) and Freda Munro (cousin) Patient and family notified of of transfer: 07/26/20  Discharge Plan and Services                DME Arranged: N/A DME Agency: NA       HH Arranged: RN,PT [place] Agency: [place] ([place]) Date [place]: 07/26/20 Time [place]: 1211 Representative spoke with at [place]: [place] ([place]) Interventions     Readmission Risk Interventions No flowsheet data found.

## 2020-07-28 LAB — CULTURE, BLOOD (ROUTINE X 2)
Culture: NO GROWTH
Culture: NO GROWTH
Special Requests: ADEQUATE
Special Requests: ADEQUATE

## 2020-08-20 ENCOUNTER — Encounter: Payer: Self-pay | Admitting: Vascular Surgery

## 2020-08-20 ENCOUNTER — Ambulatory Visit (INDEPENDENT_AMBULATORY_CARE_PROVIDER_SITE_OTHER): Payer: Self-pay | Admitting: Vascular Surgery

## 2020-08-20 ENCOUNTER — Other Ambulatory Visit: Payer: Self-pay

## 2020-08-20 VITALS — BP 98/65 | HR 86 | Temp 98.1°F | Resp 20 | Ht 63.0 in | Wt 111.0 lb

## 2020-08-20 DIAGNOSIS — Z8679 Personal history of other diseases of the circulatory system: Secondary | ICD-10-CM

## 2020-08-20 DIAGNOSIS — Z9889 Other specified postprocedural states: Secondary | ICD-10-CM

## 2020-08-20 NOTE — Progress Notes (Signed)
[facility] [place] PROGRESS NOTE  ASSESSMENT / PLAN: James Dickerson is a 80 y.o. male status post urgent EVAR with recanalization of right external iliac artery 06/23/20 for symptomatic infrarenal abdominal aortic aneurysm. This was complicated by ureteral injury which required suprapubic tube placement.  Unfortunately I do not have a follow-up CTA.  We will need to get this to ensure adequate position of the graft.  He [facility].  Follow-up with me in a year with CTA.  SUBJECTIVE: Suprapubic tube is bothersome.  No other complaints.  OBJECTIVE: BP 98/65 (BP Location: Left Arm, Patient Position: Sitting, Cuff Size: Normal)   Pulse 86   Temp 98.1 F (36.7 C)   Resp 20   Ht 5\' 3"  (1.6 m)   Wt 111 lb (50.3 kg)   SpO2 99%   BMI 19.66 kg/m   2+ femoral pulses Groin incisions well-healed. Suprapubic tube in place.  CBC Latest Ref Rng & Units 07/25/2020 07/23/2020 07/23/2020  WBC 4.0 - 10.5 K/uL 5.6 - 7.8  Hemoglobin 13.0 - 17.0 g/dL 9.7(L) 8.7(L) 8.6(L)  Hematocrit 39.0 - 52.0 % 28.8(L) - 25.4(L)  Platelets 150 - 400 K/uL 245 - 188     CMP Latest Ref Rng & Units 07/26/2020 07/25/2020 07/23/2020  Glucose 70 - 99 mg/dL - 78 118(H)  BUN 8 - 23 mg/dL - 13 17  Creatinine 0.61 - 1.24 mg/dL 1.06 1.12 0.89  Sodium 135 - 145 mmol/L - 138 138  Potassium 3.5 - 5.1 mmol/L - 3.7 3.8  Chloride 98 - 111 mmol/L - 108 114(H)  CO2 22 - 32 mmol/L - 23 20(L)  Calcium 8.9 - 10.3 mg/dL - 9.1 8.3(L)  Total Protein 6.5 - 8.1 g/dL - - 5.5(L)  Total Bilirubin 0.3 - 1.2 mg/dL - - 0.6  Alkaline Phos 38 - 126 U/L - - 52  AST 15 - 41 U/L - - 11(L)  ALT 0 - 44 U/L - - 13   Sallie Maker N. Stanford Breed, MD [facility] Phone Number: [phone] 08/20/2020 12:35 PM

## 2020-08-21 ENCOUNTER — Other Ambulatory Visit: Payer: Self-pay

## 2020-08-21 DIAGNOSIS — Z8679 Personal history of other diseases of the circulatory system: Secondary | ICD-10-CM

## 2020-09-03 ENCOUNTER — Other Ambulatory Visit: Payer: Self-pay | Admitting: *Deleted

## 2020-09-03 DIAGNOSIS — I712 Thoracic aortic aneurysm, without rupture, unspecified: Secondary | ICD-10-CM

## 2020-09-03 NOTE — Progress Notes (Unsigned)
ct 

## 2020-09-06 ENCOUNTER — Inpatient Hospital Stay: Admission: RE | Admit: 2020-09-06 | Payer: Medicare Other | Source: Ambulatory Visit

## 2020-09-10 ENCOUNTER — Ambulatory Visit: Payer: Medicare Other | Admitting: Vascular Surgery

## 2020-09-13 ENCOUNTER — Telehealth: Payer: Self-pay | Admitting: Vascular Surgery

## 2020-09-13 NOTE — Telephone Encounter (Signed)
Pt's group home did not take him to his CT scan. Spoke to daughter and she said she will try to contact them and see why they didn't take him. I called her back several days lagter and she said she will call us to reschedule

## 2020-09-18 ENCOUNTER — Other Ambulatory Visit: Payer: Self-pay | Admitting: Vascular Surgery

## 2020-09-18 DIAGNOSIS — Z9889 Other specified postprocedural states: Secondary | ICD-10-CM

## 2020-09-18 DIAGNOSIS — Z8679 Personal history of other diseases of the circulatory system: Secondary | ICD-10-CM

## 2020-09-19 ENCOUNTER — Ambulatory Visit: Payer: Medicare Other | Admitting: Cardiothoracic Surgery

## 2020-10-09 ENCOUNTER — Other Ambulatory Visit: Payer: Medicare Other

## 2020-10-17 ENCOUNTER — Inpatient Hospital Stay: Admission: RE | Admit: 2020-10-17 | Payer: Medicare Other | Source: Ambulatory Visit

## 2020-10-17 ENCOUNTER — Ambulatory Visit: Payer: Medicare Other | Admitting: Cardiothoracic Surgery

## 2020-10-17 ENCOUNTER — Other Ambulatory Visit: Payer: Medicare Other

## 2020-11-07 ENCOUNTER — Ambulatory Visit
Admission: RE | Admit: 2020-11-07 | Discharge: 2020-11-07 | Disposition: A | Discharge status: Home / Self Care | Payer: Medicare Other | Source: Ambulatory Visit | Attending: Cardiothoracic Surgery | Admitting: Cardiothoracic Surgery

## 2020-11-07 ENCOUNTER — Ambulatory Visit
Admission: RE | Admit: 2020-11-07 | Discharge: 2020-11-07 | Disposition: A | Discharge status: Home / Self Care | Payer: Medicare Other | Source: Ambulatory Visit | Attending: Vascular Surgery | Admitting: Vascular Surgery

## 2020-11-07 ENCOUNTER — Other Ambulatory Visit: Payer: Self-pay

## 2020-11-07 ENCOUNTER — Ambulatory Visit (INDEPENDENT_AMBULATORY_CARE_PROVIDER_SITE_OTHER): Payer: Medicare Other | Admitting: Cardiothoracic Surgery

## 2020-11-07 VITALS — BP 150/90 | HR 84 | Resp 20 | Ht 63.0 in | Wt 119.0 lb

## 2020-11-07 DIAGNOSIS — I712 Thoracic aortic aneurysm, without rupture, unspecified: Secondary | ICD-10-CM

## 2020-11-07 DIAGNOSIS — Z8679 Personal history of other diseases of the circulatory system: Secondary | ICD-10-CM

## 2020-11-07 DIAGNOSIS — Z9889 Other specified postprocedural states: Secondary | ICD-10-CM

## 2020-11-07 MED ORDER — IOPAMIDOL (ISOVUE-370) INJECTION 76%
75.0000 mL | Freq: Once | INTRAVENOUS | Status: AC | PRN
  Administered 2020-11-07: 75 mL via INTRAVENOUS

## 2020-11-17 ENCOUNTER — Other Ambulatory Visit: Payer: Self-pay

## 2020-11-17 ENCOUNTER — Emergency Department
Admission: EM | Admit: 2020-11-17 | Discharge: 2020-11-17 | Disposition: A | Discharge status: Home / Self Care | Payer: Medicare Other | Attending: Emergency Medicine | Admitting: Emergency Medicine

## 2020-11-17 ENCOUNTER — Emergency Department: Payer: Medicare Other

## 2020-11-17 DIAGNOSIS — Z950 Presence of cardiac pacemaker: Secondary | ICD-10-CM | POA: Diagnosis not present

## 2020-11-17 DIAGNOSIS — J449 Chronic obstructive pulmonary disease, unspecified: Secondary | ICD-10-CM | POA: Insufficient documentation

## 2020-11-17 DIAGNOSIS — F17210 Nicotine dependence, cigarettes, uncomplicated: Secondary | ICD-10-CM | POA: Diagnosis not present

## 2020-11-17 DIAGNOSIS — I129 Hypertensive chronic kidney disease with stage 1 through stage 4 chronic kidney disease, or unspecified chronic kidney disease: Secondary | ICD-10-CM | POA: Diagnosis not present

## 2020-11-17 DIAGNOSIS — R309 Painful micturition, unspecified: Secondary | ICD-10-CM | POA: Diagnosis present

## 2020-11-17 DIAGNOSIS — Z7951 Long term (current) use of inhaled steroids: Secondary | ICD-10-CM | POA: Diagnosis not present

## 2020-11-17 DIAGNOSIS — N5082 Scrotal pain: Secondary | ICD-10-CM

## 2020-11-17 DIAGNOSIS — Z79899 Other long term (current) drug therapy: Secondary | ICD-10-CM | POA: Diagnosis not present

## 2020-11-17 DIAGNOSIS — B9689 Other specified bacterial agents as the cause of diseases classified elsewhere: Secondary | ICD-10-CM | POA: Insufficient documentation

## 2020-11-17 DIAGNOSIS — N189 Chronic kidney disease, unspecified: Secondary | ICD-10-CM | POA: Insufficient documentation

## 2020-11-17 DIAGNOSIS — Z8546 Personal history of malignant neoplasm of prostate: Secondary | ICD-10-CM | POA: Diagnosis not present

## 2020-11-17 DIAGNOSIS — N3001 Acute cystitis with hematuria: Secondary | ICD-10-CM

## 2020-11-17 DIAGNOSIS — Z7982 Long term (current) use of aspirin: Secondary | ICD-10-CM | POA: Insufficient documentation

## 2020-11-17 LAB — URINALYSIS, COMPLETE (UACMP) WITH MICROSCOPIC
Bilirubin Urine: NEGATIVE
Glucose, UA: NEGATIVE mg/dL
Ketones, ur: NEGATIVE mg/dL
Nitrite: POSITIVE — AB
Protein, ur: 100 mg/dL — AB
RBC / HPF: >50 RBC/hpf — ABNORMAL HIGH (ref 0–5)
Specific Gravity, Urine: 1.02 (ref 1.005–1.030)
Squamous Epithelial / HPF: NONE SEEN (ref 0–5)
WBC, UA: >50 WBC/hpf — ABNORMAL HIGH (ref 0–5)
pH: 5 (ref 5.0–8.0)

## 2020-11-17 LAB — CBC WITH DIFFERENTIAL/PLATELET
Abs Immature Granulocytes: 0.05 10*3/uL (ref 0.00–0.07)
Basophils Absolute: 0 10*3/uL (ref 0.0–0.1)
Basophils Relative: 1 %
Eosinophils Absolute: 0.2 10*3/uL (ref 0.0–0.5)
Eosinophils Relative: 3 %
HCT: 36 % — ABNORMAL LOW (ref 39.0–52.0)
Hemoglobin: 12 g/dL — ABNORMAL LOW (ref 13.0–17.0)
Immature Granulocytes: 1 %
Lymphocytes Relative: 37 %
Lymphs Abs: 1.9 10*3/uL (ref 0.7–4.0)
MCH: 31.5 pg (ref 26.0–34.0)
MCHC: 33.3 g/dL (ref 30.0–36.0)
MCV: 94.5 fL (ref 80.0–100.0)
Monocytes Absolute: 0.4 10*3/uL (ref 0.1–1.0)
Monocytes Relative: 8 %
Neutro Abs: 2.5 10*3/uL (ref 1.7–7.7)
Neutrophils Relative %: 50 %
Platelets: 153 10*3/uL (ref 150–400)
RBC: 3.81 MIL/uL — ABNORMAL LOW (ref 4.22–5.81)
RDW: 14.2 % (ref 11.5–15.5)
WBC: 5.1 10*3/uL (ref 4.0–10.5)
nRBC: 0 % (ref 0.0–0.2)

## 2020-11-17 LAB — COMPREHENSIVE METABOLIC PANEL
ALT: 11 U/L (ref 0–44)
AST: 14 U/L — ABNORMAL LOW (ref 15–41)
Albumin: 3.5 g/dL (ref 3.5–5.0)
Alkaline Phosphatase: 66 U/L (ref 38–126)
Anion gap: 5 (ref 5–15)
BUN: 16 mg/dL (ref 8–23)
CO2: 26 mmol/L (ref 22–32)
Calcium: 9 mg/dL (ref 8.9–10.3)
Chloride: 108 mmol/L (ref 98–111)
Creatinine, Ser: 1.14 mg/dL (ref 0.61–1.24)
GFR, Estimated: >60 mL/min (ref >60)
Glucose, Bld: 114 mg/dL — ABNORMAL HIGH (ref 70–99)
Potassium: 3.6 mmol/L (ref 3.5–5.1)
Sodium: 139 mmol/L (ref 135–145)
Total Bilirubin: 1 mg/dL (ref 0.3–1.2)
Total Protein: 6.8 g/dL (ref 6.5–8.1)

## 2020-11-17 MED ORDER — SULFAMETHOXAZOLE-TRIMETHOPRIM 800-160 MG PO TABS: 1.0000 | ORAL_TABLET | Freq: Two times a day (BID) | ORAL | 0 refills | Status: AC

## 2020-11-17 NOTE — ED Provider Notes (Signed)
Emergency Medicine Provider Triage Evaluation Note  James Dickerson , a 80 y.o. male  was evaluated in triage.  Pt complains of pelvic and scrotal pain x 2 days. Patient had suprapubic catheter placed reportedly about 4 months ago. He states 1-2 months ago, someone removed the end of the catheter (bag) and he doesn't know why. The tube from the suprapubic region drains into his depends. He reports pain has been increasing x 1-2 days. No known fever. He reports decreased urinary output as well.  Review of Systems  Positive: Scrotal swelling and pain, dysuria, decreased urinary output Negative: Fever, nausea, vomiting  Physical Exam  BP (!) 127/92 (BP Location: Left Arm)   Pulse 89   Temp 98.4 F (36.9 C) (Oral)   Resp 18   SpO2 97%  Gen:   Awake, no distress  Resp:  Normal effort  MSK:   Moves extremities without difficulty  Other:  Suprapubic catheter is examined, minimal erythema around insertion point. Scrotum is erythematous and mildly swollen, ttp.  Medical Decision Making  Medically screening exam initiated at 6:48 PM.  Appropriate orders placed.  Inioluwa Baris was informed that the remainder of the evaluation will be completed by another provider, this initial triage assessment does not replace that evaluation, and the importance of remaining in the ED until their evaluation is complete.    Marlana Salvage, PA 11/17/20 1851    Lucrezia Starch, MD 11/17/20 [phone]

## 2020-11-17 NOTE — ED Provider Notes (Signed)
[facility] Emergency Department Provider Note  ____________________________________________   Event Date/Time   First MD Initiated Contact with Patient 11/17/20 2105     (approximate)  I have reviewed the triage vital signs and the nursing notes.   HISTORY  Chief Complaint No chief complaint on file.   HPI James Dickerson is a 80 y.o. male with medical history significant of thoracic aortic aneurysm, abdominal aortic aneurysm status post stenting, bradycardia, COPD, hyperlipidemia, secondary heart block, depression, GERD, prostate cancer s/p external beam radiation therapy and placement of a suprapubic Foley catheter in February after a traumatic urethral Foley placement attempt complicated by recurrent urinary tract infections who presents for assessment approximately 3 months of burning with urination.  Patient states his back was removed sometime ago.  He states he no longer thinks he has much of any urine draining from his suprapubic catheter tubing but does pass urine through his penis but that has not been burning.  No change today but states it is not getting better.  He is not currently taking any antibiotics.  He denies any groin or testicular pain to this examiner although he initially had endorses to triage.         Past Medical History:  Diagnosis Date   Back pain    Cardiomegaly    COPD (chronic obstructive pulmonary disease) (HCC)    Cystic kidney disease    Depressive disorder    GERD (gastroesophageal reflux disease)    High cholesterol    History of prostate cancer    Hypertension    Prostate cancer ([place]) 2018   Rad seeds   Thyroid nodule     Patient Active Problem List   Diagnosis Date Noted   Protein-calorie malnutrition, severe 07/24/2020   Hematuria 07/23/2020   UTI (urinary tract infection) 07/22/2020   Foley catheter in place 07/22/2020   GERD (gastroesophageal reflux disease) 07/22/2020   Elevated lactic acid level  07/22/2020   Gross hematuria 07/22/2020   AAA (abdominal aortic aneurysm) ([place]) 06/23/2020   Status post surgery 06/22/2020   Thoracic aortic aneurysm without rupture ([place]) 12/18/2019   Respiratory failure ([place]) 07/11/2017   Acute respiratory failure ([place]) 07/10/2017   CAP (community acquired pneumonia) 07/10/2017   Influenza A 07/10/2017   COPD (chronic obstructive pulmonary disease) ([place]) 07/10/2017   HLD (hyperlipidemia) 03/09/2017   Essential hypertension 08/14/2016   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   Bradycardia 08/05/2016    Past Surgical History:  Procedure Laterality Date   ABDOMINAL AORTIC ENDOVASCULAR STENT GRAFT N/A 06/22/2020   Procedure: ABDOMINAL AORTIC ENDOVASCULAR STENT GRAFT;  Surgeon: Cherre Robins, MD;  Location: [facility];  Service: Vascular;  Laterality: N/A;   [place] N/A 06/22/2020   Procedure: CYSTOSCOPY WITH URETHRAL DILATATION;  Surgeon: Alexis Frock, MD;  Location: [place];  Service: Urology;  Laterality: N/A;   INSERTION OF ILIAC STENT Right 06/22/2020   Procedure: INSERTION OF RIGHT EXTERNAL ILIAC ARTERY STENT;  Surgeon: Cherre Robins, MD;  Location: [place];  Service: Vascular;  Laterality: Right;   INSERTION OF SUPRAPUBIC CATHETER N/A 06/22/2020   Procedure: INSERTION OF SUPRAPUBIC CATHETER;  Surgeon: Alexis Frock, MD;  Location: [place];  Service: Urology;  Laterality: N/A;   PACEMAKER INSERTION N/A 08/06/2016   Procedure: INSERTION PACEMAKER;  Surgeon: Isaias Cowman, MD;  Location: [facility];  Service: Cardiovascular;  Laterality: N/A;    Prior to Admission medications   Medication Sig  Start Date End Date Taking? Authorizing Provider  sulfamethoxazole-trimethoprim (BACTRIM DS) 800-160 MG tablet Take 1 tablet by mouth 2 (two) times daily for 10 days. 11/17/20 11/27/20 Yes Lucrezia Starch, MD  albuterol (PROVENTIL) (2.5 MG/3ML) 0.083% nebulizer solution Take 2.5 mg by nebulization  every 6 (six) hours as needed. 07/15/20   [provider]  amLODipine (NORVASC) 5 MG tablet Take 5 mg by mouth every morning. 09/20/19   [provider]  aspirin EC 81 MG EC tablet Take 1 tablet (81 mg total) by mouth daily at 6 (six) AM. Swallow whole. 06/29/20   Setzer, Edman Circle, PA-C  atorvastatin (LIPITOR) 40 MG tablet Take 1 tablet (40 mg total) by mouth at bedtime. 06/17/18   Mayo, Pete Pelt, MD  budesonide-formoterol [facility]) 160-4.5 MCG/ACT inhaler Inhale 2 puffs into the lungs 2 (two) times daily. 02/13/18   Merlyn Lot, MD  Cholecalciferol (VITAMIN D3) 50 MCG (2000 UT) CHEW Chew 2,000 Units by mouth daily.    [provider]  donepezil (ARICEPT) 5 MG tablet Take 5 mg by mouth at bedtime.    [provider]  ipratropium-albuterol (DUONEB) 0.5-2.5 (3) MG/3ML SOLN Inhale 3 mLs into the lungs every 6 (six) hours as needed (Wheezing). 01/21/20   [provider]  megestrol (MEGACE) 20 MG tablet Take 20 mg by mouth 2 (two) times daily. 06/06/20   [provider]  mirabegron ER (MYRBETRIQ) 25 MG TB24 tablet Take 1 tablet (25 mg total) by mouth daily. 07/27/20   Jennye Boroughs, MD  OXYGEN Inhale 2-3 L into the lungs as needed (at bedtime).    [provider]  pantoprazole (PROTONIX) 40 MG tablet Take 40 mg by mouth 2 (two) times daily. 10/18/19   [provider]  tamsulosin (FLOMAX) 0.4 MG CAPS capsule Take 0.4 mg by mouth daily. 10/14/19   [provider]  valsartan (DIOVAN) 160 MG tablet Take 160 mg by mouth daily. 05/04/18   [provider]    Allergies Patient has no known allergies.  History reviewed. No pertinent family history.  Social History Social History   Tobacco Use   Smoking status: Every Day    Packs/day: 0.25    Years: 67.00    Pack years: 16.75    Types: Cigarettes   Smokeless tobacco: Never   Tobacco comments:    3 every other day  Vaping Use   Vaping Use: Never used  Substance  Use Topics   Alcohol use: No   Drug use: No    Review of Systems  Review of Systems  Constitutional:  Negative for chills and fever.  HENT:  Negative for sore throat.   Eyes:  Negative for pain.  Respiratory:  Negative for cough and stridor.   Cardiovascular:  Negative for chest pain.  Gastrointestinal:  Negative for vomiting.  Genitourinary:  Positive for dysuria.  Musculoskeletal:  Negative for myalgias.  Skin:  Negative for rash.  Neurological:  Negative for seizures, loss of consciousness and headaches.  Psychiatric/Behavioral:  Negative for suicidal ideas.   All other systems reviewed and are negative.    ____________________________________________   PHYSICAL EXAM:  VITAL SIGNS: ED Triage Vitals  Enc Vitals Group     BP 11/17/20 1838 (!) 127/92     Pulse Rate 11/17/20 1838 89     Resp 11/17/20 1838 18     Temp 11/17/20 1838 98.4 F (36.9 C)     Temp Source 11/17/20 1838 Oral     SpO2 11/17/20 1838  97 %     Weight 11/17/20 1849 140 lb (63.5 kg)     Height 11/17/20 1849 5\' 3"  (1.6 m)     Head Circumference --      Peak Flow --      Pain Score 11/17/20 1849 10     Pain Loc --      Pain Edu? --      Excl. in [place]? --    Vitals:   11/17/20 1838  BP: (!) 127/92  Pulse: 89  Resp: 18  Temp: 98.4 F (36.9 C)  SpO2: 97%   Physical Exam Vitals and nursing note reviewed.  Constitutional:      Appearance: He is well-developed.  HENT:     Head: Normocephalic and atraumatic.     Right Ear: External ear normal.     Left Ear: External ear normal.     Nose: Nose normal.  Eyes:     Conjunctiva/sclera: Conjunctivae normal.  Cardiovascular:     Rate and Rhythm: Normal rate and regular rhythm.     Heart sounds: No murmur heard. Pulmonary:     Effort: Pulmonary effort is normal. No respiratory distress.     Breath sounds: Normal breath sounds.  Abdominal:     Palpations: Abdomen is soft.     Tenderness: There is no abdominal tenderness.  Musculoskeletal:      Cervical back: Neck supple.  Skin:    General: Skin is warm and dry.  Neurological:     Mental Status: He is alert and oriented to person, place, and time.    Scrotum and bilateral testicles unremarkable.  No erythema, tenderness or mass.  Penile shaft is unremarkable.  No lesions or rash.  Suprapubic catheter is in place without surrounding erythema induration or purulent drainage or bleeding. ____________________________________________   LABS (all labs ordered are listed, but only abnormal results are displayed)  Labs Reviewed  CBC WITH DIFFERENTIAL/PLATELET - Abnormal; Notable for the following components:      Result Value   RBC 3.81 (*)    Hemoglobin 12.0 (*)    HCT 36.0 (*)    All other components within normal limits  COMPREHENSIVE METABOLIC PANEL - Abnormal; Notable for the following components:   Glucose, Bld 114 (*)    AST 14 (*)    All other components within normal limits  URINALYSIS, COMPLETE (UACMP) WITH MICROSCOPIC - Abnormal; Notable for the following components:   Color, Urine YELLOW (*)    APPearance TURBID (*)    Hgb urine dipstick LARGE (*)    Protein, ur 100 (*)    Nitrite POSITIVE (*)    Leukocytes,Ua LARGE (*)    RBC / HPF >50 (*)    WBC, UA >50 (*)    Bacteria, UA MANY (*)    All other components within normal limits  URINE CULTURE   ____________________________________________  EKG  ____________________________________________  RADIOLOGY  ED MD interpretation: Scrotal ultrasound shows no evidence of torsion shows bilateral hydroceles is otherwise unremarkable.  Official radiology report(s): US SCROTUM W/DOPPLER  Result Date: 11/17/2020 CLINICAL DATA:  Two days of scrotal pain EXAM: SCROTAL ULTRASOUND DOPPLER ULTRASOUND OF THE TESTICLES TECHNIQUE: Complete ultrasound examination of the testicles, epididymis, and other scrotal structures was performed. Color and spectral Doppler ultrasound were also utilized to evaluate blood flow to the  testicles. COMPARISON:  CT 11/07/2020 FINDINGS: Right testicle Measurements: 3.8 x 2.1 x 2.3 cm. No mass or microlithiasis visualized. Left testicle Measurements: 3.9 x 2.1 x 3.2 cm. No  mass or microlithiasis visualized. Right epididymis:  Normal in size and appearance. Left epididymis:  Normal in size and appearance. Hydrocele:  Small bilateral hydroceles. Varicocele:  None visualized. Pulsed Doppler interrogation of both testes demonstrates normal low resistance arterial and venous waveforms bilaterally. IMPRESSION: Small bilateral hydroceles, nonspecific. No evidence of testicular mass or torsion. Electronically Signed   By: Lovena Le M.D.   On: 11/17/2020 20:36    ____________________________________________   PROCEDURES  Procedure(s) performed (including Critical Care):  Procedures   ____________________________________________   INITIAL IMPRESSION / ASSESSMENT AND PLAN / ED COURSE      Patient presents with above to history exam for assessment approximately 3 months of persistent burning with urination.  This is in the setting of having a suprapubic Foley catheter in place the patient stating he passes most of his urine through his penis.  He has not had any change in his symptoms last couple days or weeks but feels that he is not getting better.  He has not had any fevers, back pain, abdominal pain or any other associated sick symptoms.  On arrival he is afebrile hemodynamically stable.  He did obtain ultrasound ordered in triage that was unremarkable and on exam his scrotum and testicles and penile shaft are unremarkable.  No evidence of cellulitis, torsion, abscess or other clear acute abnormality on scrotal or penile exam.  CBC shows no leukocytosis and hemoglobin of 12 compared to 9.73 months ago.  Patient has no fever or other findings on exam i.e. CVA tenderness to suggest pyelonephritis or sepsis.  CMP is unremarkable.  No significant electrode or metabolic derangements.  UA  obtained from penile and urine shows large hemoglobin 100 protein and positive nitrites, large leukocyte Estrace and greater than 50 RBCs and greater than 50 WBCs with many bacteria.  Urine culture sent.  Unclear why patient still has suprapubic Foley in place and advised patient this should likely be removed to see is primarily only passing urine through his penis.  Believe he requires IV antibiotics at admission at this time given he is no evidence of sepsis kidney injury or obstruction.  Condition he has no acute symptoms he is complaining of today and has a relatively reassuring exam and labs.  We will write Rx for Bactrim as he is no recent cultures and will cover for possible complicated acute cystitis advised patient he will likely need his Foley removed by urology.  Advised him to schedule follow-up appointment soon as he is able to.  He is amenable this plan.  Discharged stable patient.  Strict return precautions advised discussed.       ____________________________________________   FINAL CLINICAL IMPRESSION(S) / ED DIAGNOSES  Final diagnoses:  Acute cystitis with hematuria    Medications - No data to display   ED Discharge Orders          Ordered    sulfamethoxazole-trimethoprim (BACTRIM DS) 800-160 MG tablet  2 times daily        11/17/20 2140             Note:  This document was prepared using Dragon voice recognition software and may include unintentional dictation errors.    Lucrezia Starch, MD 11/17/20 [phone]

## 2020-11-17 NOTE — ED Notes (Signed)
Pt to ultrasound

## 2020-11-17 NOTE — ED Triage Notes (Signed)
Pt arrives to ER with suprapubic cath. States a month ago they detached the bag from the catheter and it hasn't been connected to anything, currently has a cap thing on it. Pt c/o of pain in pubic area. Pt c/o of pain in bladder/pubic region.

## 2020-11-17 NOTE — ED Notes (Signed)
Owner to ED to have discharge instructions went over and to pick up pt. Signed copy of discharge in medical records bin, witnessed by Probation officer.

## 2020-11-17 NOTE — ED Notes (Signed)
Spoke with [facility] and she reports she will have the owner come and pick pt up.   Phone Numbers:   [phone] Owner: [phone]

## 2020-11-17 NOTE — ED Triage Notes (Signed)
Pt in via EMS from a group home with c/o pubic pain and dysuria  Pt has suprapubic cath placed 4 months ago but they detached the bag 2 days ago so it just the port hanging and pt c/o burning and pain in the pubic area and when he urinates

## 2020-11-19 LAB — URINE CULTURE

## 2020-12-26 NOTE — Progress Notes (Signed)
Chief complaint: Aneurysm surveillance History of present illness: Patient is previously seen by Dr. 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  The ascending aortic component is mildly dilated.  He continues to smoke.  His primary complaint relates to a suprapubic catheter which was placed after prostate treatment. 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   HLD (hyperlipidemia) 03/09/2017   Thoracic aortic aneurysm without rupture ([place]) 12/18/2019   Status post surgery 06/22/2020   AAA (abdominal aortic aneurysm) ([place]) 06/23/2020   UTI (urinary tract infection) 07/22/2020   Foley catheter in place 07/22/2020   GERD (gastroesophageal reflux disease) 07/22/2020   Elevated lactic acid level 07/22/2020   Gross hematuria 07/22/2020   Hematuria 07/23/2020   Protein-calorie malnutrition, severe 07/24/2020   Resolved Ambulatory Problems    Diagnosis Date Noted   No Resolved Ambulatory Problems   Past Medical History:  Diagnosis Date   Back pain    Cardiomegaly    Cystic kidney disease    Depressive disorder    High cholesterol    History of prostate cancer    Hypertension    Prostate cancer ([place]) 2018   Thyroid nodule     Current Outpatient Medications on File Prior to Visit  Medication Sig Dispense Refill   albuterol (PROVENTIL) (2.5 MG/3ML) 0.083% nebulizer solution Take 2.5 mg by nebulization every 6 (six) hours as needed.     amLODipine (NORVASC) 5 MG tablet Take 5 mg by mouth every morning.     aspirin EC 81 MG EC tablet Take 1 tablet (81 mg total) by mouth daily at 6 (six) AM. Swallow  whole. 30 tablet 11   atorvastatin (LIPITOR) 40 MG tablet Take 1 tablet (40 mg total) by mouth at bedtime. 30 tablet 0   budesonide-formoterol (SYMBICORT) 160-4.5 MCG/ACT inhaler Inhale 2 puffs into the lungs 2 (two) times daily. 1 Inhaler 2   Cholecalciferol (VITAMIN D3) 50 MCG (2000 UT) CHEW Chew 2,000 Units by mouth daily.     donepezil (ARICEPT) 5 MG tablet Take 5 mg by mouth at bedtime.     ipratropium-albuterol (DUONEB) 0.5-2.5 (3) MG/3ML SOLN Inhale 3 mLs into the lungs every 6 (six) hours as needed (Wheezing).     megestrol (MEGACE) 20 MG tablet Take 20 mg by mouth 2 (two) times daily.     mirabegron ER (MYRBETRIQ) 25 MG TB24 tablet Take 1 tablet (25 mg total) by mouth daily. 30 tablet 0   OXYGEN Inhale 2-3 L into the lungs as needed (at bedtime).     pantoprazole (PROTONIX) 40 MG tablet Take 40 mg by mouth 2 (two) times daily.     tamsulosin (FLOMAX) 0.4 MG CAPS capsule Take 0.4 mg by mouth daily.     valsartan (DIOVAN) 160 MG tablet Take 160 mg by mouth daily.     No current facility-administered medications on file prior to visit.   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

## 2021-04-04 ENCOUNTER — Other Ambulatory Visit: Payer: Self-pay

## 2021-04-04 ENCOUNTER — Emergency Department: Payer: Medicare Other

## 2021-04-04 DIAGNOSIS — I10 Essential (primary) hypertension: Secondary | ICD-10-CM | POA: Diagnosis not present

## 2021-04-04 DIAGNOSIS — Z79899 Other long term (current) drug therapy: Secondary | ICD-10-CM | POA: Diagnosis not present

## 2021-04-04 DIAGNOSIS — F17210 Nicotine dependence, cigarettes, uncomplicated: Secondary | ICD-10-CM | POA: Diagnosis not present

## 2021-04-04 DIAGNOSIS — S0990XA Unspecified injury of head, initial encounter: Secondary | ICD-10-CM | POA: Diagnosis present

## 2021-04-04 DIAGNOSIS — Z950 Presence of cardiac pacemaker: Secondary | ICD-10-CM | POA: Insufficient documentation

## 2021-04-04 DIAGNOSIS — Z7982 Long term (current) use of aspirin: Secondary | ICD-10-CM | POA: Diagnosis not present

## 2021-04-04 DIAGNOSIS — J449 Chronic obstructive pulmonary disease, unspecified: Secondary | ICD-10-CM | POA: Diagnosis not present

## 2021-04-04 DIAGNOSIS — S0083XA Contusion of other part of head, initial encounter: Secondary | ICD-10-CM | POA: Insufficient documentation

## 2021-04-04 DIAGNOSIS — Z8546 Personal history of malignant neoplasm of prostate: Secondary | ICD-10-CM | POA: Insufficient documentation

## 2021-04-04 DIAGNOSIS — Z7951 Long term (current) use of inhaled steroids: Secondary | ICD-10-CM | POA: Insufficient documentation

## 2021-04-04 DIAGNOSIS — W19XXXA Unspecified fall, initial encounter: Secondary | ICD-10-CM | POA: Diagnosis not present

## 2021-04-04 DIAGNOSIS — S0081XA Abrasion of other part of head, initial encounter: Secondary | ICD-10-CM | POA: Insufficient documentation

## 2021-04-04 LAB — CBC
HCT: 37.7 % — ABNORMAL LOW (ref 39.0–52.0)
Hemoglobin: 12.1 g/dL — ABNORMAL LOW (ref 13.0–17.0)
MCH: 30.8 pg (ref 26.0–34.0)
MCHC: 32.1 g/dL (ref 30.0–36.0)
MCV: 95.9 fL (ref 80.0–100.0)
Platelets: 164 10*3/uL (ref 150–400)
RBC: 3.93 MIL/uL — ABNORMAL LOW (ref 4.22–5.81)
RDW: 13.3 % (ref 11.5–15.5)
WBC: 4.9 10*3/uL (ref 4.0–10.5)
nRBC: 0 % (ref 0.0–0.2)

## 2021-04-04 LAB — COMPREHENSIVE METABOLIC PANEL
ALT: 7 U/L (ref 0–44)
AST: 11 U/L — ABNORMAL LOW (ref 15–41)
Albumin: 3.1 g/dL — ABNORMAL LOW (ref 3.5–5.0)
Alkaline Phosphatase: 74 U/L (ref 38–126)
Anion gap: 8 (ref 5–15)
BUN: 11 mg/dL (ref 8–23)
CO2: 29 mmol/L (ref 22–32)
Calcium: 8.8 mg/dL — ABNORMAL LOW (ref 8.9–10.3)
Chloride: 103 mmol/L (ref 98–111)
Creatinine, Ser: 1.14 mg/dL (ref 0.61–1.24)
GFR, Estimated: >60 mL/min (ref >60)
Glucose, Bld: 111 mg/dL — ABNORMAL HIGH (ref 70–99)
Potassium: 3.2 mmol/L — ABNORMAL LOW (ref 3.5–5.1)
Sodium: 140 mmol/L (ref 135–145)
Total Bilirubin: 1.2 mg/dL (ref 0.3–1.2)
Total Protein: 6.1 g/dL — ABNORMAL LOW (ref 6.5–8.1)

## 2021-04-04 NOTE — ED Triage Notes (Signed)
Pt BIB by EMS due having a fall yesterday. Pt has abrasions to left side of face and nose. Pt denies pain at this time. Pt is from group home.

## 2021-04-04 NOTE — ED Provider Notes (Signed)
Emergency Medicine Provider Triage Evaluation Note  James Dickerson, a 80 y.o. male  was evaluated in triage.  Pt complains of dizziness and fall.  Patient presents from his group home with reports of a fall yesterday.  According to the patient, he was walking into the home when he felt dizzy, and grabbed the door frame.  He apparently tripped hitting his face on the door jam.  He denies any LOC, nausea, vomiting, or nosebleed.  He presents today with bruising and swelling to the central and left side of the face.  He denies any pain at this time.  Review of Systems  Positive: Facial contusion and ecchymosis Negative: Syncope, neck pain, LOC  Physical Exam  BP (!) 165/87 (BP Location: Right Arm)   Pulse 66   Temp 98 F (36.7 C) (Oral)   Resp 16   SpO2 96%  Gen:   Awake, no distress  NAD Resp:  Normal effort CTA MSK:   Moves extremities without difficulty  Other:  CVS: RRR  Medical Decision Making  Medically screening exam initiated at 10:34 PM.  Appropriate orders placed.  James Dickerson was informed that the remainder of the evaluation will be completed by another provider, this initial triage assessment does not replace that evaluation, and the importance of remaining in the ED until their evaluation is complete.  Patient with ED evaluation of injury sustained following a fall.  Patient reports an injury yesterday when he felt dizzy walking into the door.  He presents to the ED with facial bruising and swelling.   Melvenia Needles, PA-C 04/04/21 2236    Duffy Bruce, MD 04/07/21 [phone]

## 2021-04-05 ENCOUNTER — Emergency Department
Admission: EM | Admit: 2021-04-05 | Discharge: 2021-04-05 | Disposition: A | Discharge status: Home / Self Care | Payer: Medicare Other | Attending: Emergency Medicine | Admitting: Emergency Medicine

## 2021-04-05 DIAGNOSIS — T148XXA Other injury of unspecified body region, initial encounter: Secondary | ICD-10-CM

## 2021-04-05 DIAGNOSIS — W19XXXA Unspecified fall, initial encounter: Secondary | ICD-10-CM

## 2021-04-05 DIAGNOSIS — S0083XA Contusion of other part of head, initial encounter: Secondary | ICD-10-CM

## 2021-04-05 NOTE — ED Provider Notes (Signed)
[facility] Emergency Department Provider Note   ____________________________________________    I have reviewed the triage vital signs and the nursing notes.   HISTORY  Chief Complaint Fall     HPI James Dickerson is a 80 y.o. male with history as below who presents after a fall.  Patient reports he had episode of dizziness yesterday and fell.  He is feeling better today.  He did fall forward and hit his face on the door.  No LOC.  No nausea vomiting.  No extremity injuries reported.  No neurodeficits.  Has not taken anything for this  Past Medical History:  Diagnosis Date   Back pain    Cardiomegaly    COPD (chronic obstructive pulmonary disease) (HCC)    Cystic kidney disease    Depressive disorder    GERD (gastroesophageal reflux disease)    High cholesterol    History of prostate cancer    Hypertension    Prostate cancer ([place]) 2018   Rad seeds   Thyroid nodule     Patient Active Problem List   Diagnosis Date Noted   Protein-calorie malnutrition, severe 07/24/2020   Hematuria 07/23/2020   UTI (urinary tract infection) 07/22/2020   Foley catheter in place 07/22/2020   GERD (gastroesophageal reflux disease) 07/22/2020   Elevated lactic acid level 07/22/2020   Gross hematuria 07/22/2020   AAA (abdominal aortic aneurysm) 06/23/2020   Status post surgery 06/22/2020   Thoracic aortic aneurysm without rupture 12/18/2019   Respiratory failure ([place]) 07/11/2017   Acute respiratory failure ([place]) 07/10/2017   CAP (community acquired pneumonia) 07/10/2017   Influenza A 07/10/2017   COPD (chronic obstructive pulmonary disease) ([place]) 07/10/2017   HLD (hyperlipidemia) 03/09/2017   Essential hypertension 08/14/2016   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   Bradycardia 08/05/2016    Past Surgical History:  Procedure Laterality Date   ABDOMINAL AORTIC ENDOVASCULAR STENT GRAFT N/A 06/22/2020   Procedure: ABDOMINAL AORTIC ENDOVASCULAR  STENT GRAFT;  Surgeon: Cherre Robins, MD;  Location: [place];  Service: Vascular;  Laterality: N/A;   [place] N/A 06/22/2020   Procedure: CYSTOSCOPY WITH URETHRAL DILATATION;  Surgeon: Alexis Frock, MD;  Location: [place];  Service: Urology;  Laterality: N/A;   INSERTION OF ILIAC STENT Right 06/22/2020   Procedure: INSERTION OF RIGHT EXTERNAL ILIAC ARTERY STENT;  Surgeon: Cherre Robins, MD;  Location: [place];  Service: Vascular;  Laterality: Right;   INSERTION OF SUPRAPUBIC CATHETER N/A 06/22/2020   Procedure: INSERTION OF SUPRAPUBIC CATHETER;  Surgeon: Alexis Frock, MD;  Location: [place];  Service: Urology;  Laterality: N/A;   PACEMAKER INSERTION N/A 08/06/2016   Procedure: INSERTION PACEMAKER;  Surgeon: Isaias Cowman, MD;  Location: [facility];  Service: Cardiovascular;  Laterality: N/A;    Prior to Admission medications   Medication Sig Start Date End Date Taking? Authorizing Provider  albuterol (PROVENTIL) (2.5 MG/3ML) 0.083% nebulizer solution Take 2.5 mg by nebulization every 6 (six) hours as needed. 07/15/20   [provider]  amLODipine (NORVASC) 5 MG tablet Take 5 mg by mouth every morning. 09/20/19   [provider]  aspirin EC 81 MG EC tablet Take 1 tablet (81 mg total) by mouth daily at 6 (six) AM. Swallow whole. 06/29/20   Setzer, Edman Circle, PA-C  atorvastatin (LIPITOR) 40 MG tablet Take 1 tablet (40 mg total) by mouth at bedtime. 06/17/18   Mayo, Pete Pelt, MD  budesonide-formoterol [facility])  160-4.5 MCG/ACT inhaler Inhale 2 puffs into the lungs 2 (two) times daily. 02/13/18   Merlyn Lot, MD  Cholecalciferol (VITAMIN D3) 50 MCG (2000 UT) CHEW Chew 2,000 Units by mouth daily.    [provider]  donepezil (ARICEPT) 5 MG tablet Take 5 mg by mouth at bedtime.    [provider]  ipratropium-albuterol (DUONEB) 0.5-2.5 (3) MG/3ML SOLN Inhale 3 mLs into the lungs every 6 (six) hours as needed  (Wheezing). 01/21/20   [provider]  megestrol (MEGACE) 20 MG tablet Take 20 mg by mouth 2 (two) times daily. 06/06/20   [provider]  mirabegron ER (MYRBETRIQ) 25 MG TB24 tablet Take 1 tablet (25 mg total) by mouth daily. 07/27/20   Jennye Boroughs, MD  OXYGEN Inhale 2-3 L into the lungs as needed (at bedtime).    [provider]  pantoprazole (PROTONIX) 40 MG tablet Take 40 mg by mouth 2 (two) times daily. 10/18/19   [provider]  tamsulosin (FLOMAX) 0.4 MG CAPS capsule Take 0.4 mg by mouth daily. 10/14/19   [provider]  valsartan (DIOVAN) 160 MG tablet Take 160 mg by mouth daily. 05/04/18   [provider]     Allergies Patient has no known allergies.  No family history on file.  Social History Social History   Tobacco Use   Smoking status: Every Day    Packs/day: 0.25    Years: 67.00    Pack years: 16.75    Types: Cigarettes   Smokeless tobacco: Never   Tobacco comments:    3 every other day  Vaping Use   Vaping Use: Never used  Substance Use Topics   Alcohol use: No   Drug use: No    Review of Systems  Constitutional: No fever/chills Eyes: No visual changes.  ENT: Nasal swelling Cardiovascular: Denies chest pain. Respiratory: Denies shortness of breath. Gastrointestinal: No abdominal pain.  No nausea, no vomiting.   Genitourinary: Negative for dysuria. Musculoskeletal: No extremity injury Skin: Negative for rash. Neurological: Negative for headaches    ____________________________________________   PHYSICAL EXAM:  VITAL SIGNS: ED Triage Vitals  Enc Vitals Group     BP 04/04/21 2155 (!) 165/87     Pulse Rate 04/04/21 2155 66     Resp 04/04/21 2155 16     Temp 04/04/21 2155 98 F (36.7 C)     Temp Source 04/04/21 2155 Oral     SpO2 04/04/21 2155 96 %     Weight --      Height --      Head Circumference --      Peak Flow --      Pain Score 04/04/21 2156 0     Pain Loc --      Pain Edu? --       Excl. in [place]? --     Constitutional: Alert and oriented.  Eyes: Conjunctivae are normal.  Head: Contusion ecchymosis to the left face Nose: Swelling to the bridge of the nose Mouth/Throat: Mucous membranes are moist.   Neck:  Painless ROM, no pain with axial load, no vertebral tenderness to palpation Cardiovascular: Normal rate, regular rhythm. Grossly normal heart sounds.  Good peripheral circulation.  No chest wall tenderness palpation Respiratory: Normal respiratory effort.  No retractions. Lungs CTAB. Gastrointestinal: Soft and nontender. No distention.  Genitourinary: deferred Musculoskeletal: Normal range of motion of all extremities warm and well perfused Neurologic:  Normal speech and language. No gross focal neurologic deficits are appreciated.  Skin:  Skin is warm, dry and intact. No rash noted. Psychiatric: Mood and affect are normal. Speech and behavior are normal.  ____________________________________________   LABS (all labs ordered are listed, but only abnormal results are displayed)  Labs Reviewed  CBC - Abnormal; Notable for the following components:      Result Value   RBC 3.93 (*)    Hemoglobin 12.1 (*)    HCT 37.7 (*)    All other components within normal limits  COMPREHENSIVE METABOLIC PANEL - Abnormal; Notable for the following components:   Potassium 3.2 (*)    Glucose, Bld 111 (*)    Calcium 8.8 (*)    Total Protein 6.1 (*)    Albumin 3.1 (*)    AST 11 (*)    All other components within normal limits   ____________________________________________  EKG  ED ECG REPORT I, Lavonia Drafts, the attending physician, personally viewed and interpreted this ECG.  Date: 04/05/2021  Rhythm: Ventricular paced rhythm QRS Axis: normal Intervals: normal ST/T Wave abnormalities: normal Narrative Interpretation: no evidence of acute ischemia  ____________________________________________  RADIOLOGY  CT head and cervical spine reviewed by me, no acute  abnormality ____________________________________________   PROCEDURES  Procedure(s) performed: No  Procedures   Critical Care performed: No ____________________________________________   INITIAL IMPRESSION / ASSESSMENT AND PLAN / ED COURSE  Pertinent labs & imaging results that were available during my care of the patient were reviewed by me and considered in my medical decision making (see chart for details).   Patient presents after fall yesterday, no longer feeling dizzy, feels to be at his baseline.  Facial contusion, nasal swelling as above, CT head cervical spine max face are without acute abnormalities.  Lab work demonstrates normal hemoglobin, normal white blood cell count, normal kidney function.  Supportive care is recommended.  Outpatient follow-up with PCP.    ____________________________________________   FINAL CLINICAL IMPRESSION(S) / ED DIAGNOSES  Final diagnoses:  Fall, initial encounter  Contusion of face, initial encounter  Abrasion        Note:  This document was prepared using Dragon voice recognition software and may include unintentional dictation errors.    Lavonia Drafts, MD 04/05/21 [phone]

## 2021-04-05 NOTE — ED Triage Notes (Signed)
Several attempts to contact care home, no answer and voicemail box full.

## 2021-04-05 NOTE — ED Triage Notes (Signed)
Spoke with pt daughter, Jimmey Ralph, advised pt ready for d/c. [place] called facility owner Ivin Booty who is on the way to pick pt up.

## 2021-04-24 ENCOUNTER — Inpatient Hospital Stay: Payer: Medicare Other

## 2021-04-24 ENCOUNTER — Emergency Department: Payer: Medicare Other

## 2021-04-24 ENCOUNTER — Inpatient Hospital Stay
Admission: EM | Admit: 2021-04-24 | Discharge: 2021-05-01 | DRG: 871 | Disposition: A | Discharge status: Skilled Nursing Facility | Payer: Medicare Other | Attending: Internal Medicine | Admitting: Internal Medicine

## 2021-04-24 ENCOUNTER — Encounter: Payer: Self-pay | Admitting: Emergency Medicine

## 2021-04-24 ENCOUNTER — Other Ambulatory Visit: Payer: Self-pay

## 2021-04-24 DIAGNOSIS — Z515 Encounter for palliative care: Secondary | ICD-10-CM | POA: Diagnosis not present

## 2021-04-24 DIAGNOSIS — K219 Gastro-esophageal reflux disease without esophagitis: Secondary | ICD-10-CM | POA: Diagnosis present

## 2021-04-24 DIAGNOSIS — Z6824 Body mass index (BMI) 24.0-24.9, adult: Secondary | ICD-10-CM

## 2021-04-24 DIAGNOSIS — F0390 Unspecified dementia without behavioral disturbance: Secondary | ICD-10-CM | POA: Diagnosis present

## 2021-04-24 DIAGNOSIS — J3800 Paralysis of vocal cords and larynx, unspecified: Secondary | ICD-10-CM | POA: Diagnosis present

## 2021-04-24 DIAGNOSIS — S8001XA Contusion of right knee, initial encounter: Secondary | ICD-10-CM | POA: Diagnosis present

## 2021-04-24 DIAGNOSIS — M25559 Pain in unspecified hip: Secondary | ICD-10-CM | POA: Diagnosis not present

## 2021-04-24 DIAGNOSIS — F17210 Nicotine dependence, cigarettes, uncomplicated: Secondary | ICD-10-CM | POA: Diagnosis present

## 2021-04-24 DIAGNOSIS — S0083XA Contusion of other part of head, initial encounter: Secondary | ICD-10-CM | POA: Diagnosis present

## 2021-04-24 DIAGNOSIS — A4151 Sepsis due to Escherichia coli [E. coli]: Principal | ICD-10-CM | POA: Diagnosis present

## 2021-04-24 DIAGNOSIS — R4182 Altered mental status, unspecified: Secondary | ICD-10-CM | POA: Diagnosis present

## 2021-04-24 DIAGNOSIS — Z7982 Long term (current) use of aspirin: Secondary | ICD-10-CM

## 2021-04-24 DIAGNOSIS — D649 Anemia, unspecified: Secondary | ICD-10-CM | POA: Diagnosis present

## 2021-04-24 DIAGNOSIS — Z7401 Bed confinement status: Secondary | ICD-10-CM

## 2021-04-24 DIAGNOSIS — S82831A Other fracture of upper and lower end of right fibula, initial encounter for closed fracture: Secondary | ICD-10-CM | POA: Diagnosis present

## 2021-04-24 DIAGNOSIS — I7411 Embolism and thrombosis of thoracic aorta: Secondary | ICD-10-CM | POA: Diagnosis present

## 2021-04-24 DIAGNOSIS — E7800 Pure hypercholesterolemia, unspecified: Secondary | ICD-10-CM | POA: Diagnosis present

## 2021-04-24 DIAGNOSIS — A4150 Gram-negative sepsis, unspecified: Secondary | ICD-10-CM

## 2021-04-24 DIAGNOSIS — Z8546 Personal history of malignant neoplasm of prostate: Secondary | ICD-10-CM

## 2021-04-24 DIAGNOSIS — E43 Unspecified severe protein-calorie malnutrition: Secondary | ICD-10-CM | POA: Diagnosis present

## 2021-04-24 DIAGNOSIS — R4701 Aphasia: Secondary | ICD-10-CM | POA: Diagnosis present

## 2021-04-24 DIAGNOSIS — Z7951 Long term (current) use of inhaled steroids: Secondary | ICD-10-CM

## 2021-04-24 DIAGNOSIS — I712 Thoracic aortic aneurysm, without rupture, unspecified: Secondary | ICD-10-CM | POA: Diagnosis present

## 2021-04-24 DIAGNOSIS — T148XXA Other injury of unspecified body region, initial encounter: Secondary | ICD-10-CM

## 2021-04-24 DIAGNOSIS — N390 Urinary tract infection, site not specified: Secondary | ICD-10-CM | POA: Diagnosis present

## 2021-04-24 DIAGNOSIS — R609 Edema, unspecified: Secondary | ICD-10-CM

## 2021-04-24 DIAGNOSIS — N3281 Overactive bladder: Secondary | ICD-10-CM | POA: Diagnosis present

## 2021-04-24 DIAGNOSIS — I10 Essential (primary) hypertension: Secondary | ICD-10-CM | POA: Diagnosis present

## 2021-04-24 DIAGNOSIS — E876 Hypokalemia: Secondary | ICD-10-CM | POA: Diagnosis present

## 2021-04-24 DIAGNOSIS — R778 Other specified abnormalities of plasma proteins: Secondary | ICD-10-CM | POA: Diagnosis present

## 2021-04-24 DIAGNOSIS — W19XXXA Unspecified fall, initial encounter: Secondary | ICD-10-CM | POA: Diagnosis present

## 2021-04-24 DIAGNOSIS — Z66 Do not resuscitate: Secondary | ICD-10-CM | POA: Diagnosis not present

## 2021-04-24 DIAGNOSIS — J449 Chronic obstructive pulmonary disease, unspecified: Secondary | ICD-10-CM | POA: Diagnosis present

## 2021-04-24 DIAGNOSIS — F03B0 Unspecified dementia, moderate, without behavioral disturbance, psychotic disturbance, mood disturbance, and anxiety: Secondary | ICD-10-CM | POA: Diagnosis not present

## 2021-04-24 DIAGNOSIS — Z8673 Personal history of transient ischemic attack (TIA), and cerebral infarction without residual deficits: Secondary | ICD-10-CM

## 2021-04-24 DIAGNOSIS — S22089A Unspecified fracture of T11-T12 vertebra, initial encounter for closed fracture: Secondary | ICD-10-CM | POA: Diagnosis present

## 2021-04-24 DIAGNOSIS — Z20822 Contact with and (suspected) exposure to covid-19: Secondary | ICD-10-CM | POA: Diagnosis present

## 2021-04-24 DIAGNOSIS — N401 Enlarged prostate with lower urinary tract symptoms: Secondary | ICD-10-CM | POA: Diagnosis present

## 2021-04-24 DIAGNOSIS — G9341 Metabolic encephalopathy: Secondary | ICD-10-CM | POA: Diagnosis present

## 2021-04-24 DIAGNOSIS — S2241XA Multiple fractures of ribs, right side, initial encounter for closed fracture: Secondary | ICD-10-CM | POA: Diagnosis present

## 2021-04-24 DIAGNOSIS — Z79899 Other long term (current) drug therapy: Secondary | ICD-10-CM

## 2021-04-24 DIAGNOSIS — R64 Cachexia: Secondary | ICD-10-CM | POA: Diagnosis present

## 2021-04-24 DIAGNOSIS — S0011XA Contusion of right eyelid and periocular area, initial encounter: Secondary | ICD-10-CM | POA: Diagnosis not present

## 2021-04-24 DIAGNOSIS — I160 Hypertensive urgency: Secondary | ICD-10-CM | POA: Diagnosis present

## 2021-04-24 DIAGNOSIS — R1311 Dysphagia, oral phase: Secondary | ICD-10-CM | POA: Diagnosis present

## 2021-04-24 DIAGNOSIS — R491 Aphonia: Secondary | ICD-10-CM | POA: Diagnosis present

## 2021-04-24 DIAGNOSIS — I7122 Aneurysm of the aortic arch, without rupture: Secondary | ICD-10-CM | POA: Diagnosis not present

## 2021-04-24 DIAGNOSIS — R296 Repeated falls: Secondary | ICD-10-CM | POA: Diagnosis present

## 2021-04-24 LAB — CBC
HCT: 38 % — ABNORMAL LOW (ref 39.0–52.0)
Hemoglobin: 12.5 g/dL — ABNORMAL LOW (ref 13.0–17.0)
MCH: 30.8 pg (ref 26.0–34.0)
MCHC: 32.9 g/dL (ref 30.0–36.0)
MCV: 93.6 fL (ref 80.0–100.0)
Platelets: 154 10*3/uL (ref 150–400)
RBC: 4.06 MIL/uL — ABNORMAL LOW (ref 4.22–5.81)
RDW: 13.2 % (ref 11.5–15.5)
WBC: 8.7 10*3/uL (ref 4.0–10.5)
nRBC: 0 % (ref 0.0–0.2)

## 2021-04-24 LAB — BASIC METABOLIC PANEL
Anion gap: 8 (ref 5–15)
BUN: 21 mg/dL (ref 8–23)
CO2: 26 mmol/L (ref 22–32)
Calcium: 9.2 mg/dL (ref 8.9–10.3)
Chloride: 101 mmol/L (ref 98–111)
Creatinine, Ser: 1.19 mg/dL (ref 0.61–1.24)
GFR, Estimated: >60 mL/min (ref >60)
Glucose, Bld: 117 mg/dL — ABNORMAL HIGH (ref 70–99)
Potassium: 3.2 mmol/L — ABNORMAL LOW (ref 3.5–5.1)
Sodium: 135 mmol/L (ref 135–145)

## 2021-04-24 LAB — RESP PANEL BY RT-PCR (FLU A&B, COVID) ARPGX2
Influenza A by PCR: NEGATIVE
Influenza B by PCR: NEGATIVE
SARS Coronavirus 2 by RT PCR: NEGATIVE

## 2021-04-24 LAB — URINALYSIS, ROUTINE W REFLEX MICROSCOPIC
Bacteria, UA: NONE SEEN
Bilirubin Urine: NEGATIVE
Glucose, UA: NEGATIVE mg/dL
Ketones, ur: 5 mg/dL — AB
Nitrite: NEGATIVE
Protein, ur: 30 mg/dL — AB
Specific Gravity, Urine: >1.046 — ABNORMAL HIGH (ref 1.005–1.030)
pH: 6 (ref 5.0–8.0)

## 2021-04-24 LAB — TROPONIN I (HIGH SENSITIVITY): Troponin I (High Sensitivity): 48 ng/L — ABNORMAL HIGH (ref <18)

## 2021-04-24 MED ORDER — TAMSULOSIN HCL 0.4 MG PO CAPS
0.4000 mg | ORAL_CAPSULE | Freq: Every day | ORAL | Status: DC
  Administered 2021-04-27 – 2021-05-01 (×5): 0.4 mg via ORAL
  Filled 2021-04-24 (×6): qty 1

## 2021-04-24 MED ORDER — ESMOLOL HCL-SODIUM CHLORIDE 2000 MG/100ML IV SOLN
25.0000 ug/kg/min | INTRAVENOUS | Status: DC
  Administered 2021-04-24: 22:00:00 25 ug/kg/min via INTRAVENOUS
  Administered 2021-04-25: 75 ug/kg/min via INTRAVENOUS
  Administered 2021-04-25: 150 ug/kg/min via INTRAVENOUS
  Administered 2021-04-25: 06:00:00 125 ug/kg/min via INTRAVENOUS
  Administered 2021-04-26: 100 ug/kg/min via INTRAVENOUS
  Administered 2021-04-26: 150 ug/kg/min via INTRAVENOUS
  Administered 2021-04-26: 100 ug/kg/min via INTRAVENOUS
  Administered 2021-04-27: 14:00:00 25 ug/kg/min via INTRAVENOUS
  Administered 2021-04-28: 75 ug/kg/min via INTRAVENOUS
  Filled 2021-04-24 (×5): qty 100
  Filled 2021-04-24: qty 200
  Filled 2021-04-24 (×3): qty 100

## 2021-04-24 MED ORDER — ATORVASTATIN CALCIUM 20 MG PO TABS
40.0000 mg | ORAL_TABLET | Freq: Every day | ORAL | Status: DC
  Administered 2021-04-26 – 2021-04-30 (×5): 40 mg via ORAL
  Filled 2021-04-24 (×6): qty 2

## 2021-04-24 MED ORDER — IRBESARTAN 150 MG PO TABS
150.0000 mg | ORAL_TABLET | Freq: Every day | ORAL | Status: DC
  Administered 2021-04-25 – 2021-05-01 (×7): 150 mg via ORAL
  Filled 2021-04-24 (×9): qty 1

## 2021-04-24 MED ORDER — NICARDIPINE HCL IN NACL 20-0.86 MG/200ML-% IV SOLN: 3.0000 mg/h | INTRAVENOUS | Status: DC

## 2021-04-24 MED ORDER — LORAZEPAM 2 MG/ML IJ SOLN
1.0000 mg | Freq: Once | INTRAMUSCULAR | Status: DC
  Filled 2021-04-24: qty 1

## 2021-04-24 MED ORDER — SODIUM CHLORIDE 0.9 % IV SOLN
2.0000 g | INTRAVENOUS | Status: DC
  Administered 2021-04-25 – 2021-04-27 (×3): 2 g via INTRAVENOUS
  Filled 2021-04-24: qty 2
  Filled 2021-04-24: qty 20
  Filled 2021-04-24: qty 2

## 2021-04-24 MED ORDER — MIRABEGRON ER 25 MG PO TB24
25.0000 mg | ORAL_TABLET | Freq: Every day | ORAL | Status: DC
  Administered 2021-04-25 – 2021-05-01 (×5): 25 mg via ORAL
  Filled 2021-04-24 (×7): qty 1

## 2021-04-24 MED ORDER — IOHEXOL 350 MG/ML SOLN
80.0000 mL | Freq: Once | INTRAVENOUS | Status: AC | PRN
  Administered 2021-04-24: 80 mL via INTRAVENOUS

## 2021-04-24 MED ORDER — KETOROLAC TROMETHAMINE 15 MG/ML IJ SOLN
15.0000 mg | Freq: Four times a day (QID) | INTRAMUSCULAR | Status: AC | PRN
  Filled 2021-04-24: qty 1

## 2021-04-24 MED ORDER — HYDRALAZINE HCL 20 MG/ML IJ SOLN
10.0000 mg | Freq: Once | INTRAMUSCULAR | Status: AC
  Administered 2021-04-24: 10 mg via INTRAVENOUS
  Filled 2021-04-24: qty 1

## 2021-04-24 MED ORDER — ONDANSETRON HCL 4 MG/2ML IJ SOLN: 4.0000 mg | Freq: Four times a day (QID) | INTRAMUSCULAR | Status: DC | PRN

## 2021-04-24 MED ORDER — SODIUM CHLORIDE 0.9 % IV SOLN
1.0000 g | Freq: Once | INTRAVENOUS | Status: AC
  Administered 2021-04-24: 1 g via INTRAVENOUS
  Filled 2021-04-24: qty 10

## 2021-04-24 MED ORDER — MEGESTROL ACETATE 20 MG PO TABS
40.0000 mg | ORAL_TABLET | Freq: Every day | ORAL | Status: DC
  Administered 2021-04-25 – 2021-05-01 (×7): 40 mg via ORAL
  Filled 2021-04-24 (×7): qty 2

## 2021-04-24 MED ORDER — ACETAMINOPHEN 650 MG RE SUPP
650.0000 mg | Freq: Four times a day (QID) | RECTAL | Status: DC | PRN
  Filled 2021-04-24: qty 1

## 2021-04-24 MED ORDER — IPRATROPIUM-ALBUTEROL 0.5-2.5 (3) MG/3ML IN SOLN: 3.0000 mL | Freq: Four times a day (QID) | RESPIRATORY_TRACT | Status: DC | PRN

## 2021-04-24 MED ORDER — MORPHINE SULFATE (PF) 2 MG/ML IV SOLN
2.0000 mg | INTRAVENOUS | Status: DC | PRN
  Administered 2021-04-25 (×2): 2 mg via INTRAVENOUS
  Filled 2021-04-24 (×4): qty 1

## 2021-04-24 MED ORDER — DONEPEZIL HCL 5 MG PO TABS
10.0000 mg | ORAL_TABLET | Freq: Every day | ORAL | Status: DC
  Administered 2021-04-25 – 2021-05-01 (×7): 10 mg via ORAL
  Filled 2021-04-24 (×7): qty 2

## 2021-04-24 MED ORDER — ACETAMINOPHEN 325 MG PO TABS: 650.0000 mg | ORAL_TABLET | Freq: Four times a day (QID) | ORAL | Status: DC | PRN

## 2021-04-24 MED ORDER — TRAZODONE HCL 50 MG PO TABS: 25.0000 mg | ORAL_TABLET | Freq: Every evening | ORAL | Status: DC | PRN

## 2021-04-24 MED ORDER — ONDANSETRON HCL 4 MG PO TABS: 4.0000 mg | ORAL_TABLET | Freq: Four times a day (QID) | ORAL | Status: DC | PRN

## 2021-04-24 MED ORDER — PANTOPRAZOLE SODIUM 40 MG PO TBEC
40.0000 mg | DELAYED_RELEASE_TABLET | Freq: Two times a day (BID) | ORAL | Status: DC
  Administered 2021-04-25 – 2021-04-27 (×4): 40 mg via ORAL
  Filled 2021-04-24 (×6): qty 1

## 2021-04-24 MED ORDER — VITAMIN D 25 MCG (1000 UNIT) PO TABS
2000.0000 [IU] | ORAL_TABLET | Freq: Every day | ORAL | Status: DC
  Administered 2021-04-25 – 2021-05-01 (×7): 2000 [IU] via ORAL
  Filled 2021-04-24 (×7): qty 2

## 2021-04-24 MED ORDER — IOHEXOL 350 MG/ML SOLN
75.0000 mL | Freq: Once | INTRAVENOUS | Status: AC | PRN
  Administered 2021-04-24: 75 mL via INTRAVENOUS

## 2021-04-24 MED ORDER — ASPIRIN EC 81 MG PO TBEC
81.0000 mg | DELAYED_RELEASE_TABLET | Freq: Every day | ORAL | Status: DC
  Administered 2021-04-27 – 2021-05-01 (×5): 81 mg via ORAL
  Filled 2021-04-24 (×5): qty 1

## 2021-04-24 MED ORDER — MAGNESIUM HYDROXIDE 400 MG/5ML PO SUSP: 30.0000 mL | Freq: Every day | ORAL | Status: DC | PRN

## 2021-04-24 NOTE — ED Triage Notes (Signed)
Pt is coming from [facility] and has had multiple falls, he is having left hip pain, right rib pain and contusion above his right eye. VS 178/108, HR 100, resp 16 96% RA

## 2021-04-24 NOTE — ED Notes (Signed)
Assumed care of patient. Patient is alert but confused. Upon entering room patient states, "Come here and give me a kiss." Patient able to state first name and that he is in [place], but does not know he is in hospital or able to recall correct year. Follows commands to move extremities and smile. Resp even, unlabored on RA. Paced rhythm on monitor. Hematoma noted to right face. No acute distress noted at this time.

## 2021-04-24 NOTE — ED Notes (Signed)
Patient transported to CT 

## 2021-04-24 NOTE — ED Notes (Signed)
Awaiting esmolol from pharmacy. Pharmacist states med has been sent, but is not yet received in ED.

## 2021-04-24 NOTE — ED Notes (Signed)
Patient reports new severe lower abdomen pain. Dr. Archie Balboa notified.

## 2021-04-24 NOTE — ED Notes (Signed)
Pt educated on need for UA and when entering room it smells of a strong urine odor, this RN and another RN attempted to change pt's brief and then pt started guarding his peri area stating "don't fucking touch me" "get away from me". In & Out UA was also needing to be obtained due to incontinence and pt being confused and hallucinating. This RN attempted to explain the process and need for UA and that he shouldn't be sitting in a wet brief, pt still not allowing this RN at this time to do and in and out cath or change wet brief.

## 2021-04-24 NOTE — ED Notes (Signed)
Patient hallucinating. States he sees men at the doorway. Patient looking up in the air stating "Mommy!" Patient increasingly agitated.

## 2021-04-24 NOTE — ED Provider Notes (Signed)
Emergency Medicine Provider Triage Evaluation Note  James Dickerson, a 80 y.o. male  was evaluated in triage.  Pt complains of presents to the ED via EMS from his assisted living facility, with complaints of multiple unwitnessed fall.  Patient complains of left hip pain and right rib pain, and has a bleeding contusion to the right cheek.  Patient has audible expiratory wheeze in triage.  Cording to chart review patient has submental O2 that he uses as needed mostly at bedtime.  Review of Systems  Positive: Weakness, falls Negative: FCS  Physical Exam  BP (!) 205/113 (BP Location: Left Arm) Comment: checked 2x  Pulse (!) 103   Temp 98 F (36.7 C) (Oral)   Resp (!) 22   Ht 5\' 3"  (1.6 m)   Wt 63.5 kg   SpO2 90%   BMI 24.80 kg/m  Gen:   Awake, no distress  NAD Resp:  Normal effort expiratory wheeze noted MSK:   Moves extremities without difficulty c/o L hip pain, right knee contusion Other:  CVS: tachy rate  Medical Decision Making  Medically screening exam initiated at 1:48 PM.  Appropriate orders placed.  Mihail Prettyman was informed that the remainder of the evaluation will be completed by another provider, this initial triage assessment does not replace that evaluation, and the importance of remaining in the ED until their evaluation is complete.  Geriatric patient presenting to the ED from his facility with complaints of multiple unwitnessed falls.   Melvenia Needles, PA-C 04/24/21 1354    Lucrezia Starch, MD 04/24/21 [phone]

## 2021-04-24 NOTE — H&P (Addendum)
[place]   PATIENT NAME: James Dickerson    MR#:  [id number]  DATE OF BIRTH:  [birth year]  DATE OF ADMISSION:  04/24/2021  PRIMARY CARE PHYSICIAN: Pcp, No   Patient is coming from: Assisted living facility.  REQUESTING/REFERRING PHYSICIAN: Nance Pear, MD  CHIEF COMPLAINT:   Chief Complaint  Patient presents with  . Fall    HISTORY OF PRESENT ILLNESS:  Colden Samaras is a 80 y.o. African-American male with medical history significant for COPD, depression, GERD, dyslipidemia and hypertension, who presented to the ER with acute onset of altered mental status with recent recurrent falls.  He had a subsequent contusion to his right cheek.  He was complaining in triage from chest pain as well as hip pain.  The patient is a poor historian due to his altered mental status.  ED Course: Upon presentation to the ER, blood pressure was 205/113 with a heart rate of 103 and respiratory rate of 22 approximately was 90 to 91% on room air.  Labs revealed mild hypokalemia of 3.2 with otherwise unremarkable BMP.  High-sensitivity troponin I was 48 and CBC showed mild anemia close to baseline.  Influenza antigens and COVID-19 PCR came back negative.  UA showed 11-20 WBCs with high specific gravity and moderate leukocytes with 5 ketones.. EKG as reviewed by me : EKG showed atrial sensed ventricular paced rhythm with a rate of 101 Imaging: 2 view chest x-ray showed no acute posttraumatic deformity identified.  It showed known thoracic aortic aneurysm as seen on 11/07/2020 CT.  There was a question of increased soft tissue fullness in this region since prior plain film of 07/22/2020.    Chest and abdomen CTA then showed the following: 1. 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 This increased soft tissue is consistent with an enlarging mural hematoma. A small amount arterial contrast extends into this presumed hematoma,  just inferior to the saccular portion of the aneurysm. 2. Other findings of the thoracoabdominal aorta, including the excluded infrarenal abdominal aortic aneurysm, are without significant change from the prior CTA.   NON CTA FINDINGS   1. Recent, mild compression fracture of T12, not present on the prior CTA. Patient's chest radiographic history reports multiple falls and right rib pain. T12 fracture is therefore suspected to be acute. 2. Recent/acute fractures of the right lateral fifth and sixth ribs. There may be other rib fractures, but this assessment is limited by respiratory motion. 3. No other nonvascular acute abnormalities within the chest, abdomen or pelvis.  Noncontrasted head CT scan, maxillofacial CT, C-spine CT and hip x-ray showed no acute findings as described below.  Dr. Lucky Cowboy was consulted about the patient and recommended conservative management with BP control.  The patient was given a gram of IV Rocephin and was started on IV esmolol drip.  He will be admitted to a stepdown unit bed for further evaluation and management. PAST MEDICAL HISTORY:   Past Medical History:  Diagnosis Date  . Back pain   . Cardiomegaly   . COPD (chronic obstructive pulmonary disease) ([place])   . Cystic kidney disease   . Depressive disorder   . GERD (gastroesophageal reflux disease)   . High cholesterol   . History of prostate cancer   . Hypertension   . Prostate cancer ([place]) 2018   Rad seeds  . Thyroid nodule     PAST SURGICAL HISTORY:   Past Surgical History:  Procedure Laterality Date  . ABDOMINAL AORTIC ENDOVASCULAR STENT GRAFT N/A 06/22/2020   Procedure: ABDOMINAL AORTIC ENDOVASCULAR STENT GRAFT;  Surgeon: Cherre Robins, MD;  Location: [facility];  Service: Vascular;  Laterality: N/A;  . APPENDECTOMY  1971  . CYSTOSCOPY WITH URETHRAL DILATATION N/A 06/22/2020   Procedure: CYSTOSCOPY WITH URETHRAL DILATATION;  Surgeon: Alexis Frock, MD;  Location: [facility];  Service: Urology;   Laterality: N/A;  . INSERTION OF ILIAC STENT Right 06/22/2020   Procedure: INSERTION OF RIGHT EXTERNAL ILIAC ARTERY STENT;  Surgeon: Cherre Robins, MD;  Location: [facility];  Service: Vascular;  Laterality: Right;  . INSERTION OF SUPRAPUBIC CATHETER N/A 06/22/2020   Procedure: INSERTION OF SUPRAPUBIC CATHETER;  Surgeon: Alexis Frock, MD;  Location: [facility];  Service: Urology;  Laterality: N/A;  . PACEMAKER INSERTION N/A 08/06/2016   Procedure: INSERTION PACEMAKER;  Surgeon: Isaias Cowman, MD;  Location: [facility];  Service: Cardiovascular;  Laterality: N/A;    SOCIAL HISTORY:   Social History   Tobacco Use  . Smoking status: Every Day    Packs/day: 0.25    Years: 67.00    Pack years: 16.75    Types: Cigarettes  . Smokeless tobacco: Never  . Tobacco comments:    3 every other day  Substance Use Topics  . Alcohol use: No    FAMILY HISTORY:  History reviewed. No pertinent family history.  DRUG ALLERGIES:  No Known Allergies  REVIEW OF SYSTEMS:   ROS As per history of present illness otherwise unobtainable due to altered mental status with associated dementia.  MEDICATIONS AT HOME:   Prior to Admission medications   Medication Sig Start Date End Date Taking? Authorizing Provider  albuterol (PROVENTIL) (2.5 MG/3ML) 0.083% nebulizer solution Take 2.5 mg by nebulization every 6 (six) hours as needed. 07/15/20  Yes [provider]  aspirin EC 81 MG EC tablet Take 1 tablet (81 mg total) by mouth daily at 6 (six) AM. Swallow whole. 06/29/20  Yes Setzer, Edman Circle, PA-C  atorvastatin (LIPITOR) 40 MG tablet Take 1 tablet (40 mg total) by mouth at bedtime. 06/17/18  Yes Mayo, Pete Pelt, MD  Cholecalciferol (VITAMIN D3) 50 MCG (2000 UT) CHEW Chew 2,000 Units by mouth daily.   Yes [provider]  donepezil (ARICEPT) 10 MG tablet Take 10 mg by mouth daily. 04/03/21  Yes [provider]  ipratropium-albuterol (DUONEB) 0.5-2.5 (3) MG/3ML SOLN Inhale 3 mLs into  the lungs every 6 (six) hours as needed (Wheezing). 01/21/20  Yes [provider]  megestrol (MEGACE) 40 MG tablet Take 40 mg by mouth daily. 04/03/21  Yes [provider]  mirabegron ER (MYRBETRIQ) 25 MG TB24 tablet Take 1 tablet (25 mg total) by mouth daily. 07/27/20  Yes Jennye Boroughs, MD  OXYGEN Inhale 2-3 L into the lungs as needed (at bedtime).   Yes [provider]  pantoprazole (PROTONIX) 40 MG tablet Take 40 mg by mouth 2 (two) times daily. 10/18/19  Yes [provider]  tamsulosin (FLOMAX) 0.4 MG CAPS capsule Take 0.4 mg by mouth daily. 10/14/19  Yes [provider]  valsartan (DIOVAN) 160 MG tablet Take 160 mg by mouth daily. 05/04/18  Yes [provider]  amLODipine (NORVASC) 5 MG tablet Take 5 mg by mouth every morning. Patient not taking: Reported on 04/24/2021 09/20/19   [provider]  budesonide-formoterol (SYMBICORT) 160-4.5 MCG/ACT inhaler Inhale 2 puffs into the lungs 2 (two) times daily. 02/13/18   Merlyn Lot, MD  VITAL SIGNS:  Blood pressure (!) 180/126, pulse 94, temperature 98 F (36.7 C), temperature source Oral, resp. rate (!) 27, height 5\' 3"  (1.6 m), weight 63.5 kg, SpO2 100 %.  PHYSICAL EXAMINATION:  Physical Exam  GENERAL:  80 y.o.-year-old African-American male patient lying in the bed with no acute distress.  He was mildly restless in bed but moving all 4 extremities. EYES: Pupils equal, round, reactive to light and accommodation. No scleral icterus. Extraocular muscles intact.  HEENT: Head  normocephalic with right infraorbital contusion. Oropharynx and nasopharynx clear.  NECK:  Supple, no jugular venous distention. No thyroid enlargement, no tenderness.  LUNGS: Normal breath sounds bilaterally, no wheezing, rales,rhonchi or crepitation. No use of accessory muscles of respiration.  CARDIOVASCULAR: Regular rate and rhythm, S1, S2 normal. No murmurs, rubs, or gallops.  Right-sided lateral  chest wall tenderness around fifth and sixth ribs. ABDOMEN: Soft, nondistended, nontender. Bowel sounds present. No organomegaly or mass.  EXTREMITIES: No pedal edema, cyanosis, or clubbing.  NEUROLOGIC: Cranial nerves II through XII are intact. Muscle strength 5/5 in all extremities. Sensation intact. Gait not checked.  PSYCHIATRIC: The patient is alert and oriented x 3.  Normal affect and good eye contact. SKIN: No obvious rash, lesion, or ulcer.  Musculoskeletal: Right anterior proximal tibial below-knee contusion.      LABORATORY PANEL:   CBC Recent Labs  Lab 04/24/21 1341  WBC 8.7  HGB 12.5*  HCT 38.0*  PLT 154   ------------------------------------------------------------------------------------------------------------------  Chemistries  Recent Labs  Lab 04/24/21 1341  NA 135  K 3.2*  CL 101  CO2 26  GLUCOSE 117*  BUN 21  CREATININE 1.19  CALCIUM 9.2   ------------------------------------------------------------------------------------------------------------------  Cardiac Enzymes No results for input(s): TROPONINI in the last 168 hours. ------------------------------------------------------------------------------------------------------------------  RADIOLOGY:  DG Chest 2 View  Result Date: 04/24/2021 CLINICAL DATA:  Multiple falls.  Right rib pain. EXAM: CHEST - 2 VIEW COMPARISON:  07/22/2020 FINDINGS: Pacer with leads at right atrium and right ventricle. No lead discontinuity. Remote right rib fractures. Bilateral remote rib fractures. No acute displaced fracture identified. Midline trachea. Normal heart size. Transverse aortic dilatation was detailed on the CTA of 11/08/2020. Question increase in soft tissue fullness since prior plain film of 07/22/2020. No pleural effusion or pneumothorax. Scarring in the medial left lower lobe. IMPRESSION: No acute posttraumatic deformity identified. Known thoracic aortic aneurysm, as on 11/07/2020 CT. Question increased  soft tissue fullness in this region since prior plain film of 07/22/2020. If suspicion of acute complication, consider repeat CTA. Electronically Signed   By: Abigail Miyamoto M.D.   On: 04/24/2021 14:55   CT HEAD WO CONTRAST  Result Date: 04/24/2021 CLINICAL DATA:  Head trauma EXAM: CT HEAD WITHOUT CONTRAST TECHNIQUE: Contiguous axial images were obtained from the base of the skull through the vertex without intravenous contrast. COMPARISON:  CT head 04/04/2021 FINDINGS: Brain: No acute intracranial hemorrhage, mass effect, or herniation. No extra-axial fluid collections. No evidence of acute territorial infarct. No hydrocephalus. Extensive hypodensities throughout the periventricular and subcortical white matter, likely secondary to advanced chronic microvascular ischemic changes. 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 Vascular: No hyperdense vessel or unexpected calcification. Skull: Normal. Negative for fracture or focal lesion. Sinuses/Orbits: Mild mucosal thickening in the paranasal sinuses and mild opacification of the mastoid air cells. Other: None. IMPRESSION: No acute intracranial process identified. Multiple chronic findings as described. Electronically Signed   By: Ofilia Neas M.D.   On: 04/24/2021 14:49  CT Cervical Spine Wo Contrast  Result Date: 04/24/2021 CLINICAL DATA:  Trauma EXAM: CT CERVICAL SPINE WITHOUT CONTRAST TECHNIQUE: Multidetector CT imaging of the cervical spine was performed without intravenous contrast. Multiplanar CT image reconstructions were also generated. COMPARISON:  None. FINDINGS: Alignment: Grade 1 anterolisthesis of C2 on C3. Skull base and vertebrae: No acute fracture. No primary bone lesion or focal pathologic process. Soft tissues and spinal canal: No prevertebral fluid or swelling. No visible canal hematoma. Disc levels: Moderate to severe intervertebral disc height loss at multiple levels from C3 through T1. Associated endplate  sclerosis, cysts, uncovertebral spurring and small dorsal endplate osteophytes. Mild facet arthropathy bilaterally. Multilevel neural foraminal narrowing most significant at C5-C6 bilaterally. Likely multilevel mild-to-moderate spinal canal stenoses. Upper chest: Emphysematous changes in the visualized upper lungs. Other: None. IMPRESSION: No acute fracture or subluxation identified. Advanced degenerative changes. Electronically Signed   By: Ofilia Neas M.D.   On: 04/24/2021 14:52   CT Angio Chest/Abd/Pel for Dissection W and/or Wo Contrast  Result Date: 04/24/2021 CLINICAL DATA:  Falls. Chest radiograph suggests increased soft tissue in the region of the known thoracic aortic aneurysm. EXAM: CT ANGIOGRAPHY CHEST, ABDOMEN AND PELVIS TECHNIQUE: Non-contrast CT of the chest was initially obtained. Multidetector CT imaging through the chest, abdomen and pelvis was performed using the standard protocol during bolus administration of intravenous contrast. Multiplanar reconstructed images and MIPs were obtained and reviewed to evaluate the vascular anatomy. CONTRAST:  61mL OMNIPAQUE IOHEXOL 350 MG/ML SOLN COMPARISON:  Current chest radiograph. Chest, abdomen and pelvis CTA, 11/07/2020. FINDINGS: CTA CHEST FINDINGS Cardiovascular: As suggested on the current chest radiograph, there has been a change in the appearance of the aneurysm of the aortic arch. At the level of the focal bulge of the aortic arch, just distal to the left subclavian artery origin, there is now increased soft tissue attenuation that is contiguous with the wall, average Hounsfield units 22. Also, there is contrast that extends into this soft tissue at the level of the focal aortic bulge, consistent with arterial blood dissecting into the mural thrombus/mural hematoma. This soft tissue measures 1.8 cm in thickness along the lateral wall the distal arch, measuring only a few mm on the prior CT, and measures approximally 5.4 x 3.3 cm inferior to  the focal bulge, where it measured approximally 3.6 x 2.7 cm previously. This is suspected to be mural hemorrhage has increased from prior exam. The ascending thoracic aorta measures 3.2 cm in diameter. At the level of the aneurysm of the distal arch the lumen now measures 3.8 cm, 3.6 cm previously. At the level of the inferior focal bulge of the distal arch, on the sagittal reconstructed images, the lumen measures 4 cm, previously 3.9 cm. Overall thoracic aorta at the level of the saccular aneurysm, including the wall and hematoma now measures 5.5 cm on the sagittal reconstructed image. The descending thoracic aorta, from wall to wall, measures 4.4 cm, above the level of the hiatus, without significant change. No aortic dissection. Heart is normal in size and configuration.  No pericardial effusion. Mediastinum/Nodes: No neck base, mediastinal or hilar masses or enlarged lymph nodes. Trachea and esophagus are unremarkable. Lungs/Pleura: Mild linear atelectasis and/or scarring at the lung bases. Centrilobular emphysema. No evidence of pneumonia or pulmonary edema. No pleural effusion or pneumothorax. No significant change. Musculoskeletal: Mild compression fracture of T12, new since the prior CT. There is slight posterior contour bulging of the vertebra, but no retropulsed fragment or significant encroachment upon the spinal.  There are recent fractures of the lateral right fifth and sixth ribs. There may be additional fractures, with this assessment limited by motion. No other fractures.  No bone lesions. Review of the MIP images confirms the above findings. CTA ABDOMEN AND PELVIS FINDINGS VASCULAR Aorta: Previous exclusion of an abdominal aortic aneurysm with a stent graft, unchanged compared to the prior CT native aneurysm measuring 4.1 cm anterior-posterior by 5 cm transversely. Aortic lumen remains widely patent. Celiac: Patent without evidence of aneurysm, dissection, vasculitis or significant stenosis. SMA:  Patent without evidence of aneurysm, dissection, vasculitis or significant stenosis. Renals: Images are degraded by patient motion. No convincing significant renal artery stenosis or change from the prior CTA. IMA: Excluded at its origin with collateral flow again noted, unchanged from the prior CTA. Inflow: Iliac artery stent grafts are widely patent. Veins: No obvious venous abnormality within the limitations of this arterial phase study. Review of the MIP images confirms the above findings. NON-VASCULAR Hepatobiliary: No focal liver abnormality is seen. No gallstones, gallbladder wall thickening, or biliary dilatation. Pancreas: Unremarkable. No pancreatic ductal dilatation or surrounding inflammatory changes. Spleen: Normal in size without focal abnormality. Adrenals/Urinary Tract: No adrenal masses. Left renal cyst. Small nonobstructing stone in the lower pole the right kidney suggested. No hydronephrosis. Ureters not well visualized, but no evidence of ureteral dilation or stone. Bladder is unremarkable. Stomach/Bowel: Bowel assessment somewhat limited by motion. Stomach is unremarkable. Small bowel and colon are normal in caliber. No wall thickening convincing inflammation. Lymphatic: No enlarged lymph nodes. Reproductive: Unremarkable. Other: No abdominal wall hernia.  No ascites. Musculoskeletal: No fracture or bone lesion. Review of the MIP images confirms the above findings. IMPRESSION: CTA FINDINGS 1. 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 This increased soft tissue is consistent with an enlarging mural hematoma. A small amount arterial contrast extends into this presumed hematoma, just inferior to the saccular portion of the aneurysm. 2. Other findings of the thoracoabdominal aorta, including the excluded infrarenal abdominal aortic aneurysm, are without significant change from the prior CTA. NON CTA FINDINGS 1. Recent, mild  compression fracture of T12, not present on the prior CTA. Patient's chest radiographic history reports multiple falls and right rib pain. T12 fracture is therefore suspected to be acute. 2. Recent/acute fractures of the right lateral fifth and sixth ribs. There may be other rib fractures, but this assessment is limited by respiratory motion. 3. No other nonvascular acute abnormalities within the chest, abdomen or pelvis. Electronically Signed   By: Lajean Manes M.D.   On: 04/24/2021 17:54   DG HIP UNILAT WITH PELVIS 2-3 VIEWS LEFT  Result Date: 04/24/2021 CLINICAL DATA:  Fall, left hip pain EXAM: DG HIP (WITH OR WITHOUT PELVIS) 2-3V LEFT COMPARISON:  CT abdomen/pelvis 07/23/2020 FINDINGS: There is no acute fracture or dislocation. Femoroacetabular alignment is maintained. The SI joints and symphysis pubis are intact. Vascular stents are noted. IMPRESSION: No acute fracture or dislocation. Electronically Signed   By: Valetta Mole M.D.   On: 04/24/2021 14:54   CT Maxillofacial Wo Contrast  Result Date: 04/24/2021 CLINICAL DATA:  Facial trauma EXAM: CT MAXILLOFACIAL WITHOUT CONTRAST TECHNIQUE: Multidetector CT imaging of the maxillofacial structures was performed. Multiplanar CT image reconstructions were also generated. COMPARISON:  CT maxillofacial 04/04/2021 FINDINGS: Osseous: No acute fracture or dislocation identified, given limitations of motion. No significant change since previous study visualized. Orbits: No acute process. Sinuses: No air-fluid levels. Mild chronic appearing  mucosal thickening/mucous retention cysts in the ethmoid and left maxillary sinus. Soft tissues: Right maxillary soft tissue swelling/hematoma. Limited intracranial: No acute abnormality visualized. IMPRESSION: 1. No acute fracture identified. 2. Right infraorbital maxillary soft tissue swelling/hematoma. Electronically Signed   By: Ofilia Neas M.D.   On: 04/24/2021 14:56      IMPRESSION AND PLAN:  Principal Problem:    Altered mental status  1.  Altered mental status with recent recurrent falls in the setting of UTI it could be contributing to his metabolic encephalopathy.  The patient has subsequent mild sepsis as manifested by tachycardia and tachypnea. - The patient will be admitted to a stepdown unit bed. - We will place on IV Rocephin. - We will follow urine and blood cultures. - We will follow lactic acid level.  2.  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 - Vascular surgery consult to be obtained. - Dr. Lucky Cowboy was notified and is aware about the patient. - Optimal BP control will be performed in the setting  3.  Hypertensive urgency. - We will continue the patient on IV esmolol drip. - We will continue his p.o. amlodipine and Diovan.  4.  Recurrent fall with subsequent right infraorbital and maxillary soft tissues hematoma, mild T12 compression fraction and right lateral fifth and sixth rib fractures.  He also has right proximal anterior leg contusion. - Pain management will be provided. - Physical therapy consult to be obtained to assist with ambulation and need for SNF for further PT and rehabilitation. - We will obtain an x-ray of the right tib-fib.  5.  Hypokalemia. - Potassium will be replaced and magnesium level will be checked.  6.  Dyslipidemia. - We will continue statin therapy.  7.  Dementia. - We will continue Aricept.  8.  Overactive bladder. - We will continue Myrbetriq.  9.  BPH. - We will continue Flomax.  10.  GERD. - We will continue PPI therapy.  11.  COPD without exacerbation. - We will continue his duo nebs and as needed   DVT prophylaxis: SCDs.  Medical prophylaxis currently held off given his third aortic aneurysm mural hematoma. Code Status: full code. Family Communication:  The plan of care was discussed in details with the patient (and family). I answered all questions. The patient agreed to proceed with the above mentioned plan.  Further management will depend upon hospital course. Disposition Plan: Back to previous home environment Consults called: Vascular surgery consult.   All the records are reviewed and case discussed with ED provider.  Status is: Inpatient   Remains inpatient appropriate because:Ongoing diagnostic testing needed not appropriate for outpatient work up, Unsafe d/c plan, IV treatments appropriate due to intensity of illness or inability to take PO, and Inpatient level of care appropriate due to severity of illness   Dispo: The patient is from: Home              Anticipated d/c is to: Home              Patient currently is not medically stable to d/c.              Difficult to place patient: No  TOTAL TIME TAKING CARE OF THIS PATIENT: 55 minutes.     Christel Mormon M.D on 04/24/2021 at 9:59 PM  [facility]   From 7 PM-7 AM, contact night-coverage [email or web address]  CC: Primary care physician; Pcp, No

## 2021-04-24 NOTE — ED Notes (Signed)
Pt is disoriented X4. Pt is unable to answer questions. Per triage note, pt visit for a fall that happened today. Unknown if pt is on any blood thinners.

## 2021-04-24 NOTE — ED Notes (Signed)
Pt did allow this RN and another RN to change his brief and bed linen as well as apply a primofit for obtaining a UA due to pt not allowing the in and out at this time.

## 2021-04-24 NOTE — ED Provider Notes (Signed)
[facility] Emergency Department Provider Note  ____________________________________________   I have reviewed the triage vital signs and the nursing notes.   HISTORY  Chief Complaint Fall   History limited by and level 5 caveat due to:  Altered Mental Status   HPI James Dickerson is a 80 y.o. male who presents to the emergency department today apparently for multiple falls.  Patient himself cannot give any significant history as to why he is here.  It does appear that he has had a fall with some bruising to his right cheek.  Apparently in triage she was complaining of some hip and chest pain.   Records reviewed. Per medical record review patient has a history of COPD  Past Medical History:  Diagnosis Date   Back pain    Cardiomegaly    COPD (chronic obstructive pulmonary disease) (HCC)    Cystic kidney disease    Depressive disorder    GERD (gastroesophageal reflux disease)    High cholesterol    History of prostate cancer    Hypertension    Prostate cancer ([place]) 2018   Rad seeds   Thyroid nodule     Patient Active Problem List   Diagnosis Date Noted   Protein-calorie malnutrition, severe 07/24/2020   Hematuria 07/23/2020   UTI (urinary tract infection) 07/22/2020   Foley catheter in place 07/22/2020   GERD (gastroesophageal reflux disease) 07/22/2020   Elevated lactic acid level 07/22/2020   Gross hematuria 07/22/2020   AAA (abdominal aortic aneurysm) 06/23/2020   Status post surgery 06/22/2020   Thoracic aortic aneurysm without rupture 12/18/2019   Respiratory failure ([place]) 07/11/2017   Acute respiratory failure ([place]) 07/10/2017   CAP (community acquired pneumonia) 07/10/2017   Influenza A 07/10/2017   COPD (chronic obstructive pulmonary disease) ([place]) 07/10/2017   HLD (hyperlipidemia) 03/09/2017   Essential hypertension 08/14/2016   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   Bradycardia 08/05/2016    Past Surgical History:   Procedure Laterality Date   ABDOMINAL AORTIC ENDOVASCULAR STENT GRAFT N/A 06/22/2020   Procedure: ABDOMINAL AORTIC ENDOVASCULAR STENT GRAFT;  Surgeon: Cherre Robins, MD;  Location: [facility];  Service: Vascular;  Laterality: N/A;   [place] N/A 06/22/2020   Procedure: CYSTOSCOPY WITH URETHRAL DILATATION;  Surgeon: Alexis Frock, MD;  Location: [place];  Service: Urology;  Laterality: N/A;   INSERTION OF ILIAC STENT Right 06/22/2020   Procedure: INSERTION OF RIGHT EXTERNAL ILIAC ARTERY STENT;  Surgeon: Cherre Robins, MD;  Location: [place];  Service: Vascular;  Laterality: Right;   INSERTION OF SUPRAPUBIC CATHETER N/A 06/22/2020   Procedure: INSERTION OF SUPRAPUBIC CATHETER;  Surgeon: Alexis Frock, MD;  Location: [place];  Service: Urology;  Laterality: N/A;   PACEMAKER INSERTION N/A 08/06/2016   Procedure: INSERTION PACEMAKER;  Surgeon: Isaias Cowman, MD;  Location: [facility];  Service: Cardiovascular;  Laterality: N/A;    Prior to Admission medications   Medication Sig Start Date End Date Taking? Authorizing Provider  albuterol (PROVENTIL) (2.5 MG/3ML) 0.083% nebulizer solution Take 2.5 mg by nebulization every 6 (six) hours as needed. 07/15/20   [provider]  amLODipine (NORVASC) 5 MG tablet Take 5 mg by mouth every morning. 09/20/19   [provider]  aspirin EC 81 MG EC tablet Take 1 tablet (81 mg total) by mouth daily at 6 (six) AM. Swallow whole. 06/29/20   Setzer, Edman Circle, PA-C  atorvastatin (LIPITOR) 40 MG tablet Take 1  tablet (40 mg total) by mouth at bedtime. 06/17/18   Mayo, Pete Pelt, MD  budesonide-formoterol [facility]) 160-4.5 MCG/ACT inhaler Inhale 2 puffs into the lungs 2 (two) times daily. 02/13/18   Merlyn Lot, MD  Cholecalciferol (VITAMIN D3) 50 MCG (2000 UT) CHEW Chew 2,000 Units by mouth daily.    [provider]  donepezil (ARICEPT) 5 MG tablet Take 5 mg by mouth at bedtime.    [provider]  ipratropium-albuterol (DUONEB) 0.5-2.5 (3) MG/3ML SOLN Inhale 3 mLs into the lungs every 6 (six) hours as needed (Wheezing). 01/21/20   [provider]  megestrol (MEGACE) 20 MG tablet Take 20 mg by mouth 2 (two) times daily. 06/06/20   [provider]  mirabegron ER (MYRBETRIQ) 25 MG TB24 tablet Take 1 tablet (25 mg total) by mouth daily. 07/27/20   Jennye Boroughs, MD  OXYGEN Inhale 2-3 L into the lungs as needed (at bedtime).    [provider]  pantoprazole (PROTONIX) 40 MG tablet Take 40 mg by mouth 2 (two) times daily. 10/18/19   [provider]  tamsulosin (FLOMAX) 0.4 MG CAPS capsule Take 0.4 mg by mouth daily. 10/14/19   [provider]  valsartan (DIOVAN) 160 MG tablet Take 160 mg by mouth daily. 05/04/18   [provider]    Allergies Patient has no known allergies.  History reviewed. No pertinent family history.  Social History Social History   Tobacco Use   Smoking status: Every Day    Packs/day: 0.25    Years: 67.00    Pack years: 16.75    Types: Cigarettes   Smokeless tobacco: Never   Tobacco comments:    3 every other day  Vaping Use   Vaping Use: Never used  Substance Use Topics   Alcohol use: No   Drug use: No    Review of Systems Unable to obtain secondary to altered mental status.   ____________________________________________   PHYSICAL EXAM:  VITAL SIGNS: ED Triage Vitals  Enc Vitals Group     BP 04/24/21 1344 (!) 205/113     Pulse Rate 04/24/21 1344 (!) 103     Resp 04/24/21 1344 (!) 22     Temp 04/24/21 1344 98 F (36.7 C)     Temp Source 04/24/21 1344 Oral     SpO2 04/24/21 1344 90 %     Weight 04/24/21 1338 139 lb 15.9 oz (63.5 kg)     Height 04/24/21 1338 5\' 3"  (1.6 m)    Constitutional: Awake and alert.  Eyes: Conjunctivae are normal.  ENT      Head: Normocephalic and atraumatic.      Nose: No congestion/rhinnorhea.      Mouth/Throat: Mucous membranes are moist.       Neck: No stridor. Hematological/Lymphatic/Immunilogical: No cervical lymphadenopathy. Cardiovascular: Normal rate, regular rhythm.  No murmurs, rubs, or gallops.  Respiratory: Normal respiratory effort without tachypnea nor retractions. Breath sounds are clear and equal bilaterally. No wheezes/rales/rhonchi. Gastrointestinal: Soft and non tender. No rebound. No guarding.  Genitourinary: Deferred Musculoskeletal: Normal range of motion in all extremities. No lower extremity edema. Neurologic:  Not oriented. Moving all extremities.  Skin:  Skin is warm, dry and intact. No rash noted. Psychiatric: Appears to be having some hallucinations, pointing at people that aren't in the room. ____________________________________________    LABS (pertinent positives/negatives)  BMP na 135, k 3.2, glu 117, cr 1.19 CBC wbc 8.7, hgb 12.5, plt 154 UA moderate leukocytes, 11-20 WBC Trop hs 48  ____________________________________________   EKG  INance Pear, attending physician, personally viewed and interpreted this EKG  EKG Time: 1346 Rate: 101 Rhythm: atrial sensed v paced rhythm Axis: left axis deviation Intervals: qtc 549 QRS: wide ST changes: no st elevation Impression: abnormal ekg  ____________________________________________    RADIOLOGY  CT head No acute abnormality  CT cervical spine No acute fracture/subluxation  CT max/face No acute fracture  Left hip x-ray No acute fracture or dislocation  CXR No acute posttraumatic deformity. Thoracic aneurysm with increased soft tissue fullness  CT angio Expanding mural hematoma with some arterial extravasation in thoracic aneurysm.   ____________________________________________   PROCEDURES  Procedures  CRITICAL CARE Performed by: Nance Pear   Total critical care time: 30 minutes  Critical care time was exclusive of separately billable procedures and treating other patients.  Critical care was necessary  to treat or prevent imminent or life-threatening deterioration.  Critical care was time spent personally by me on the following activities: development of treatment plan with patient and/or surrogate as well as nursing, discussions with consultants, evaluation of patient's response to treatment, examination of patient, obtaining history from patient or surrogate, ordering and performing treatments and interventions, ordering and review of laboratory studies, ordering and review of radiographic studies, pulse oximetry and re-evaluation of patient's condition.  ____________________________________________   INITIAL IMPRESSION / ASSESSMENT AND PLAN / ED COURSE  Pertinent labs & imaging results that were available during my care of the patient were reviewed by me and considered in my medical decision making (see chart for details).   Patient presented to the emergency department today with concerns for increased falls.  Also complaining of some chest and hip pain.  X-rays without any acute osseous abnormality.  Chest x-ray was concerning for possible increased soft tissue fullness around his thoracic aneurysm.  Because of this CT angio was obtained.  This was consistent with expanding mural hematoma with some arterial extravasation.  Discussed with Dr. Lucky Cowboy with vascular surgery.  This time will plan blood pressure control.  Additionally do have concerns for possible urinary tract infection.  I do think this could be causing some of the weakness and altered mental status.  Will start IV antibiotics.  Will plan on admission.  ____________________________________________   FINAL CLINICAL IMPRESSION(S) / ED DIAGNOSES  Final diagnoses:  Hip pain  Lower urinary tract infectious disease  Thoracic aortic aneurysm (TAA), unspecified part, unspecified whether ruptured     Note: This dictation was prepared with Dragon dictation. Any transcriptional errors that result from this process are  unintentional     Nance Pear, MD 04/24/21 2139

## 2021-04-24 NOTE — ED Triage Notes (Signed)
Pt comes into the ED via [facility] from [facility] c/o multiple falls.  Falls have been unwitnessed.  Pt c/o left hip, right ribs, and contusion on the right cheek.  Unknown if patient is on any blood thinners.  Pt currently has even and unlabored respirations, but wheezing is heard in triage.

## 2021-04-25 DIAGNOSIS — T148XXA Other injury of unspecified body region, initial encounter: Secondary | ICD-10-CM

## 2021-04-25 DIAGNOSIS — S0011XA Contusion of right eyelid and periocular area, initial encounter: Secondary | ICD-10-CM

## 2021-04-25 DIAGNOSIS — R4182 Altered mental status, unspecified: Secondary | ICD-10-CM

## 2021-04-25 LAB — TROPONIN I (HIGH SENSITIVITY)
Troponin I (High Sensitivity): 77 ng/L — ABNORMAL HIGH (ref <18)
Troponin I (High Sensitivity): 89 ng/L — ABNORMAL HIGH (ref <18)
Troponin I (High Sensitivity): 90 ng/L — ABNORMAL HIGH (ref <18)
Troponin I (High Sensitivity): 83 ng/L — ABNORMAL HIGH (ref <18)

## 2021-04-25 LAB — CBC
HCT: 39.2 % (ref 39.0–52.0)
Hemoglobin: 12.8 g/dL — ABNORMAL LOW (ref 13.0–17.0)
MCH: 30.6 pg (ref 26.0–34.0)
MCHC: 32.7 g/dL (ref 30.0–36.0)
MCV: 93.8 fL (ref 80.0–100.0)
Platelets: 145 10*3/uL — ABNORMAL LOW (ref 150–400)
RBC: 4.18 MIL/uL — ABNORMAL LOW (ref 4.22–5.81)
RDW: 13.3 % (ref 11.5–15.5)
WBC: 8.8 10*3/uL (ref 4.0–10.5)
nRBC: 0 % (ref 0.0–0.2)

## 2021-04-25 LAB — BASIC METABOLIC PANEL
Anion gap: 10 (ref 5–15)
BUN: 23 mg/dL (ref 8–23)
CO2: 22 mmol/L (ref 22–32)
Calcium: 9.2 mg/dL (ref 8.9–10.3)
Chloride: 104 mmol/L (ref 98–111)
Creatinine, Ser: 1.13 mg/dL (ref 0.61–1.24)
GFR, Estimated: >60 mL/min (ref >60)
Glucose, Bld: 113 mg/dL — ABNORMAL HIGH (ref 70–99)
Potassium: 3.6 mmol/L (ref 3.5–5.1)
Sodium: 136 mmol/L (ref 135–145)

## 2021-04-25 LAB — LACTIC ACID, PLASMA
Lactic Acid, Venous: 2.4 mmol/L (ref 0.5–1.9)
Lactic Acid, Venous: 2.5 mmol/L (ref 0.5–1.9)
Lactic Acid, Venous: 2.9 mmol/L (ref 0.5–1.9)

## 2021-04-25 LAB — GLUCOSE, CAPILLARY: Glucose-Capillary: 116 mg/dL — ABNORMAL HIGH (ref 70–99)

## 2021-04-25 LAB — MRSA NEXT GEN BY PCR, NASAL: MRSA by PCR Next Gen: NOT DETECTED

## 2021-04-25 LAB — PROCALCITONIN: Procalcitonin: 0.44 ng/mL

## 2021-04-25 LAB — CORTISOL-AM, BLOOD: Cortisol - AM: 82.4 ug/dL — ABNORMAL HIGH (ref 6.7–22.6)

## 2021-04-25 LAB — PROTIME-INR
INR: 1.4 — ABNORMAL HIGH (ref 0.8–1.2)
Prothrombin Time: 16.7 seconds — ABNORMAL HIGH (ref 11.4–15.2)

## 2021-04-25 MED ORDER — CHLORHEXIDINE GLUCONATE CLOTH 2 % EX PADS
6.0000 | MEDICATED_PAD | Freq: Every day | CUTANEOUS | Status: DC
  Administered 2021-04-25 – 2021-05-01 (×7): 6 via TOPICAL

## 2021-04-25 MED ORDER — HYDRALAZINE HCL 20 MG/ML IJ SOLN
10.0000 mg | Freq: Four times a day (QID) | INTRAMUSCULAR | Status: DC | PRN
  Administered 2021-04-25 – 2021-04-28 (×6): 10 mg via INTRAVENOUS
  Filled 2021-04-25 (×6): qty 1

## 2021-04-25 MED ORDER — ADULT MULTIVITAMIN W/MINERALS CH
1.0000 | ORAL_TABLET | Freq: Every day | ORAL | Status: DC
  Administered 2021-04-25 – 2021-05-01 (×7): 1 via ORAL
  Filled 2021-04-25 (×7): qty 1

## 2021-04-25 MED ORDER — NEPRO/CARBSTEADY PO LIQD
237.0000 mL | Freq: Three times a day (TID) | ORAL | Status: DC
  Administered 2021-04-25 – 2021-05-01 (×12): 237 mL via ORAL

## 2021-04-25 NOTE — Progress Notes (Signed)
Spoke to Dr. Reesa Chew when rounding about Esmolol gtt and why patient is on it.  We are using for blood pressure control but have no parameters of what we should keep pressures at.  Awaiting discussion with vascular or MD to respond.  Will continue to titrate and keep blood pressure WNL.

## 2021-04-25 NOTE — Progress Notes (Addendum)
Nutrition Follow-up  DOCUMENTATION CODES:   Severe malnutrition in context of chronic illness  INTERVENTION:   -Feeding assistance with meals -MVI with minerals daily -Nepro Shake po TID, each supplement provides 425 kcal and 19 grams protein  -Magic cup TID with meals, each supplement provides 290 kcal and 9 grams of protein  -Vital Cuisine Shake TID with meals, each supplement provides 520 kcals and 22 grams protein   NUTRITION DIAGNOSIS:   Severe Malnutrition related to chronic illness (COPD) as evidenced by severe fat depletion, severe muscle depletion.  GOAL:   Patient will meet greater than or equal to 90% of their needs  MONITOR:   PO intake, Supplement acceptance, Diet advancement, Labs, Weight trends, Skin, I & O's  REASON FOR ASSESSMENT:   Malnutrition Screening Tool    ASSESSMENT:   James Dickerson is a 80 y.o. African-American male with medical history significant for COPD, depression, GERD, dyslipidemia and hypertension, who presented to the ER with acute onset of altered mental status with recent recurrent falls.  He had a subsequent contusion to his right cheek.  He was complaining in triage from chest pain as well as hip pain.  The patient is a poor historian due to his altered mental status.  Pt admitted with AMS  with recurrent falls in the setting of UTI.   12/2- s/p BSE- downgraded to dysphagia 1 diet with nectar thick liquids  Reviewed I/O's: +248 ml x 24 hours   Spoke with pt at bedside, who opened eyes and looked at RD when spoken to, but did not answer any questions.   Case discussed with RN, who reports that pt is AMS and has delayed responses. He is also very soft spoken. RN reports that pt ate very little today and often chews for extended periods of time. Pt also demonstrated prolonged chewing on medications with applesauce. Discussed with MD, who agrees for swallow evaluation.   Spoke with representative from group home, who reports pt had very  poor oral intake PTA and was on a regular diet. Pt is alert and communicative at baseline. She is unsure if pt has lost weight; she explains that pt was just transferred to her facility about 3 days PTA.   Medications reviewed and include vitamin D and megace.   Labs reviewed: CBGS: 116 (inpatient orders for glycemic control are none).    NUTRITION - FOCUSED PHYSICAL EXAM:  Flowsheet Row Most Recent Value  Orbital Region Moderate depletion  Upper Arm Region Severe depletion  Thoracic and Lumbar Region Moderate depletion  Buccal Region Severe depletion  Temple Region Severe depletion  Clavicle Bone Region Severe depletion  Clavicle and Acromion Bone Region Severe depletion  Scapular Bone Region Severe depletion  Dorsal Hand Severe depletion  Patellar Region Severe depletion  Anterior Thigh Region Severe depletion  Posterior Calf Region Severe depletion  Edema (RD Assessment) None  Hair Reviewed  Eyes Reviewed  Mouth Reviewed  Skin Reviewed  Nails Reviewed       Diet Order:   Diet Order             DIET - DYS 1 Room service appropriate? Yes; Fluid consistency: Nectar Thick  Diet effective now                   EDUCATION NEEDS:   Not appropriate for education at this time  Skin:  Skin Assessment: Skin Integrity Issues: Skin Integrity Issues:: Other (Comment) Stage I: - Other: eccymosis on buttocks and back  Last  BM:  Unknown  Height:   Ht Readings from Last 1 Encounters:  04/24/21 5\' 3"  (1.6 m)    Weight:   Wt Readings from Last 1 Encounters:  04/24/21 63.5 kg    Ideal Body Weight:  56.3 kg  BMI:  Body mass index is 24.8 kg/m.  Estimated Nutritional Needs:   Kcal:  2000-2200  Protein:  110-125 grams  Fluid:  > 2 L    Loistine Chance, RD, LDN, [place] Registered Dietitian II Certified Diabetes Care and Education Specialist Please refer to [facility] for RD and/or RD on-call/weekend/after hours pager

## 2021-04-25 NOTE — Plan of Care (Signed)

## 2021-04-25 NOTE — Progress Notes (Signed)
PT Cancellation Note  Patient Details Name: Rajesh Wyss MRN: [id number] DOB: [birth year]   Cancelled Treatment:    Reason Eval/Treat Not Completed: Other (comment);Patient not medically ready PT orders received, chart reviewed. Pt new to cardene drip with still variable HR. Also spoke with MD via secure chat who recommends to hold PT evaluation as pt is awaiting a vascular & orthopedic consult. Will f/u as able & as pt is medically appropriate for PT intervention.  [name], PT, DPT 04/25/21, 9:04 AM    [name] 04/25/2021, 9:04 AM

## 2021-04-25 NOTE — Progress Notes (Addendum)
PROGRESS NOTE    James Dickerson  JJH:[id number] DOB: March 20, 1941 DOA: 04/24/2021 PCP: Pcp, No   Brief Narrative: Taken from H&P. James Dickerson is a 80 y.o. African-American male with medical history significant for COPD, depression, GERD, dyslipidemia and hypertension, who presented to the ER with acute onset of altered mental status with recent recurrent falls.  He had a subsequent contusion to his right cheek.  Patient was recently moved to a different group home on Wednesday.  On presentation to ED patient had markedly elevated blood pressure, tachycardic mom, mildly tachypneic, labs only pertinent for mild hypokalemia.  COVID-19 and influenza PCR negative.  UA concerning for UTI.  Multiple imaging for concern of falls and injuries shows multiple fractures which include right fifth and sixth rib, T12, and right proximal fibula.  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  A small amount of arterial contrast extended into the presumed hematoma, concern for leaking aneurysm.  Vascular surgery was consulted and they are recommending conservative management with blood pressure control per admitting provider note, has not seen any notes from vascular surgery yet. He was started on esmolol infusion for better control of hypertension. CT head, cervical spine and sinuses were without any acute findings.  Orthopedic was consulted this morning, notify Dr. Mack Guise about right proximal fibular fracture.  Talked with daughter today and according to her patient has very poor functional status at baseline, bed bound most of the time, not eating and drinking.  They moved him to a different group home recently.  He has 2 daughters, 1 lives in [place].  It looks like family is not much involved in his care.  Discussed his full code with her and with her  permission changed status to DNR with full scope of medical care as performing a CPR on him will be more damaging and fruitless.  Extremely malnourished elderly man, very poor functional status, aphasic, poor p.o. intake and advanced dementia at baseline, very high risk for deterioration and death.  Palliative care was also consulted.  Subjective: Patient was seen and examined today.  He was just nodding yes or no, moving right lower extremity intermittently and appears to be in discomfort.  Following some simple commands. Per nursing staff he was keeping the food in his mouth for a very prolonged time-swallow evaluation was ordered.  Assessment & Plan:   Principal Problem:   Altered mental status  Altered mental status with recent recurrent falls/multiple fractures.  Resulted in multiple fractures which include right fifth and sixth rib, T12 and right proximal fibula.  Patient has an history of advanced dementia so not sure how much mental status is changed from baseline.  Very poor functional status, mostly bedbound and poor p.o. intake for a while with declining health per daughter. -We will obtain PT/OT evaluation once more stable. -Orthopedic surgery was consulted-will appreciate their recommendations.  Sepsis.  Patient met sepsis criteria with tachycardia, tachypnea and a presumed UTI.  Preliminary blood cultures negative so far.  Urine cultures pending.  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  Vascular surgery was consulted from ED, no formal note yet but according to admitting provider note that they advised conservative management with blood pressure control.  He was  started on esmolol infusion. -Message sent to Dr. Lucky Cowboy about parameters for esmolol infusion. -Continue home dose of amlodipine and Diovan.  -Appreciate formal vascular  surgery evaluation.  History of prior CVA.  CT head negative for any acute findings.  Patient is aphasic for a long time. -Continue home dose of statin -Obtain swallow evaluation with nursing concern of keeping the food in mouth for a long time.  Hypokalemia.  Resolved -Monitor potassium and replete as needed.  Hypertensive urgency.  Blood pressure improving on esmolol infusion. -Continue with esmolol infusion -Continue with home meds  Severe protein caloric malnutrition.  Severe muscle wasting and poor p.o. intake. -Dietitian consult  Elevated troponin.  Mildly elevated troponin, most likely secondary to demand ischemia.  No chest pain.  Troponin with a flat curve.  Dyslipidemia. -Continue with statin  Dementia. -Continue with home dose of Aricept  History of overactive bladder. Continue home dose of Myrbetriq  BPH. -Continue home dose of Flomax  COPD without exacerbation. -Continue as needed bronchodilators  GERD. -Continue PPI  Objective: Vitals:   04/25/21 0800 04/25/21 0934 04/25/21 1100 04/25/21 1200  BP: (!) 152/106 (!) 171/95 139/77 134/85  Pulse: 83  91 (!) 35  Resp: _0 Temp: (!) 97.5 F (36.4 C)   98.2 F (36.8 C)  TempSrc: Oral   Oral  SpO2: 94%  (!) 22% 95%  Weight:      Height:        Intake/Output Summary (Last 24 hours) at 04/25/2021 1431 Last data filed at 04/25/2021 1355 Gross per 24 hour  Intake 778.22 ml  Output --  Net 778.22 ml   Filed Weights   04/24/21 1338  Weight: 63.5 kg    Examination:  General exam: Severely malnourished elderly man, appears calm and comfortable, aphasic, just nodding yes or no but following simple commands. Respiratory system: Clear to auscultation. Respiratory effort normal. Cardiovascular system: S1 & S2 heard, RRR.  Gastrointestinal system: Soft, nontender, nondistended, bowel sounds positive. Central nervous system: Alert, unable to judge orientation, no apparent focal deficit. Extremities:  No edema, no cyanosis, pulses intact and symmetrical. Psychiatry: Judgement and insight appear impaired   DVT prophylaxis: SCDs, patient has leaking thoracic aneurysm with mural thrombus Code Status: DNR-discussed with daughter and changed the status from full code to DNR with full scope of medical care Family Communication: Discussed with daughter on phone Disposition Plan:  Status is: Inpatient  Remains inpatient appropriate because: Severity of illness.   Level of care: Stepdown  All the records are reviewed and case discussed with Care Management/Social Worker. Management plans discussed with the patient, nursing and they are in agreement.  Consultants:  Vascular surgery Orthopedic surgery  Procedures:  Antimicrobials:  Ceftriaxone  Data Reviewed: I have personally reviewed following labs and imaging studies  CBC: Recent Labs  Lab 04/24/21 1341 04/25/21 0218  WBC 8.7 8.8  HGB 12.5* 12.8*  HCT 38.0* 39.2  MCV 93.6 93.8  PLT 154 494*   Basic Metabolic Panel: Recent Labs  Lab 04/24/21 1341 04/25/21 0218  NA 135 136  K 3.2* 3.6  CL 101 104  CO2 26 22  GLUCOSE 117* 113*  BUN 21 23  CREATININE 1.19 1.13  CALCIUM 9.2 9.2   GFR: Estimated Creatinine Clearance: 41.3 mL/min (by C-G formula based on SCr of 1.13 mg/dL). Liver Function Tests: No results for input(s): AST, ALT, ALKPHOS, BILITOT, PROT, ALBUMIN in the last 168 hours. No results for input(s): LIPASE, AMYLASE in the last  168 hours. No results for input(s): AMMONIA in the last 168 hours. Coagulation Profile: Recent Labs  Lab 04/25/21 0218  INR 1.4*   Cardiac Enzymes: No results for input(s): CKTOTAL, CKMB, CKMBINDEX, TROPONINI in the last 168 hours. BNP (last 3 results) No results for input(s): PROBNP in the last 8760 hours. HbA1C: No results for input(s): HGBA1C in the last 72 hours. CBG: Recent Labs  Lab 04/25/21 0040  GLUCAP 116*   Lipid Profile: No results for input(s): CHOL, HDL,  LDLCALC, TRIG, CHOLHDL, LDLDIRECT in the last 72 hours. Thyroid Function Tests: No results for input(s): TSH, T4TOTAL, FREET4, T3FREE, THYROIDAB in the last 72 hours. Anemia Panel: No results for input(s): VITAMINB12, FOLATE, FERRITIN, TIBC, IRON, RETICCTPCT in the last 72 hours. Sepsis Labs: Recent Labs  Lab 04/24/21 2337 04/25/21 0218 04/25/21 0913  PROCALCITON  --  0.44  --   LATICACIDVEN 2.9* 2.5* 2.4*    Recent Results (from the past 240 hour(s))  Resp Panel by RT-PCR (Flu A&B, Covid) Nasopharyngeal Swab     Status: None   Collection Time: 04/24/21  9:12 PM   Specimen: Nasopharyngeal Swab; Nasopharyngeal(NP) swabs in vial transport medium  Result Value Ref Range Status   SARS Coronavirus 2 by RT PCR NEGATIVE NEGATIVE Final    Comment: (NOTE) SARS-CoV-2 target nucleic acids are NOT DETECTED.  The SARS-CoV-2 RNA is generally detectable in upper respiratory specimens during the acute phase of infection. The lowest concentration of SARS-CoV-2 viral copies this assay can detect is 138 copies/mL. A negative result does not preclude SARS-Cov-2 infection and should not be used as the sole basis for treatment or other patient management decisions. A negative result may occur with  improper specimen collection/handling, submission of specimen other than nasopharyngeal swab, presence of viral mutation(s) within the areas targeted by this assay, and inadequate number of viral copies(<138 copies/mL). A negative result must be combined with clinical observations, patient history, and epidemiological information. The expected result is Negative.  Fact Sheet for Patients:  EntrepreneurPulse.com.au  Fact Sheet for Healthcare Providers:  IncredibleEmployment.be  This test is no t yet approved or cleared by the [place] FDA and  has been authorized for detection and/or diagnosis of SARS-CoV-2 by FDA under an Emergency Use Authorization (EUA).  This EUA will remain  in effect (meaning this test can be used) for the duration of the COVID-19 declaration under Section 564(b)(1) of the Act, 21 U.S.C.section 360bbb-3(b)(1), unless the authorization is terminated  or revoked sooner.       Influenza A by PCR NEGATIVE NEGATIVE Final   Influenza B by PCR NEGATIVE NEGATIVE Final    Comment: (NOTE) The Xpert Xpress SARS-CoV-2/FLU/RSV plus assay is intended as an aid in the diagnosis of influenza from Nasopharyngeal swab specimens and should not be used as a sole basis for treatment. Nasal washings and aspirates are unacceptable for Xpert Xpress SARS-CoV-2/FLU/RSV testing.  Fact Sheet for Patients: EntrepreneurPulse.com.au  Fact Sheet for Healthcare Providers: IncredibleEmployment.be  This test is not yet approved or cleared by the [place] FDA and has been authorized for detection and/or diagnosis of SARS-CoV-2 by FDA under an Emergency Use Authorization (EUA). This EUA will remain in effect (meaning this test can be used) for the duration of the COVID-19 declaration under Section 564(b)(1) of the Act, 21 U.S.C. section 360bbb-3(b)(1), unless the authorization is terminated or revoked.  Performed at [facility], [place],  [address]   CULTURE, BLOOD (ROUTINE X 2) w Reflex to ID  Panel     Status: None (Preliminary result)   Collection Time: 04/24/21 11:37 PM   Specimen: BLOOD  Result Value Ref Range Status   Specimen Description BLOOD LEFT FOREARM  Final   Special Requests   Final    BOTTLES DRAWN AEROBIC AND ANAEROBIC Blood Culture results may not be optimal due to an inadequate volume of blood received in culture bottles   Culture   Final    NO GROWTH < 12 HOURS Performed at [facility], [address] [place], [place] [address]    Report Status PENDING  Incomplete  CULTURE, BLOOD (ROUTINE X 2) w Reflex to ID Panel     Status: None  (Preliminary result)   Collection Time: 04/24/21 11:37 PM   Specimen: BLOOD  Result Value Ref Range Status   Specimen Description BLOOD RIGHT ASSIST CONTROL  Final   Special Requests   Final    BOTTLES DRAWN AEROBIC AND ANAEROBIC Blood Culture adequate volume   Culture   Final    NO GROWTH < 12 HOURS Performed at [facility], [address] [place], [place] [address]    Report Status PENDING  Incomplete  MRSA Next Gen by PCR, Nasal     Status: None   Collection Time: 04/25/21 12:50 AM   Specimen: Nasal Mucosa; Nasal Swab  Result Value Ref Range Status   MRSA by PCR Next Gen NOT DETECTED NOT DETECTED Final    Comment: (NOTE) The GeneXpert MRSA Assay (FDA approved for NASAL specimens only), is one component of a comprehensive MRSA colonization surveillance program. It is not intended to diagnose MRSA infection nor to guide or monitor treatment for MRSA infections. Test performance is not FDA approved in patients less than 23 years old. Performed at [facility], [address] [place], [place] [address]      Radiology Studies: DG Chest 2 View  Result Date: 04/24/2021 CLINICAL DATA:  Multiple falls.  Right rib pain. EXAM: CHEST - 2 VIEW COMPARISON:  07/22/2020 FINDINGS: Pacer with leads at right atrium and right ventricle. No lead discontinuity. Remote right rib fractures. Bilateral remote rib fractures. No acute displaced fracture identified. Midline trachea. Normal heart size. Transverse aortic dilatation was detailed on the CTA of 11/08/2020. Question increase in soft tissue fullness since prior plain film of 07/22/2020. No pleural effusion or pneumothorax. Scarring in the medial left lower lobe. IMPRESSION: No acute posttraumatic deformity identified. Known thoracic aortic aneurysm, as on 11/07/2020 CT. Question increased soft tissue fullness in this region since prior plain film of 07/22/2020. If suspicion of acute complication, consider repeat CTA.  Electronically Signed   By: Abigail Miyamoto M.D.   On: 04/24/2021 14:55   DG Tibia/Fibula Right  Result Date: 04/25/2021 CLINICAL DATA:  Altered mental status and recurrent falls. EXAM: RIGHT TIBIA AND FIBULA - 2 VIEW COMPARISON:  None. FINDINGS: An acute versus subacute, very mildly displaced fracture deformity is seen involving the shaft of the proximal right fibula. There is no evidence of dislocation. Soft tissues are unremarkable. IMPRESSION: Acute versus subacute fracture of the proximal right fibula. Electronically Signed   By: Virgina Norfolk M.D.   On: 04/25/2021 00:11   CT HEAD WO CONTRAST  Result Date: 04/24/2021 CLINICAL DATA:  Head trauma EXAM: CT HEAD WITHOUT CONTRAST TECHNIQUE: Contiguous axial images were obtained from the base of the skull through the vertex without intravenous contrast. COMPARISON:  CT head 04/04/2021 FINDINGS: Brain: No acute intracranial hemorrhage, mass effect, or herniation. No extra-axial  fluid collections. No evidence of acute territorial infarct. No hydrocephalus. Extensive hypodensities throughout the periventricular and subcortical white matter, likely secondary to advanced chronic microvascular ischemic changes. 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 Vascular: No hyperdense vessel or unexpected calcification. Skull: Normal. Negative for fracture or focal lesion. Sinuses/Orbits: Mild mucosal thickening in the paranasal sinuses and mild opacification of the mastoid air cells. Other: None. IMPRESSION: No acute intracranial process identified. Multiple chronic findings as described. Electronically Signed   By: Ofilia Neas M.D.   On: 04/24/2021 14:49   CT Cervical Spine Wo Contrast  Result Date: 04/24/2021 CLINICAL DATA:  Trauma EXAM: CT CERVICAL SPINE WITHOUT CONTRAST TECHNIQUE: Multidetector CT imaging of the cervical spine was performed without intravenous contrast. Multiplanar CT image reconstructions were also generated.  COMPARISON:  None. FINDINGS: Alignment: Grade 1 anterolisthesis of C2 on C3. Skull base and vertebrae: No acute fracture. No primary bone lesion or focal pathologic process. Soft tissues and spinal canal: No prevertebral fluid or swelling. No visible canal hematoma. Disc levels: Moderate to severe intervertebral disc height loss at multiple levels from C3 through T1. Associated endplate sclerosis, cysts, uncovertebral spurring and small dorsal endplate osteophytes. Mild facet arthropathy bilaterally. Multilevel neural foraminal narrowing most significant at C5-C6 bilaterally. Likely multilevel mild-to-moderate spinal canal stenoses. Upper chest: Emphysematous changes in the visualized upper lungs. Other: None. IMPRESSION: No acute fracture or subluxation identified. Advanced degenerative changes. Electronically Signed   By: Ofilia Neas M.D.   On: 04/24/2021 14:52   CT Angio Chest/Abd/Pel for Dissection W and/or Wo Contrast  Result Date: 04/24/2021 CLINICAL DATA:  Known worsening thoracic aortic aneurysm EXAM: CT ANGIOGRAPHY CHEST, ABDOMEN AND PELVIS TECHNIQUE: Non-contrast CT of the chest was initially obtained. Multidetector CT imaging through the chest, abdomen and pelvis was performed using the standard protocol during bolus administration of intravenous contrast. Multiplanar reconstructed images and MIPs were obtained and reviewed to evaluate the vascular anatomy. CONTRAST:  68m OMNIPAQUE IOHEXOL 350 MG/ML SOLN COMPARISON:  Similar study from 5 hours previous. FINDINGS: CTA CHEST FINDINGS Cardiovascular: No significant cardiac enlargement is noted. Pacing device is again noted and stable. Pulmonary artery as visualized is within normal limits. The ascending thoracic aorta again appears within normal limits. Some turbulence in the descending thoracic aorta shows poor admixing of contrast and blood. No new dissection flap is seen. There remains a focal saccular aneurysm at the level of the proximal  descending thoracic aorta similar to that seen on prior exam. There has however been increase in the degree of enhancement in the saccular aneurysm consistent with some worsening mural hematoma. This area now measures 5.9 x 3.6 cm (previously measuring 5.3 x 3.2 cm) the more distal descending thoracic aorta shows some admixture abnormalities although no true dissection flap is seen. Mediastinum/Nodes: Thoracic inlet is within normal limits. Esophagus is unremarkable. No sizable hilar or mediastinal adenopathy is noted. Lungs/Pleura: Emphysematous changes are seen. Stable scarring in the lung bases is noted. No new focal abnormality is identified from the earlier CT. Musculoskeletal: Old healed rib fractures are noted on the left. Stable fractures on the right are noted involving the fifth and sixth ribs anterior laterally. T12 compression fracture is noted stable in appearance from the recent exam. Review of the MIP images confirms the above findings. CTA ABDOMEN AND PELVIS FINDINGS VASCULAR Aorta: Aortic stent graft is noted in place. Patent flow through the stent graft is identified into the external iliac artery on the left and distal common  iliac artery on the right. The excluded aneurysm sac is stable in appearance when compared with the prior study. No new endoleak is identified. Celiac: Celiac axis is widely patent. SMA: SMA is stable in appearance. No evidence of thrombosis or embolism is seen. Renals: Single renal arteries are identified bilaterally. No focal stenosis is seen. IMA: Occluded at its origin secondary to the stent graft. Reconstitution of distal branches is noted. Inflow: Iliacs are patent via a runoff from the stent graft. Stenting in the right external iliac artery is seen. Common femoral artery is within normal limits bilaterally. Right internal iliac artery is patent. The left internal iliac artery is occluded at its origin also stable from the prior study. Veins: No specific venous  abnormality is noted. Review of the MIP images confirms the above findings. NON-VASCULAR Hepatobiliary: No focal liver abnormality is seen. No gallstones, gallbladder wall thickening, or biliary dilatation. Pancreas: Unremarkable. No pancreatic ductal dilatation or surrounding inflammatory changes. Spleen: Normal in size without focal abnormality. Adrenals/Urinary Tract: Adrenal glands are within normal limits. Kidneys are within normal limits with the exception of a large left renal cyst stable from the prior exam. Scattered vascular calcifications are seen. No definitive renal stones are seen. Ureters appear within normal limits. The bladder is partially distended with contrast from prior CT. Stomach/Bowel: No obstructive or inflammatory changes of the bowel are seen. Lymphatic: No sizable lymphadenopathy is noted. Reproductive: Prostate is unremarkable. Other: No abdominal wall hernia or abnormality. No abdominopelvic ascites. Musculoskeletal: No acute or significant osseous findings. Review of the MIP images confirms the above findings. 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 Some slight increase in the degree of opacification of aneurysm is seen consistent with slight enlarging mural aneurysm. No new dissection is seen. Right fifth and sixth rib fractures stable from recent exam. T12 compression deformity also stable from the recent exam. CTA of the abdomen and pelvis: Known aortic stent graft which is widely patent. No occlusion is noted. No new vascular abnormality to correspond with the patient's given clinical history is seen. No new visceral abnormality is identified to correspond with the increase in abdominal pain. Electronically Signed   By: Inez Catalina M.D.   On: 04/24/2021 23:00   CT Angio Chest/Abd/Pel for Dissection W and/or Wo Contrast  Result Date: 04/24/2021 CLINICAL DATA:  Falls. Chest radiograph  suggests increased soft tissue in the region of the known thoracic aortic aneurysm. EXAM: CT ANGIOGRAPHY CHEST, ABDOMEN AND PELVIS TECHNIQUE: Non-contrast CT of the chest was initially obtained. Multidetector CT imaging through the chest, abdomen and pelvis was performed using the standard protocol during bolus administration of intravenous contrast. Multiplanar reconstructed images and MIPs were obtained and reviewed to evaluate the vascular anatomy. CONTRAST:  83m OMNIPAQUE IOHEXOL 350 MG/ML SOLN COMPARISON:  Current chest radiograph. Chest, abdomen and pelvis CTA, 11/07/2020. FINDINGS: CTA CHEST FINDINGS Cardiovascular: As suggested on the current chest radiograph, there has been a change in the appearance of the aneurysm of the aortic arch. At the level of the focal bulge of the aortic arch, just distal to the left subclavian artery origin, there is now increased soft tissue attenuation that is contiguous with the wall, average Hounsfield units 22. Also, there is contrast that extends into this soft tissue at the level of the focal aortic bulge, consistent with arterial blood dissecting into the mural thrombus/mural hematoma. This soft tissue measures 1.8 cm in thickness along the  lateral wall the distal arch, measuring only a few mm on the prior CT, and measures approximally 5.4 x 3.3 cm inferior to the focal bulge, where it measured approximally 3.6 x 2.7 cm previously. This is suspected to be mural hemorrhage has increased from prior exam. The ascending thoracic aorta measures 3.2 cm in diameter. At the level of the aneurysm of the distal arch the lumen now measures 3.8 cm, 3.6 cm previously. At the level of the inferior focal bulge of the distal arch, on the sagittal reconstructed images, the lumen measures 4 cm, previously 3.9 cm. Overall thoracic aorta at the level of the saccular aneurysm, including the wall and hematoma now measures 5.5 cm on the sagittal reconstructed image. The descending thoracic  aorta, from wall to wall, measures 4.4 cm, above the level of the hiatus, without significant change. No aortic dissection. Heart is normal in size and configuration.  No pericardial effusion. Mediastinum/Nodes: No neck base, mediastinal or hilar masses or enlarged lymph nodes. Trachea and esophagus are unremarkable. Lungs/Pleura: Mild linear atelectasis and/or scarring at the lung bases. Centrilobular emphysema. No evidence of pneumonia or pulmonary edema. No pleural effusion or pneumothorax. No significant change. Musculoskeletal: Mild compression fracture of T12, new since the prior CT. There is slight posterior contour bulging of the vertebra, but no retropulsed fragment or significant encroachment upon the spinal. There are recent fractures of the lateral right fifth and sixth ribs. There may be additional fractures, with this assessment limited by motion. No other fractures.  No bone lesions. Review of the MIP images confirms the above findings. CTA ABDOMEN AND PELVIS FINDINGS VASCULAR Aorta: Previous exclusion of an abdominal aortic aneurysm with a stent graft, unchanged compared to the prior CT native aneurysm measuring 4.1 cm anterior-posterior by 5 cm transversely. Aortic lumen remains widely patent. Celiac: Patent without evidence of aneurysm, dissection, vasculitis or significant stenosis. SMA: Patent without evidence of aneurysm, dissection, vasculitis or significant stenosis. Renals: Images are degraded by patient motion. No convincing significant renal artery stenosis or change from the prior CTA. IMA: Excluded at its origin with collateral flow again noted, unchanged from the prior CTA. Inflow: Iliac artery stent grafts are widely patent. Veins: No obvious venous abnormality within the limitations of this arterial phase study. Review of the MIP images confirms the above findings. NON-VASCULAR Hepatobiliary: No focal liver abnormality is seen. No gallstones, gallbladder wall thickening, or biliary  dilatation. Pancreas: Unremarkable. No pancreatic ductal dilatation or surrounding inflammatory changes. Spleen: Normal in size without focal abnormality. Adrenals/Urinary Tract: No adrenal masses. Left renal cyst. Small nonobstructing stone in the lower pole the right kidney suggested. No hydronephrosis. Ureters not well visualized, but no evidence of ureteral dilation or stone. Bladder is unremarkable. Stomach/Bowel: Bowel assessment somewhat limited by motion. Stomach is unremarkable. Small bowel and colon are normal in caliber. No wall thickening convincing inflammation. Lymphatic: No enlarged lymph nodes. Reproductive: Unremarkable. Other: No abdominal wall hernia.  No ascites. Musculoskeletal: No fracture or bone lesion. Review of the MIP images confirms the above findings. IMPRESSION: CTA FINDINGS 1. 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 This increased soft tissue is consistent with an enlarging mural hematoma. A small amount arterial contrast extends into this presumed hematoma, just inferior to the saccular portion of the aneurysm. 2. Other findings of the thoracoabdominal aorta, including the excluded infrarenal abdominal aortic aneurysm, are without significant change from the prior CTA. NON CTA FINDINGS 1. Recent,  mild compression fracture of T12, not present on the prior CTA. Patient's chest radiographic history reports multiple falls and right rib pain. T12 fracture is therefore suspected to be acute. 2. Recent/acute fractures of the right lateral fifth and sixth ribs. There may be other rib fractures, but this assessment is limited by respiratory motion. 3. No other nonvascular acute abnormalities within the chest, abdomen or pelvis. Electronically Signed   By: Lajean Manes M.D.   On: 04/24/2021 17:54   DG HIP UNILAT WITH PELVIS 2-3 VIEWS LEFT  Result Date: 04/24/2021 CLINICAL DATA:  Fall, left hip pain EXAM: DG HIP  (WITH OR WITHOUT PELVIS) 2-3V LEFT COMPARISON:  CT abdomen/pelvis 07/23/2020 FINDINGS: There is no acute fracture or dislocation. Femoroacetabular alignment is maintained. The SI joints and symphysis pubis are intact. Vascular stents are noted. IMPRESSION: No acute fracture or dislocation. Electronically Signed   By: Valetta Mole M.D.   On: 04/24/2021 14:54   CT Maxillofacial Wo Contrast  Result Date: 04/24/2021 CLINICAL DATA:  Facial trauma EXAM: CT MAXILLOFACIAL WITHOUT CONTRAST TECHNIQUE: Multidetector CT imaging of the maxillofacial structures was performed. Multiplanar CT image reconstructions were also generated. COMPARISON:  CT maxillofacial 04/04/2021 FINDINGS: Osseous: No acute fracture or dislocation identified, given limitations of motion. No significant change since previous study visualized. Orbits: No acute process. Sinuses: No air-fluid levels. Mild chronic appearing mucosal thickening/mucous retention cysts in the ethmoid and left maxillary sinus. Soft tissues: Right maxillary soft tissue swelling/hematoma. Limited intracranial: No acute abnormality visualized. IMPRESSION: 1. No acute fracture identified. 2. Right infraorbital maxillary soft tissue swelling/hematoma. Electronically Signed   By: Ofilia Neas M.D.   On: 04/24/2021 14:56    Scheduled Meds:  aspirin EC  81 mg Oral Q0600   atorvastatin  40 mg Oral QHS   Chlorhexidine Gluconate Cloth  6 each Topical Daily   cholecalciferol  2,000 Units Oral Daily   donepezil  10 mg Oral Daily   feeding supplement (NEPRO CARB STEADY)  237 mL Oral TID BM   irbesartan  150 mg Oral Daily   megestrol  40 mg Oral Daily   mirabegron ER  25 mg Oral Daily   multivitamin with minerals  1 tablet Oral Daily   pantoprazole  40 mg Oral BID   tamsulosin  0.4 mg Oral Daily   Continuous Infusions:  cefTRIAXone (ROCEPHIN)  IV Stopped (04/25/21 1003)   esmolol 25 mcg/kg/min (04/25/21 1355)   niCARDipine Stopped (04/25/21 0120)     LOS: 1 day    Time spent: 50 minutes. More than 50% of the time was spent in counseling/coordination of care with consultants and family.  Lorella Nimrod, MD [facility]  If 7PM-7AM, please contact night-coverage [email or web address]  04/25/2021, 2:31 PM   This record has been created using Systems analyst. Errors have been sought and corrected,but may not always be located. Such creation errors do not reflect on the standard of care.

## 2021-04-25 NOTE — Progress Notes (Signed)
Pt is alert and oriented to self and knows that he is in the hospital. Refusing to take PO medications or anything by mouth at this time.

## 2021-04-25 NOTE — TOC Initial Note (Signed)
Transition of Care [facility]) - Initial/Assessment Note    Patient Details  Name: James Dickerson MRN: [id number] Date of Birth: 1940-06-18  Transition of [facility] CM/SW Contact:    Kerin Salen, RN Phone Number: 04/25/2021, 11:35 AM  Clinical Narrative:  Spoke with Daughter Pamala Hurry who says patient is currently living in a [place] to call Oceanographer from [employer] is the administrator and able to give more information on patient, but he has only been with them since Wed. from another facility.  [phone] Daughter is Virlan Kempker [phone]. Attempted to call [name] no answer LVM to return call.                     Patient Goals and CMS Choice        Expected Discharge Plan and Services                                                Prior Living Arrangements/Services                       Activities of Daily Living      Permission Sought/Granted                  Emotional Assessment              Admission diagnosis:  Lower urinary tract infectious disease [N39.0] Swelling [R60.9] Altered mental status [R41.82] Hip pain [M25.559] Contusion [T14.8XXA] Thoracic aortic aneurysm (TAA), unspecified part, unspecified whether ruptured [I71.20] Patient Active Problem List   Diagnosis Date Noted   Altered mental status 04/24/2021   Protein-calorie malnutrition, severe 07/24/2020   Hematuria 07/23/2020   UTI (urinary tract infection) 07/22/2020   Foley catheter in place 07/22/2020   GERD (gastroesophageal reflux disease) 07/22/2020   Elevated lactic acid level 07/22/2020   Gross hematuria 07/22/2020   AAA (abdominal aortic aneurysm) 06/23/2020   Status post surgery 06/22/2020   Thoracic aortic aneurysm without rupture 12/18/2019   Respiratory failure ([place]) 07/11/2017   Acute respiratory failure ([place]) 07/10/2017   CAP (community acquired pneumonia) 07/10/2017   Influenza A 07/10/2017   COPD (chronic obstructive pulmonary  disease) ([place]) 07/10/2017   HLD (hyperlipidemia) 03/09/2017   Essential hypertension 08/14/2016   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

## 2021-04-25 NOTE — Evaluation (Signed)
Clinical/Bedside Swallow Evaluation Patient Details  Name: James Dickerson MRN: [id number] Date of Birth: 1940-09-19  Today's Date: 04/25/2021 Time: SLP Start Time (ACUTE ONLY): 64 SLP Stop Time (ACUTE ONLY): 6834 SLP Time Calculation (min) (ACUTE ONLY): 110 min  Past Medical History:  Past Medical History:  Diagnosis Date   Back pain    Cardiomegaly    COPD (chronic obstructive pulmonary disease) (HCC)    Cystic kidney disease    Depressive disorder    GERD (gastroesophageal reflux disease)    High cholesterol    History of prostate cancer    Hypertension    Prostate cancer ([place]) 2018   Rad seeds   Thyroid nodule    Past Surgical History:  Past Surgical History:  Procedure Laterality Date   ABDOMINAL AORTIC ENDOVASCULAR STENT GRAFT N/A 06/22/2020   Procedure: ABDOMINAL AORTIC ENDOVASCULAR STENT GRAFT;  Surgeon: Cherre Robins, MD;  Location: [place];  Service: Vascular;  Laterality: N/A;   [place] N/A 06/22/2020   Procedure: CYSTOSCOPY WITH URETHRAL DILATATION;  Surgeon: Alexis Frock, MD;  Location: [place];  Service: Urology;  Laterality: N/A;   INSERTION OF ILIAC STENT Right 06/22/2020   Procedure: INSERTION OF RIGHT EXTERNAL ILIAC ARTERY STENT;  Surgeon: Cherre Robins, MD;  Location: [place];  Service: Vascular;  Laterality: Right;   INSERTION OF SUPRAPUBIC CATHETER N/A 06/22/2020   Procedure: INSERTION OF SUPRAPUBIC CATHETER;  Surgeon: Alexis Frock, MD;  Location: [place];  Service: Urology;  Laterality: N/A;   PACEMAKER INSERTION N/A 08/06/2016   Procedure: INSERTION PACEMAKER;  Surgeon: Isaias Cowman, MD;  Location: [facility];  Service: Cardiovascular;  Laterality: N/A;   HPI:  Per admitting H&P'James Dickerson is a 80 y.o. African-American male with medical history significant for COPD, depression, GERD, dyslipidemia and hypertension, who presented to the ER with acute onset of altered mental status with recent recurrent  falls.  He had a subsequent contusion to his right cheek.  He was complaining in triage from chest pain as well as hip pain.  The patient is a poor historian due to his altered mental status"    Assessment / Plan / Recommendation  Clinical Impression  Pt presents with moderate dysphagia with moderate to high risk of aspiration. Pt was only able to whisper and not able to produce any voicing with max cues and several attempts. Pt was also not able to produce a productive cough with cueing or after suspected aspiration of thin liquids. Oral mech exam revealed structures to be functioning adequately. Given thin liquids, Pt presented with attempted coughing after swallowing but was not able to produce a productive cough. He tolerated nectar thick liquids in very small portions as well as applesauce. Given a small bite of graham cracker, Pt chewed for an extended amount of time prior to swallowing. Chart reviewed and dicsussion with Nsg. There is no clear indication if aphonia is of new onset as Pt was only in this new facitiy for a few days. Rec trial of Dys 1/pureed with nectar thick liquids by Tsp or very small sips. If Pt shows any attempts to cough, please stop feeding. Pt may benefit from Mod ba swallow to further assess risk of aspiration. If Aphonia is new, also consider ENT consult for further assessment of larynx/vocal folds. Prognosis fair. Strict aspiration precautions with feeding. SLP Visit Diagnosis: Dysphagia, oropharyngeal phase (R13.12)    Aspiration Risk  Moderate aspiration risk;Severe aspiration risk    Diet Recommendation  Dysphagia 1 (Puree);Nectar-thick liquid   Liquid Administration via: Cup;Spoon Medication Administration: Crushed with puree Supervision: Full supervision/cueing for compensatory strategies Compensations: Small sips/bites Postural Changes: Seated upright at 90 degrees;Remain upright for at least 30 minutes after po intake    Other  Recommendations Recommended  Consults: Consider ENT evaluation Oral Care Recommendations: Oral care before and after PO    Recommendations for follow up therapy are one component of a multi-disciplinary discharge planning process, led by the attending physician.  Recommendations may be updated based on patient status, additional functional criteria and insurance authorization.  Follow up Recommendations Skilled nursing-short term rehab (<3 hours/day)      Assistance Recommended at Discharge Frequent or constant Supervision/Assistance  Functional Status Assessment Patient has had a recent decline in their functional status and demonstrates the ability to make significant improvements in function in a reasonable and predictable amount of time.  Frequency and Duration min 3x week  2 weeks       Prognosis Prognosis for Safe Diet Advancement: Fair Barriers to Reach Goals: Cognitive deficits      Swallow Study   General Date of Onset: 04/24/21 HPI: Per admitting H&P'James Dickerson is a 80 y.o. African-American male with medical history significant for COPD, depression, GERD, dyslipidemia and hypertension, who presented to the ER with acute onset of altered mental status with recent recurrent falls.  He had a subsequent contusion to his right cheek.  He was complaining in triage from chest pain as well as hip pain.  The patient is a poor historian due to his altered mental status" Type of Study: Bedside Swallow Evaluation Diet Prior to this Study: Dysphagia 2 (chopped) Temperature Spikes Noted: No History of Recent Intubation: No Behavior/Cognition: Alert;Cooperative;Pleasant mood;Confused Oral Cavity Assessment: Within Functional Limits Oral Care Completed by SLP: No Oral Cavity - Dentition: Dentures, top;Adequate natural dentition Vision: Functional for self-feeding Self-Feeding Abilities: Needs assist Patient Positioning: Upright in bed Baseline Vocal Quality: Aphonic Volitional Cough: Weak    Oral/Motor/Sensory  Function Overall Oral Motor/Sensory Function: Within functional limits Facial ROM: Within Functional Limits Lingual ROM: Within Functional Limits Lingual Strength: Within Functional Limits Mandible: Within Functional Limits   Ice Chips Ice chips: Within functional limits Presentation: Spoon   Thin Liquid Thin Liquid: Impaired Presentation: Cup;Spoon Pharyngeal  Phase Impairments: Cough - Delayed (attmpted to cough with no success.)    Nectar Thick Nectar Thick Liquid: Within functional limits Presentation: Cup;Spoon   Honey Thick Honey Thick Liquid: Not tested   Puree Puree: Within functional limits Presentation: Spoon   Solid     Solid: Impaired Oral Phase Impairments: Impaired mastication Oral Phase Functional Implications: Impaired mastication;Prolonged oral transit;Oral residue      James Dickerson 04/25/2021,2:37 PM

## 2021-04-25 NOTE — Consult Note (Signed)
Full consult note to follow.    I have reviewed xrays.   Patient with minimally displaced right proximal fibula shaft fracture.  This will not require surgery.   Patient may WBAT on right leg, but may require CAM walker if pain with weightbearing.  Recommend PT evaluation when medically indicated.    Rib fractures will need to heal on their own.   No intervention needed.     No fracture of hip or pelvis on xray.  T12 compression fracture is mild and also will not require surgical intervention at this time.     James Dickerson

## 2021-04-26 ENCOUNTER — Encounter: Payer: Self-pay | Admitting: Family Medicine

## 2021-04-26 LAB — URINE CULTURE: Culture: >=100000 — AB

## 2021-04-26 NOTE — Consult Note (Addendum)
Reason for Consult:Thoracic Saccular Aneurysm Referring Physician: Lorella Nimrod, MD  James Dickerson is an 80 y.o. male.  HPI: Patient who was admitted to the ER for change in mental status and generalized weakness.   Past Medical History:  Diagnosis Date   Back pain    Cardiomegaly    COPD (chronic obstructive pulmonary disease) (HCC)    Cystic kidney disease    Depressive disorder    GERD (gastroesophageal reflux disease)    High cholesterol    History of prostate cancer    Hypertension    Prostate cancer ([place]) 2018   Rad seeds   Thyroid nodule     Past Surgical History:  Procedure Laterality Date   ABDOMINAL AORTIC ENDOVASCULAR STENT GRAFT N/A 06/22/2020   Procedure: ABDOMINAL AORTIC ENDOVASCULAR STENT GRAFT;  Surgeon: Cherre Robins, MD;  Location: [place];  Service: Vascular;  Laterality: N/A;   [place] N/A 06/22/2020   Procedure: CYSTOSCOPY WITH URETHRAL DILATATION;  Surgeon: Alexis Frock, MD;  Location: [place];  Service: Urology;  Laterality: N/A;   INSERTION OF ILIAC STENT Right 06/22/2020   Procedure: INSERTION OF RIGHT EXTERNAL ILIAC ARTERY STENT;  Surgeon: Cherre Robins, MD;  Location: [place];  Service: Vascular;  Laterality: Right;   INSERTION OF SUPRAPUBIC CATHETER N/A 06/22/2020   Procedure: INSERTION OF SUPRAPUBIC CATHETER;  Surgeon: Alexis Frock, MD;  Location: [place];  Service: Urology;  Laterality: N/A;   PACEMAKER INSERTION N/A 08/06/2016   Procedure: INSERTION PACEMAKER;  Surgeon: Isaias Cowman, MD;  Location: [facility];  Service: Cardiovascular;  Laterality: N/A;    History reviewed. No pertinent family history.  Social History:  reports that he has been smoking cigarettes. He has a 16.75 pack-year smoking history. He has never used smokeless tobacco. He reports that he does not drink alcohol and does not use drugs.  Allergies: No Known Allergies  Medications: I have reviewed the patient's current  medications. Prior to Admission:  Medications Prior to Admission  Medication Sig Dispense Refill Last Dose   albuterol (PROVENTIL) (2.5 MG/3ML) 0.083% nebulizer solution Take 2.5 mg by nebulization every 6 (six) hours as needed.   prn at prn   aspirin EC 81 MG EC tablet Take 1 tablet (81 mg total) by mouth daily at 6 (six) AM. Swallow whole. 30 tablet 11 Past Week   atorvastatin (LIPITOR) 40 MG tablet Take 1 tablet (40 mg total) by mouth at bedtime. 30 tablet 0 Past Week   Cholecalciferol (VITAMIN D3) 50 MCG (2000 UT) CHEW Chew 2,000 Units by mouth daily.   Past Week   donepezil (ARICEPT) 10 MG tablet Take 10 mg by mouth daily.   Past Week   ipratropium-albuterol (DUONEB) 0.5-2.5 (3) MG/3ML SOLN Inhale 3 mLs into the lungs every 6 (six) hours as needed (Wheezing).   prn at prn   megestrol (MEGACE) 40 MG tablet Take 40 mg by mouth daily.   Past Week   mirabegron ER (MYRBETRIQ) 25 MG TB24 tablet Take 1 tablet (25 mg total) by mouth daily. 30 tablet 0 Past Week   OXYGEN Inhale 2-3 L into the lungs as needed (at bedtime).   Past Week   pantoprazole (PROTONIX) 40 MG tablet Take 40 mg by mouth 2 (two) times daily.   Past Week   tamsulosin (FLOMAX) 0.4 MG CAPS capsule Take 0.4 mg by mouth daily.   Past Week   valsartan (DIOVAN) 160 MG tablet Take 160 mg by mouth daily.  Past Week   amLODipine (NORVASC) 5 MG tablet Take 5 mg by mouth every morning. (Patient not taking: Reported on 04/24/2021)   Not Taking   budesonide-formoterol (SYMBICORT) 160-4.5 MCG/ACT inhaler Inhale 2 puffs into the lungs 2 (two) times daily. 1 Inhaler 2    Scheduled:  aspirin EC  81 mg Oral Q0600   atorvastatin  40 mg Oral QHS   Chlorhexidine Gluconate Cloth  6 each Topical Daily   cholecalciferol  2,000 Units Oral Daily   donepezil  10 mg Oral Daily   feeding supplement (NEPRO CARB STEADY)  237 mL Oral TID BM   irbesartan  150 mg Oral Daily   megestrol  40 mg Oral Daily   mirabegron ER  25 mg Oral Daily   multivitamin  with minerals  1 tablet Oral Daily   pantoprazole  40 mg Oral BID   tamsulosin  0.4 mg Oral Daily   Continuous:  cefTRIAXone (ROCEPHIN)  IV 2 g (04/26/21 1023)   esmolol 100 mcg/kg/min (04/26/21 0748)   niCARDipine Stopped (04/25/21 0120)   HKV:[id number] **OR** acetaminophen, hydrALAZINE, ipratropium-albuterol, ketorolac, magnesium hydroxide, morphine injection, ondansetron **OR** ondansetron (ZOFRAN) IV, traZODone Anti-infectives (From admission, onward)    Start     Dose/Rate Route Frequency Ordered Stop   04/25/21 0900  cefTRIAXone (ROCEPHIN) 2 g in sodium chloride 0.9 % 100 mL IVPB        2 g 200 mL/hr over 30 Minutes Intravenous Every 24 hours 04/24/21 2159 05/02/21 0859   04/24/21 2115  cefTRIAXone (ROCEPHIN) 1 g in sodium chloride 0.9 % 100 mL IVPB        1 g 200 mL/hr over 30 Minutes Intravenous  Once 04/24/21 2103 04/24/21 2200       Results for orders placed or performed during the hospital encounter of 04/24/21 (from the past 48 hour(s))  Basic metabolic panel     Status: Abnormal   Collection Time: 04/24/21  1:41 PM  Result Value Ref Range   Sodium 135 135 - 145 mmol/L   Potassium 3.2 (L) 3.5 - 5.1 mmol/L   Chloride 101 98 - 111 mmol/L   CO2 26 22 - 32 mmol/L   Glucose, Bld 117 (H) 70 - 99 mg/dL    Comment: Glucose reference range applies only to samples taken after fasting for at least 8 hours.   BUN 21 8 - 23 mg/dL   Creatinine, Ser 1.19 0.61 - 1.24 mg/dL   Calcium 9.2 8.9 - 10.3 mg/dL   GFR, Estimated >60 >60 mL/min    Comment: (NOTE) Calculated using the CKD-EPI Creatinine Equation (2021)    Anion gap 8 5 - 15    Comment: Performed at [facility], [address] [place], [place] [address]  CBC     Status: Abnormal   Collection Time: 04/24/21  1:41 PM  Result Value Ref Range   WBC 8.7 4.0 - 10.5 K/uL   RBC 4.06 (L) 4.22 - 5.81 MIL/uL   Hemoglobin 12.5 (L) 13.0 - 17.0 g/dL   HCT 38.0 (L) 39.0 - 52.0 %   MCV 93.6 80.0 - 100.0 fL   MCH  30.8 26.0 - 34.0 pg   MCHC 32.9 30.0 - 36.0 g/dL   RDW 13.2 11.5 - 15.5 %   Platelets 154 150 - 400 K/uL   nRBC 0.0 0.0 - 0.2 %    Comment: Performed at [facility], [address], [place], [place] [address]  Troponin I (High Sensitivity)  Status: Abnormal   Collection Time: 04/24/21  1:41 PM  Result Value Ref Range   Troponin I (High Sensitivity) 48 (H) <18 ng/L    Comment: (NOTE) Elevated high sensitivity troponin I (hsTnI) values and significant  changes across serial measurements may suggest ACS but many other  chronic and acute conditions are known to elevate hsTnI results.  Refer to the "Links" section for chest pain algorithms and additional  guidance. Performed at [facility], [place], [place] [address]   Troponin I (High Sensitivity)     Status: Abnormal   Collection Time: 04/24/21  6:53 PM  Result Value Ref Range   Troponin I (High Sensitivity) 77 (H) <18 ng/L    Comment: READ BACK AND VERIFIED WITH JASMINE FERIANO @0122  ON 04/25/21 SKL (NOTE) Elevated high sensitivity troponin I (hsTnI) values and significant  changes across serial measurements may suggest ACS but many other  chronic and acute conditions are known to elevate hsTnI results.  Refer to the "Links" section for chest pain algorithms and additional  guidance. Performed at [facility], [address] [place], [place] [address]   Urinalysis, Routine w reflex microscopic     Status: Abnormal   Collection Time: 04/24/21  7:16 PM  Result Value Ref Range   Color, Urine YELLOW (A) YELLOW   APPearance CLEAR (A) CLEAR   Specific Gravity, Urine >1.046 (H) 1.005 - 1.030   pH 6.0 5.0 - 8.0   Glucose, UA NEGATIVE NEGATIVE mg/dL   Hgb urine dipstick SMALL (A) NEGATIVE   Bilirubin Urine NEGATIVE NEGATIVE   Ketones, ur 5 (A) NEGATIVE mg/dL   Protein, ur 30 (A) NEGATIVE mg/dL   Nitrite NEGATIVE NEGATIVE   Leukocytes,Ua MODERATE (A) NEGATIVE   RBC / HPF 0-5 0 - 5  RBC/hpf   WBC, UA 11-20 0 - 5 WBC/hpf   Bacteria, UA NONE SEEN NONE SEEN   Squamous Epithelial / LPF 0-5 0 - 5   Mucus PRESENT     Comment: Performed at [facility], [address], [place], [place] [address]  Urine Culture     Status: Abnormal   Collection Time: 04/24/21  7:16 PM   Specimen: Urine, Clean Catch  Result Value Ref Range   Specimen Description      URINE, CLEAN CATCH Performed at [facility], [address], [place], [place] [address]    Special Requests      NONE Performed at [facility], [address], [place], [place] [address]    Culture >=100,000 COLONIES/mL ESCHERICHIA COLI (A)    Report Status 04/26/2021 FINAL    Organism ID, Bacteria ESCHERICHIA COLI (A)       Susceptibility   Escherichia coli - MIC*    AMPICILLIN 8 SENSITIVE Sensitive     CEFAZOLIN <=4 SENSITIVE Sensitive     CEFEPIME <=0.12 SENSITIVE Sensitive     CEFTRIAXONE <=0.25 SENSITIVE Sensitive     CIPROFLOXACIN <=0.25 SENSITIVE Sensitive     GENTAMICIN <=1 SENSITIVE Sensitive     IMIPENEM <=0.25 SENSITIVE Sensitive     NITROFURANTOIN <=16 SENSITIVE Sensitive     TRIMETH/SULFA <=20 SENSITIVE Sensitive     AMPICILLIN/SULBACTAM 4 SENSITIVE Sensitive     PIP/TAZO <=4 SENSITIVE Sensitive     * >=100,000 COLONIES/mL ESCHERICHIA COLI  Resp Panel by RT-PCR (Flu A&B, Covid) Nasopharyngeal Swab     Status: None   Collection Time: 04/24/21  9:12 PM   Specimen: Nasopharyngeal Swab; Nasopharyngeal(NP) swabs in vial  transport medium  Result Value Ref Range   SARS Coronavirus 2 by RT PCR NEGATIVE NEGATIVE    Comment: (NOTE) SARS-CoV-2 target nucleic acids are NOT DETECTED.  The SARS-CoV-2 RNA is generally detectable in upper respiratory specimens during the acute phase of infection. The lowest concentration of SARS-CoV-2 viral copies this assay can detect is 138 copies/mL. A negative result does not preclude SARS-Cov-2 infection and should not be used as the sole basis  for treatment or other patient management decisions. A negative result may occur with  improper specimen collection/handling, submission of specimen other than nasopharyngeal swab, presence of viral mutation(s) within the areas targeted by this assay, and inadequate number of viral copies(<138 copies/mL). A negative result must be combined with clinical observations, patient history, and epidemiological information. The expected result is Negative.  Fact Sheet for Patients:  EntrepreneurPulse.com.au  Fact Sheet for Healthcare Providers:  IncredibleEmployment.be  This test is no t yet approved or cleared by the [place] FDA and  has been authorized for detection and/or diagnosis of SARS-CoV-2 by FDA under an Emergency Use Authorization (EUA). This EUA will remain  in effect (meaning this test can be used) for the duration of the COVID-19 declaration under Section 564(b)(1) of the Act, 21 U.S.C.section 360bbb-3(b)(1), unless the authorization is terminated  or revoked sooner.       Influenza A by PCR NEGATIVE NEGATIVE   Influenza B by PCR NEGATIVE NEGATIVE    Comment: (NOTE) The Xpert Xpress SARS-CoV-2/FLU/RSV plus assay is intended as an aid in the diagnosis of influenza from Nasopharyngeal swab specimens and should not be used as a sole basis for treatment. Nasal washings and aspirates are unacceptable for Xpert Xpress SARS-CoV-2/FLU/RSV testing.  Fact Sheet for Patients: EntrepreneurPulse.com.au  Fact Sheet for Healthcare Providers: IncredibleEmployment.be  This test is not yet approved or cleared by the [place] FDA and has been authorized for detection and/or diagnosis of SARS-CoV-2 by FDA under an Emergency Use Authorization (EUA). This EUA will remain in effect (meaning this test can be used) for the duration of the COVID-19 declaration under Section 564(b)(1) of the Act, 21  U.S.C. section 360bbb-3(b)(1), unless the authorization is terminated or revoked.  Performed at [facility], [address] [place], [place] [address]   Lactic acid, plasma     Status: Abnormal   Collection Time: 04/24/21 11:37 PM  Result Value Ref Range   Lactic Acid, Venous 2.9 (HH) 0.5 - 1.9 mmol/L    Comment: CRITICAL RESULT CALLED TO, READ BACK BY AND VERIFIED WITH RACHAEL HARKLESS @0018  ON 04/25/21 SKL Performed at [facility], [address], [place], [place] [address]   CULTURE, BLOOD (ROUTINE X 2) w Reflex to ID Panel     Status: None (Preliminary result)   Collection Time: 04/24/21 11:37 PM   Specimen: BLOOD  Result Value Ref Range   Specimen Description BLOOD LEFT FOREARM    Special Requests      BOTTLES DRAWN AEROBIC AND ANAEROBIC Blood Culture results may not be optimal due to an inadequate volume of blood received in culture bottles   Culture      NO GROWTH 2 DAYS Performed at [facility], [address] [place], [place] [address]    Report Status PENDING   CULTURE, BLOOD (ROUTINE X 2) w Reflex to ID Panel     Status: None (Preliminary result)   Collection Time: 04/24/21 11:37 PM   Specimen: BLOOD  Result Value Ref Range   Specimen Description  BLOOD RIGHT ASSIST CONTROL    Special Requests      BOTTLES DRAWN AEROBIC AND ANAEROBIC Blood Culture adequate volume   Culture      NO GROWTH 2 DAYS Performed at [facility], [address] [place], [place] [address]    Report Status PENDING   Glucose, capillary     Status: Abnormal   Collection Time: 04/25/21 12:40 AM  Result Value Ref Range   Glucose-Capillary 116 (H) 70 - 99 mg/dL    Comment: Glucose reference range applies only to samples taken after fasting for at least 8 hours.  MRSA Next Gen by PCR, Nasal     Status: None   Collection Time: 04/25/21 12:50 AM   Specimen: Nasal Mucosa; Nasal Swab  Result Value Ref Range   MRSA by PCR Next Gen NOT DETECTED NOT  DETECTED    Comment: (NOTE) The GeneXpert MRSA Assay (FDA approved for NASAL specimens only), is one component of a comprehensive MRSA colonization surveillance program. It is not intended to diagnose MRSA infection nor to guide or monitor treatment for MRSA infections. Test performance is not FDA approved in patients less than 58 years old. Performed at [facility], [address] [place], [place] [address]   Protime-INR     Status: Abnormal   Collection Time: 04/25/21  2:18 AM  Result Value Ref Range   Prothrombin Time 16.7 (H) 11.4 - 15.2 seconds   INR 1.4 (H) 0.8 - 1.2    Comment: (NOTE) INR goal varies based on device and disease states. Performed at [facility], [address], [place] [address]   Cortisol-am, blood     Status: Abnormal   Collection Time: 04/25/21  2:18 AM  Result Value Ref Range   Cortisol - AM 82.4 (H) 6.7 - 22.6 ug/dL    Comment: RESULTS CONFIRMED BY MANUAL DILUTION Performed at [facility], [place] [address] [place],  [address]   Procalcitonin     Status: None   Collection Time: 04/25/21  2:18 AM  Result Value Ref Range   Procalcitonin 0.44 ng/mL    Comment:        Interpretation: PCT (Procalcitonin) <= 0.5 ng/mL: Systemic infection (sepsis) is not likely. Local bacterial infection is possible. (NOTE)       Sepsis PCT Algorithm           Lower Respiratory Tract                                      Infection PCT Algorithm    ----------------------------     ----------------------------         PCT < 0.25 ng/mL                PCT < 0.10 ng/mL          Strongly encourage             Strongly discourage   discontinuation of antibiotics    initiation of antibiotics    ----------------------------     -----------------------------       PCT 0.25 - 0.50 ng/mL            PCT 0.10 - 0.25 ng/mL               OR       >80% decrease in PCT  Discourage initiation of                                             antibiotics      Encourage discontinuation           of antibiotics    ----------------------------     -----------------------------         PCT >= 0.50 ng/mL              PCT 0.26 - 0.50 ng/mL               AND        <80% decrease in PCT             Encourage initiation of                                             antibiotics       Encourage continuation           of antibiotics    ----------------------------     -----------------------------        PCT >= 0.50 ng/mL                  PCT > 0.50 ng/mL               AND         increase in PCT                  Strongly encourage                                      initiation of antibiotics    Strongly encourage escalation           of antibiotics                                     -----------------------------                                           PCT <= 0.25 ng/mL                                                 OR                                        > 80% decrease in PCT                                      Discontinue / Do not initiate  antibiotics  Performed at [facility], [address] [place], [place] [address]   Basic metabolic panel     Status: Abnormal   Collection Time: 04/25/21  2:18 AM  Result Value Ref Range   Sodium 136 135 - 145 mmol/L   Potassium 3.6 3.5 - 5.1 mmol/L   Chloride 104 98 - 111 mmol/L   CO2 22 22 - 32 mmol/L   Glucose, Bld 113 (H) 70 - 99 mg/dL    Comment: Glucose reference range applies only to samples taken after fasting for at least 8 hours.   BUN 23 8 - 23 mg/dL   Creatinine, Ser 1.13 0.61 - 1.24 mg/dL   Calcium 9.2 8.9 - 10.3 mg/dL   GFR, Estimated >60 >60 mL/min    Comment: (NOTE) Calculated using the CKD-EPI Creatinine Equation (2021)    Anion gap 10 5 - 15    Comment: Performed at [facility], [address] [place], [place] [address]  CBC     Status: Abnormal   Collection Time: 04/25/21  2:18 AM   Result Value Ref Range   WBC 8.8 4.0 - 10.5 K/uL   RBC 4.18 (L) 4.22 - 5.81 MIL/uL   Hemoglobin 12.8 (L) 13.0 - 17.0 g/dL   HCT 39.2 39.0 - 52.0 %   MCV 93.8 80.0 - 100.0 fL   MCH 30.6 26.0 - 34.0 pg   MCHC 32.7 30.0 - 36.0 g/dL   RDW 13.3 11.5 - 15.5 %   Platelets 145 (L) 150 - 400 K/uL   nRBC 0.0 0.0 - 0.2 %    Comment: Performed at [facility], [address] [place], [place] [address]  Lactic acid, plasma     Status: Abnormal   Collection Time: 04/25/21  2:18 AM  Result Value Ref Range   Lactic Acid, Venous 2.5 (HH) 0.5 - 1.9 mmol/L    Comment: CRITICAL VALUE NOTED. VALUE IS CONSISTENT WITH PREVIOUSLY REPORTED/CALLED VALUE SKL Performed at [facility], [place], [place] [address]   Troponin I (High Sensitivity)     Status: Abnormal   Collection Time: 04/25/21  2:18 AM  Result Value Ref Range   Troponin I (High Sensitivity) 89 (H) <18 ng/L    Comment: RESULT CALLED TO, READ BACK BY AND VERIFIED WITH:  JASMINE FORIANO @0300  ON 04/25/21 SKL (NOTE) Elevated high sensitivity troponin I (hsTnI) values and significant  changes across serial measurements may suggest ACS but many other  chronic and acute conditions are known to elevate hsTnI results.  Refer to the Links section for chest pain algorithms and additional  guidance. Performed at [facility], [address] [place], [place] [address] CORRECTED ON 12/02 AT 0304: PREVIOUSLY REPORTED AS 89 ELECTROLYTES REPEATED TO VERIFY JASMINE FORIANO @0300  ON 04/25/21 SKL   Troponin I (High Sensitivity)     Status: Abnormal   Collection Time: 04/25/21  9:13 AM  Result Value Ref Range   Troponin I (High Sensitivity) 90 (H) <18 ng/L    Comment: (NOTE) Elevated high sensitivity troponin I (hsTnI) values and significant  changes across serial measurements may suggest ACS but many other  chronic and acute conditions are known to elevate hsTnI results.  Refer to the "Links" section for  chest pain algorithms and additional  guidance. Performed at [facility], [address] [place], [place] [address]   Lactic acid, plasma     Status: Abnormal   Collection Time: 04/25/21  9:13 AM  Result Value Ref Range   Lactic Acid, Venous 2.4 (HH) 0.5 - 1.9 mmol/L    Comment: CRITICAL VALUE NOTED. VALUE IS CONSISTENT WITH PREVIOUSLY REPORTED/CALLED VALUE MJU Performed at [facility], [place], [place] [address]   Troponin I (High Sensitivity)     Status: Abnormal   Collection Time: 04/25/21 10:50 AM  Result Value Ref Range   Troponin I (High Sensitivity) 83 (H) <18 ng/L    Comment: (NOTE) Elevated high sensitivity troponin I (hsTnI) values and significant  changes across serial measurements may suggest ACS but many other  chronic and acute conditions are known to elevate hsTnI results.  Refer to the "Links" section for chest pain algorithms and additional  guidance. Performed at [facility], [address] [place], [place] [address]     DG Chest 2 View  Result Date: 04/24/2021 CLINICAL DATA:  Multiple falls.  Right rib pain. EXAM: CHEST - 2 VIEW COMPARISON:  07/22/2020 FINDINGS: Pacer with leads at right atrium and right ventricle. No lead discontinuity. Remote right rib fractures. Bilateral remote rib fractures. No acute displaced fracture identified. Midline trachea. Normal heart size. Transverse aortic dilatation was detailed on the CTA of 11/08/2020. Question increase in soft tissue fullness since prior plain film of 07/22/2020. No pleural effusion or pneumothorax. Scarring in the medial left lower lobe. IMPRESSION: No acute posttraumatic deformity identified. Known thoracic aortic aneurysm, as on 11/07/2020 CT. Question increased soft tissue fullness in this region since prior plain film of 07/22/2020. If suspicion of acute complication, consider repeat CTA. Electronically Signed   By: Abigail Miyamoto M.D.   On: 04/24/2021 14:55    DG Tibia/Fibula Right  Result Date: 04/25/2021 CLINICAL DATA:  Altered mental status and recurrent falls. EXAM: RIGHT TIBIA AND FIBULA - 2 VIEW COMPARISON:  None. FINDINGS: An acute versus subacute, very mildly displaced fracture deformity is seen involving the shaft of the proximal right fibula. There is no evidence of dislocation. Soft tissues are unremarkable. IMPRESSION: Acute versus subacute fracture of the proximal right fibula. Electronically Signed   By: Virgina Norfolk M.D.   On: 04/25/2021 00:11   CT HEAD WO CONTRAST  Result Date: 04/24/2021 CLINICAL DATA:  Head trauma EXAM: CT HEAD WITHOUT CONTRAST TECHNIQUE: Contiguous axial images were obtained from the base of the skull through the vertex without intravenous contrast. COMPARISON:  CT head 04/04/2021 FINDINGS: Brain: No acute intracranial hemorrhage, mass effect, or herniation. No extra-axial fluid collections. No evidence of acute territorial infarct. No hydrocephalus. Extensive hypodensities throughout the periventricular and subcortical white matter, likely secondary to advanced chronic microvascular ischemic changes. 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 Vascular: No hyperdense vessel or unexpected calcification. Skull: Normal. Negative for fracture or focal lesion. Sinuses/Orbits: Mild mucosal thickening in the paranasal sinuses and mild opacification of the mastoid air cells. Other: None. IMPRESSION: No acute intracranial process identified. Multiple chronic findings as described. Electronically Signed   By: Ofilia Neas M.D.   On: 04/24/2021 14:49   CT Cervical Spine Wo Contrast  Result Date: 04/24/2021 CLINICAL DATA:  Trauma EXAM: CT CERVICAL SPINE WITHOUT CONTRAST TECHNIQUE: Multidetector CT imaging of the cervical spine was performed without intravenous contrast. Multiplanar CT image reconstructions were also generated. COMPARISON:  None. FINDINGS: Alignment: Grade 1 anterolisthesis of C2 on  C3. Skull base and vertebrae: No acute fracture. No primary bone lesion or focal pathologic process. Soft tissues and spinal canal: No prevertebral fluid or swelling. No visible canal  hematoma. Disc levels: Moderate to severe intervertebral disc height loss at multiple levels from C3 through T1. Associated endplate sclerosis, cysts, uncovertebral spurring and small dorsal endplate osteophytes. Mild facet arthropathy bilaterally. Multilevel neural foraminal narrowing most significant at C5-C6 bilaterally. Likely multilevel mild-to-moderate spinal canal stenoses. Upper chest: Emphysematous changes in the visualized upper lungs. Other: None. IMPRESSION: No acute fracture or subluxation identified. Advanced degenerative changes. Electronically Signed   By: Ofilia Neas M.D.   On: 04/24/2021 14:52   CT Angio Chest/Abd/Pel for Dissection W and/or Wo Contrast  Result Date: 04/24/2021 CLINICAL DATA:  Known worsening thoracic aortic aneurysm EXAM: CT ANGIOGRAPHY CHEST, ABDOMEN AND PELVIS TECHNIQUE: Non-contrast CT of the chest was initially obtained. Multidetector CT imaging through the chest, abdomen and pelvis was performed using the standard protocol during bolus administration of intravenous contrast. Multiplanar reconstructed images and MIPs were obtained and reviewed to evaluate the vascular anatomy. CONTRAST:  70mL OMNIPAQUE IOHEXOL 350 MG/ML SOLN COMPARISON:  Similar study from 5 hours previous. FINDINGS: CTA CHEST FINDINGS Cardiovascular: No significant cardiac enlargement is noted. Pacing device is again noted and stable. Pulmonary artery as visualized is within normal limits. The ascending thoracic aorta again appears within normal limits. Some turbulence in the descending thoracic aorta shows poor admixing of contrast and blood. No new dissection flap is seen. 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 There has  however been increase in the degree of enhancement in the saccular aneurysm consistent with some worsening mural hematoma. This area now measures 5.9 x 3.6 cm (previously measuring 5.3 x 3.2 cm) the more distal descending thoracic aorta shows some admixture abnormalities although no true dissection flap is seen. Mediastinum/Nodes: Thoracic inlet is within normal limits. Esophagus is unremarkable. No sizable hilar or mediastinal adenopathy is noted. Lungs/Pleura: Emphysematous changes are seen. Stable scarring in the lung bases is noted. No new focal abnormality is identified from the earlier CT. Musculoskeletal: Old healed rib fractures are noted on the left. Stable fractures on the right are noted involving the fifth and sixth ribs anterior laterally. T12 compression fracture is noted stable in appearance from the recent exam. Review of the MIP images confirms the above findings. CTA ABDOMEN AND PELVIS FINDINGS VASCULAR Aorta: Aortic stent graft is noted in place. Patent flow through the stent graft is identified into the external iliac artery on the left and distal common iliac artery on the right. The excluded aneurysm sac is stable in appearance when compared with the prior study. No new endoleak is identified. Celiac: Celiac axis is widely patent. SMA: SMA is stable in appearance. No evidence of thrombosis or embolism is seen. Renals: Single renal arteries are identified bilaterally. No focal stenosis is seen. IMA: Occluded at its origin secondary to the stent graft. Reconstitution of distal branches is noted. Inflow: Iliacs are patent via a runoff from the stent graft. Stenting in the right external iliac artery is seen. Common femoral artery is within normal limits bilaterally. Right internal iliac artery is patent. The left internal iliac artery is occluded at its origin also stable from the prior study. Veins: No specific venous abnormality is noted. Review of the MIP images confirms the above findings.  NON-VASCULAR Hepatobiliary: No focal liver abnormality is seen. No gallstones, gallbladder wall thickening, or biliary dilatation. Pancreas: Unremarkable. No pancreatic ductal dilatation or surrounding inflammatory changes. Spleen: Normal in size without focal abnormality. Adrenals/Urinary Tract: Adrenal glands are within normal  limits. Kidneys are within normal limits with the exception of a large left renal cyst stable from the prior exam. Scattered vascular calcifications are seen. No definitive renal stones are seen. Ureters appear within normal limits. The bladder is partially distended with contrast from prior CT. Stomach/Bowel: No obstructive or inflammatory changes of the bowel are seen. Lymphatic: No sizable lymphadenopathy is noted. Reproductive: Prostate is unremarkable. Other: No abdominal wall hernia or abnormality. No abdominopelvic ascites. Musculoskeletal: No acute or significant osseous findings. Review of the MIP images confirms the above findings. 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 Some slight increase in the degree of opacification of aneurysm is seen consistent with slight enlarging mural aneurysm. No new dissection is seen. Right fifth and sixth rib fractures stable from recent exam. T12 compression deformity also stable from the recent exam. CTA of the abdomen and pelvis: Known aortic stent graft which is widely patent. No occlusion is noted. No new vascular abnormality to correspond with the patient's given clinical history is seen. No new visceral abnormality is identified to correspond with the increase in abdominal pain. Electronically Signed   By: Inez Catalina M.D.   On: 04/24/2021 23:00   CT Angio Chest/Abd/Pel for Dissection W and/or Wo Contrast  Result Date: 04/24/2021 CLINICAL DATA:  Falls. Chest radiograph suggests increased soft tissue in the region of the known thoracic aortic  aneurysm. EXAM: CT ANGIOGRAPHY CHEST, ABDOMEN AND PELVIS TECHNIQUE: Non-contrast CT of the chest was initially obtained. Multidetector CT imaging through the chest, abdomen and pelvis was performed using the standard protocol during bolus administration of intravenous contrast. Multiplanar reconstructed images and MIPs were obtained and reviewed to evaluate the vascular anatomy. CONTRAST:  39mL OMNIPAQUE IOHEXOL 350 MG/ML SOLN COMPARISON:  Current chest radiograph. Chest, abdomen and pelvis CTA, 11/07/2020. FINDINGS: CTA CHEST FINDINGS Cardiovascular: As suggested on the current chest radiograph, there has been a change in the appearance of the aneurysm of the aortic arch. At the level of the focal bulge of the aortic arch, just distal to the left subclavian artery origin, there is now increased soft tissue attenuation that is contiguous with the wall, average Hounsfield units 22. Also, there is contrast that extends into this soft tissue at the level of the focal aortic bulge, consistent with arterial blood dissecting into the mural thrombus/mural hematoma. This soft tissue measures 1.8 cm in thickness along the lateral wall the distal arch, measuring only a few mm on the prior CT, and measures approximally 5.4 x 3.3 cm inferior to the focal bulge, where it measured approximally 3.6 x 2.7 cm previously. This is suspected to be mural hemorrhage has increased from prior exam. The ascending thoracic aorta measures 3.2 cm in diameter. At the level of the aneurysm of the distal arch the lumen now measures 3.8 cm, 3.6 cm previously. At the level of the inferior focal bulge of the distal arch, on the sagittal reconstructed images, the lumen measures 4 cm, previously 3.9 cm. Overall thoracic aorta at the level of the saccular aneurysm, including the wall and hematoma now measures 5.5 cm on the sagittal reconstructed image. The descending thoracic aorta, from wall to wall, measures 4.4 cm, above the level of the hiatus,  without significant change. No aortic dissection. Heart is normal in size and configuration.  No pericardial effusion. Mediastinum/Nodes: No neck base, mediastinal or hilar masses or enlarged lymph nodes. Trachea and esophagus are unremarkable. Lungs/Pleura: Mild linear  atelectasis and/or scarring at the lung bases. Centrilobular emphysema. No evidence of pneumonia or pulmonary edema. No pleural effusion or pneumothorax. No significant change. Musculoskeletal: Mild compression fracture of T12, new since the prior CT. There is slight posterior contour bulging of the vertebra, but no retropulsed fragment or significant encroachment upon the spinal. There are recent fractures of the lateral right fifth and sixth ribs. There may be additional fractures, with this assessment limited by motion. No other fractures.  No bone lesions. Review of the MIP images confirms the above findings. CTA ABDOMEN AND PELVIS FINDINGS VASCULAR Aorta: Previous exclusion of an abdominal aortic aneurysm with a stent graft, unchanged compared to the prior CT native aneurysm measuring 4.1 cm anterior-posterior by 5 cm transversely. Aortic lumen remains widely patent. Celiac: Patent without evidence of aneurysm, dissection, vasculitis or significant stenosis. SMA: Patent without evidence of aneurysm, dissection, vasculitis or significant stenosis. Renals: Images are degraded by patient motion. No convincing significant renal artery stenosis or change from the prior CTA. IMA: Excluded at its origin with collateral flow again noted, unchanged from the prior CTA. Inflow: Iliac artery stent grafts are widely patent. Veins: No obvious venous abnormality within the limitations of this arterial phase study. Review of the MIP images confirms the above findings. NON-VASCULAR Hepatobiliary: No focal liver abnormality is seen. No gallstones, gallbladder wall thickening, or biliary dilatation. Pancreas: Unremarkable. No pancreatic ductal dilatation or  surrounding inflammatory changes. Spleen: Normal in size without focal abnormality. Adrenals/Urinary Tract: No adrenal masses. Left renal cyst. Small nonobstructing stone in the lower pole the right kidney suggested. No hydronephrosis. Ureters not well visualized, but no evidence of ureteral dilation or stone. Bladder is unremarkable. Stomach/Bowel: Bowel assessment somewhat limited by motion. Stomach is unremarkable. Small bowel and colon are normal in caliber. No wall thickening convincing inflammation. Lymphatic: No enlarged lymph nodes. Reproductive: Unremarkable. Other: No abdominal wall hernia.  No ascites. Musculoskeletal: No fracture or bone lesion. Review of the MIP images confirms the above findings. IMPRESSION: CTA FINDINGS 1. 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 This increased soft tissue is consistent with an enlarging mural hematoma. A small amount arterial contrast extends into this presumed hematoma, just inferior to the saccular portion of the aneurysm. 2. Other findings of the thoracoabdominal aorta, including the excluded infrarenal abdominal aortic aneurysm, are without significant change from the prior CTA. NON CTA FINDINGS 1. Recent, mild compression fracture of T12, not present on the prior CTA. Patient's chest radiographic history reports multiple falls and right rib pain. T12 fracture is therefore suspected to be acute. 2. Recent/acute fractures of the right lateral fifth and sixth ribs. There may be other rib fractures, but this assessment is limited by respiratory motion. 3. No other nonvascular acute abnormalities within the chest, abdomen or pelvis. Electronically Signed   By: Lajean Manes M.D.   On: 04/24/2021 17:54   DG HIP UNILAT WITH PELVIS 2-3 VIEWS LEFT  Result Date: 04/24/2021 CLINICAL DATA:  Fall, left hip pain EXAM: DG HIP (WITH OR WITHOUT PELVIS) 2-3V LEFT COMPARISON:  CT abdomen/pelvis  07/23/2020 FINDINGS: There is no acute fracture or dislocation. Femoroacetabular alignment is maintained. The SI joints and symphysis pubis are intact. Vascular stents are noted. IMPRESSION: No acute fracture or dislocation. Electronically Signed   By: Valetta Mole M.D.   On: 04/24/2021 14:54   CT Maxillofacial Wo Contrast  Result Date: 04/24/2021 CLINICAL DATA:  Facial trauma EXAM: CT MAXILLOFACIAL  WITHOUT CONTRAST TECHNIQUE: Multidetector CT imaging of the maxillofacial structures was performed. Multiplanar CT image reconstructions were also generated. COMPARISON:  CT maxillofacial 04/04/2021 FINDINGS: Osseous: No acute fracture or dislocation identified, given limitations of motion. No significant change since previous study visualized. Orbits: No acute process. Sinuses: No air-fluid levels. Mild chronic appearing mucosal thickening/mucous retention cysts in the ethmoid and left maxillary sinus. Soft tissues: Right maxillary soft tissue swelling/hematoma. Limited intracranial: No acute abnormality visualized. IMPRESSION: 1. No acute fracture identified. 2. Right infraorbital maxillary soft tissue swelling/hematoma. Electronically Signed   By: Ofilia Neas M.D.   On: 04/24/2021 14:56    Review of Systems  All other systems reviewed and are negative. Blood pressure (!) 169/80, pulse 77, temperature 98.1 F (36.7 C), temperature source Oral, resp. rate 17, height 5\' 3"  (1.6 m), weight 63.5 kg, SpO2 96 %. Physical Exam Constitutional:      Appearance: He is underweight.  HENT:     Head: Normocephalic. Contusion present.     Jaw: There is normal jaw occlusion.      Comments: Contusion     Nose: Nose normal.  Eyes:     Extraocular Movements: Extraocular movements intact.     Conjunctiva/sclera: Conjunctivae normal.     Pupils: Pupils are equal, round, and reactive to light.  Cardiovascular:     Rate and Rhythm: Normal rate and regular rhythm.  Pulmonary:     Effort: Pulmonary effort is  normal.     Breath sounds: Normal breath sounds.  Abdominal:     General: Abdomen is flat and scaphoid. Bowel sounds are normal.     Palpations: Abdomen is soft.  Musculoskeletal:        General: Normal range of motion.     Cervical back: Full passive range of motion without pain, normal range of motion and neck supple.  Skin:    General: Skin is warm.  Neurological:     General: No focal deficit present.     Mental Status: He is alert.     Cranial Nerves: Dysarthria present.     Motor: Weakness present.     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  Apparently in a new facility and no family members  Psychiatric:        Mood and Affect: Mood is depressed. Affect is blunt.        Speech: He is noncommunicative.        Behavior: Behavior is cooperative.    Assessment/Plan: Non-Ruptured saccular thoracic aneurysm  Abdominal aortic aneurysm stent with possible type II leak. Current UTI Malnutrition Will recommend carotid duplex to assess for carotid artery stenosis due to possible change in mental status and fall.  He will need good control of BP to keep SBP <140 mmHg. He will need to be transferred for elective thoracic aneurysm repair with debranching. Elmore Guise 04/26/2021, 10:36 AM

## 2021-04-26 NOTE — Progress Notes (Signed)
Messaged Dr. Sidney Ace who is on call physician for this patient regarding Esmolol gtt BP parameters. Verbal order from Dr. Sidney Ace for BP to remain under 073 systolic until further specifications by day shift team. Esmolol gtt resumed at 29mcg/kg/min, will titrate accordingly.

## 2021-04-26 NOTE — Progress Notes (Addendum)
PROGRESS NOTE    James Dickerson  DPO:[id number] DOB: 07/23/1940 DOA: 04/24/2021 PCP: Pcp, No   Brief Narrative: Taken from H&P. James Dickerson is a 80 y.o. African-American male with medical history significant for COPD, depression, GERD, dyslipidemia and hypertension, who presented to the ER with acute onset of altered mental status with recent recurrent falls.  He had a subsequent contusion to his right cheek.  Patient was recently moved to a different group home on Wednesday.  On presentation to ED patient had markedly elevated blood pressure, tachycardic mom, mildly tachypneic, labs only pertinent for mild hypokalemia.  COVID-19 and influenza PCR negative.  UA concerning for UTI.  Multiple imaging for concern of falls and injuries shows multiple fractures which include right fifth and sixth rib, T12, and right proximal fibula.  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  A small amount of arterial contrast extended into the presumed hematoma, concern for leaking aneurysm.  Vascular surgery was consulted and they are recommending conservative management with blood pressure control per admitting provider note, has not seen any notes from vascular surgery yet. He was started on esmolol infusion for better control of hypertension. CT head, cervical spine and sinuses were without any acute findings.  Orthopedic was consulted this morning, notify Dr. Mack Guise about right proximal fibular fracture.  Talked with daughter today and according to her patient has very poor functional status at baseline, bed bound most of the time, not eating and drinking.  They moved him to a different group home recently.  He has 2 daughters, 1 lives in [place].  It looks like family is not much involved in his care.  Discussed his full code with her and with her  permission changed status to DNR with full scope of medical care as performing a CPR on him will be more damaging and fruitless.  Extremely malnourished elderly man, very poor functional status, aphasic, poor p.o. intake and advanced dementia at baseline, very high risk for deterioration and death.  Palliative care was also consulted.  12/3: Vascular surgery per their recommendations today, stating that patient needs to transfer to [facility] for a possible thoracic saccular aneurysm repair.  Contacted the [facility] transfer line.  They gave me the name of Dr. Mali Hughes at [facility]. Patient was more alert and able to participate with feeding and taking meds today.  He started taking some p.o. intake. 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  4:16 PM: Received a call back from [facility] cardiothoracic team stating that they reviewed the imaging and there is no acute need to perform the surgery, he needs to follow-up with Dr. Ysidro Evert as an outpatient.  Subjective: Patient was more alert and trying to speak, stating fine and thank you.  Remained with very muffled voice.  Denies any chest pain.  Assessment & Plan:   Principal Problem:   Altered mental status Active Problems:   Thoracic aortic aneurysm (TAA)   Lower urinary tract infectious disease   Contusion  Altered mental status with recent recurrent falls/multiple fractures.  Resulted in multiple fractures which include right fifth and sixth rib, T12 and right proximal fibula.  Patient has an history of advanced dementia so not sure  how much mental status is changed from baseline.  Very poor functional status, mostly bedbound and poor p.o. intake for a while with declining health per daughter. -We will obtain PT/OT evaluation once more stable. -Orthopedic surgery was consulted-and they are recommending  conservative management. -Vascular surgery ordered carotid vascular ultrasound.  Sepsis.  Patient met sepsis criteria with tachycardia, tachypnea and a presumed UTI.  Preliminary blood cultures negative so far.  Urine cultures pending.  Procalcitonin elevated at 0.44 and mildly elevated lactic acid at 2.5 >>2.4.  Urine culture with pansensitive E. Coli. 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  Vascular surgery was consulted from ED, no formal note yet but according to admitting provider note that they advised conservative management with blood pressure control.  He was started on esmolol infusion. -Continue with esmolol infusion to keep the systolic below 601. -Continue home dose of amlodipine and Diovan.  -Patient need to transfer to [facility] for a potential saccular aortic arch aneurysm repair as it is leaking and high risk for rupture.  Called [facility] transfer line-awaiting response from their cardiothoracic surgery, Dr. Mali Hughes team as advised by our vascular surgeons.  History of prior CVA.  CT head negative for any acute findings.  -Continue home dose of statin -Swallow team recommended dysphagia 2 diet.  Hypokalemia.  Resolved -Monitor potassium and replete as needed.  Hypertensive urgency.  Blood pressure improving on esmolol infusion. -Continue with esmolol infusion -Continue with home meds  Severe protein caloric malnutrition.  Severe muscle wasting and poor p.o. intake. -Dietitian consult  Elevated troponin.  Mildly elevated troponin, most likely secondary to demand ischemia.  No chest pain.  Troponin with a flat curve.  Dyslipidemia. -Continue with statin  Dementia. -Continue with home dose of Aricept  History of overactive bladder. Continue home dose of Myrbetriq  BPH. -Continue home dose of Flomax  COPD without exacerbation. -Continue as needed bronchodilators  GERD. -Continue PPI  Objective: Vitals:    04/26/21 0800 04/26/21 1000 04/26/21 1114 04/26/21 1300  BP: (!) 169/80 (!) 163/91 (!) 146/83 111/69  Pulse:    70  Resp: 17     Temp: 98.1 F (36.7 C)     TempSrc: Oral     SpO2: 96%   96%  Weight:      Height:        Intake/Output Summary (Last 24 hours) at 04/26/2021 1448 Last data filed at 04/26/2021 1401 Gross per 24 hour  Intake 1088.16 ml  Output 150 ml  Net 938.16 ml    Filed Weights   04/24/21 1338  Weight: 63.5 kg    Examination:  General.  Frail and malnourished elderly man, in no acute distress. Pulmonary.  Lungs clear bilaterally, normal respiratory effort. CV.  Regular rate and rhythm, no JVD, rub or murmur. Abdomen.  Soft, nontender, nondistended, BS positive. CNS.  Alert and oriented .  No focal neurologic deficit. Extremities.  No edema, no cyanosis, pulses intact and symmetrical. Psychiatry.  Judgment and insight appears somewhat impaired.  DVT prophylaxis: SCDs, patient has leaking thoracic aneurysm with mural thrombus Code Status: DNR-discussed with daughter and changed the status from full code to DNR with full scope of medical care Family Communication:  Disposition Plan:  Status is: Inpatient  Remains inpatient appropriate because: Severity of illness.  Level of care: Stepdown  All the records are reviewed and case discussed with Care Management/Social Worker. Management plans discussed with the patient, nursing and they are in agreement.  Consultants:  Vascular surgery Orthopedic surgery  Procedures:  Antimicrobials:  Ceftriaxone  Data Reviewed: I have personally reviewed following labs and imaging studies  CBC: Recent Labs  Lab 04/24/21 1341 04/25/21 0218  WBC 8.7 8.8  HGB 12.5* 12.8*  HCT 38.0* 39.2  MCV 93.6 93.8  PLT 154 145*    Basic Metabolic Panel: Recent Labs  Lab 04/24/21 1341 04/25/21 0218  NA 135 136  K 3.2* 3.6  CL 101 104  CO2 26 22  GLUCOSE 117* 113*  BUN 21 23  CREATININE 1.19 1.13  CALCIUM 9.2 9.2     GFR: Estimated Creatinine Clearance: 41.3 mL/min (by C-G formula based on SCr of 1.13 mg/dL). Liver Function Tests: No results for input(s): AST, ALT, ALKPHOS, BILITOT, PROT, ALBUMIN in the last 168 hours. No results for input(s): LIPASE, AMYLASE in the last 168 hours. No results for input(s): AMMONIA in the last 168 hours. Coagulation Profile: Recent Labs  Lab 04/25/21 0218  INR 1.4*    Cardiac Enzymes: No results for input(s): CKTOTAL, CKMB, CKMBINDEX, TROPONINI in the last 168 hours. BNP (last 3 results) No results for input(s): PROBNP in the last 8760 hours. HbA1C: No results for input(s): HGBA1C in the last 72 hours. CBG: Recent Labs  Lab 04/25/21 0040  GLUCAP 116*    Lipid Profile: No results for input(s): CHOL, HDL, LDLCALC, TRIG, CHOLHDL, LDLDIRECT in the last 72 hours. Thyroid Function Tests: No results for input(s): TSH, T4TOTAL, FREET4, T3FREE, THYROIDAB in the last 72 hours. Anemia Panel: No results for input(s): VITAMINB12, FOLATE, FERRITIN, TIBC, IRON, RETICCTPCT in the last 72 hours. Sepsis Labs: Recent Labs  Lab 04/24/21 2337 04/25/21 0218 04/25/21 0913  PROCALCITON  --  0.44  --   LATICACIDVEN 2.9* 2.5* 2.4*     Recent Results (from the past 240 hour(s))  Urine Culture     Status: Abnormal   Collection Time: 04/24/21  7:16 PM   Specimen: Urine, Clean Catch  Result Value Ref Range Status   Specimen Description   Final    URINE, CLEAN CATCH Performed at [facility], [address], [place], [place] [address]    Special Requests   Final    NONE Performed at [facility], [address] [place], [place] [address]    Culture >=100,000 COLONIES/mL ESCHERICHIA COLI (A)  Final   Report Status 04/26/2021 FINAL  Final   Organism ID, Bacteria ESCHERICHIA COLI (A)  Final      Susceptibility   Escherichia coli - MIC*    AMPICILLIN 8 SENSITIVE Sensitive     CEFAZOLIN <=4 SENSITIVE Sensitive     CEFEPIME <=0.12 SENSITIVE  Sensitive     CEFTRIAXONE <=0.25 SENSITIVE Sensitive     CIPROFLOXACIN <=0.25 SENSITIVE Sensitive     GENTAMICIN <=1 SENSITIVE Sensitive     IMIPENEM <=0.25 SENSITIVE Sensitive     NITROFURANTOIN <=16 SENSITIVE Sensitive     TRIMETH/SULFA <=20 SENSITIVE Sensitive     AMPICILLIN/SULBACTAM 4 SENSITIVE Sensitive     PIP/TAZO <=4 SENSITIVE Sensitive     * >=100,000 COLONIES/mL ESCHERICHIA COLI  Resp Panel by RT-PCR (Flu A&B, Covid) Nasopharyngeal Swab     Status: None   Collection Time: 04/24/21  9:12 PM   Specimen: Nasopharyngeal Swab; Nasopharyngeal(NP) swabs in vial transport medium  Result Value Ref Range Status   SARS Coronavirus 2 by RT PCR NEGATIVE NEGATIVE Final    Comment: (NOTE) SARS-CoV-2 target nucleic acids are NOT DETECTED.  The SARS-CoV-2 RNA is generally detectable  in upper respiratory specimens during the acute phase of infection. The lowest concentration of SARS-CoV-2 viral copies this assay can detect is 138 copies/mL. A negative result does not preclude SARS-Cov-2 infection and should not be used as the sole basis for treatment or other patient management decisions. A negative result may occur with  improper specimen collection/handling, submission of specimen other than nasopharyngeal swab, presence of viral mutation(s) within the areas targeted by this assay, and inadequate number of viral copies(<138 copies/mL). A negative result must be combined with clinical observations, patient history, and epidemiological information. The expected result is Negative.  Fact Sheet for Patients:  EntrepreneurPulse.com.au  Fact Sheet for Healthcare Providers:  IncredibleEmployment.be  This test is no t yet approved or cleared by the [place] FDA and  has been authorized for detection and/or diagnosis of SARS-CoV-2 by FDA under an Emergency Use Authorization (EUA). This EUA will remain  in effect (meaning this test can be used) for the  duration of the COVID-19 declaration under Section 564(b)(1) of the Act, 21 U.S.C.section 360bbb-3(b)(1), unless the authorization is terminated  or revoked sooner.       Influenza A by PCR NEGATIVE NEGATIVE Final   Influenza B by PCR NEGATIVE NEGATIVE Final    Comment: (NOTE) The Xpert Xpress SARS-CoV-2/FLU/RSV plus assay is intended as an aid in the diagnosis of influenza from Nasopharyngeal swab specimens and should not be used as a sole basis for treatment. Nasal washings and aspirates are unacceptable for Xpert Xpress SARS-CoV-2/FLU/RSV testing.  Fact Sheet for Patients: EntrepreneurPulse.com.au  Fact Sheet for Healthcare Providers: IncredibleEmployment.be  This test is not yet approved or cleared by the [place] FDA and has been authorized for detection and/or diagnosis of SARS-CoV-2 by FDA under an Emergency Use Authorization (EUA). This EUA will remain in effect (meaning this test can be used) for the duration of the COVID-19 declaration under Section 564(b)(1) of the Act, 21 U.S.C. section 360bbb-3(b)(1), unless the authorization is terminated or revoked.  Performed at [facility], [address] [place], [place] [address]   CULTURE, BLOOD (ROUTINE X 2) w Reflex to ID Panel     Status: None (Preliminary result)   Collection Time: 04/24/21 11:37 PM   Specimen: BLOOD  Result Value Ref Range Status   Specimen Description BLOOD LEFT FOREARM  Final   Special Requests   Final    BOTTLES DRAWN AEROBIC AND ANAEROBIC Blood Culture results may not be optimal due to an inadequate volume of blood received in culture bottles   Culture   Final    NO GROWTH 2 DAYS Performed at [facility], [address] [place], [place] [address]    Report Status PENDING  Incomplete  CULTURE, BLOOD (ROUTINE X 2) w Reflex to ID Panel     Status: None (Preliminary result)   Collection Time: 04/24/21 11:37 PM   Specimen: BLOOD   Result Value Ref Range Status   Specimen Description BLOOD RIGHT ASSIST CONTROL  Final   Special Requests   Final    BOTTLES DRAWN AEROBIC AND ANAEROBIC Blood Culture adequate volume   Culture   Final    NO GROWTH 2 DAYS Performed at [facility], [address] [place], [place] [address]    Report Status PENDING  Incomplete  MRSA Next Gen by PCR, Nasal     Status: None   Collection Time: 04/25/21 12:50 AM   Specimen: Nasal Mucosa; Nasal Swab  Result Value Ref Range Status   MRSA  by PCR Next Gen NOT DETECTED NOT DETECTED Final    Comment: (NOTE) The GeneXpert MRSA Assay (FDA approved for NASAL specimens only), is one component of a comprehensive MRSA colonization surveillance program. It is not intended to diagnose MRSA infection nor to guide or monitor treatment for MRSA infections. Test performance is not FDA approved in patients less than 7 years old. Performed at [facility], [address] [place], [place] [address]       Radiology Studies: DG Chest 2 View  Result Date: 04/24/2021 CLINICAL DATA:  Multiple falls.  Right rib pain. EXAM: CHEST - 2 VIEW COMPARISON:  07/22/2020 FINDINGS: Pacer with leads at right atrium and right ventricle. No lead discontinuity. Remote right rib fractures. Bilateral remote rib fractures. No acute displaced fracture identified. Midline trachea. Normal heart size. Transverse aortic dilatation was detailed on the CTA of 11/08/2020. Question increase in soft tissue fullness since prior plain film of 07/22/2020. No pleural effusion or pneumothorax. Scarring in the medial left lower lobe. IMPRESSION: No acute posttraumatic deformity identified. Known thoracic aortic aneurysm, as on 11/07/2020 CT. Question increased soft tissue fullness in this region since prior plain film of 07/22/2020. If suspicion of acute complication, consider repeat CTA. Electronically Signed   By: Abigail Miyamoto M.D.   On: 04/24/2021 14:55   DG Tibia/Fibula  Right  Result Date: 04/25/2021 CLINICAL DATA:  Altered mental status and recurrent falls. EXAM: RIGHT TIBIA AND FIBULA - 2 VIEW COMPARISON:  None. FINDINGS: An acute versus subacute, very mildly displaced fracture deformity is seen involving the shaft of the proximal right fibula. There is no evidence of dislocation. Soft tissues are unremarkable. IMPRESSION: Acute versus subacute fracture of the proximal right fibula. Electronically Signed   By: Virgina Norfolk M.D.   On: 04/25/2021 00:11   CT Angio Chest/Abd/Pel for Dissection W and/or Wo Contrast  Result Date: 04/24/2021 CLINICAL DATA:  Known worsening thoracic aortic aneurysm EXAM: CT ANGIOGRAPHY CHEST, ABDOMEN AND PELVIS TECHNIQUE: Non-contrast CT of the chest was initially obtained. Multidetector CT imaging through the chest, abdomen and pelvis was performed using the standard protocol during bolus administration of intravenous contrast. Multiplanar reconstructed images and MIPs were obtained and reviewed to evaluate the vascular anatomy. CONTRAST:  84m OMNIPAQUE IOHEXOL 350 MG/ML SOLN COMPARISON:  Similar study from 5 hours previous. FINDINGS: CTA CHEST FINDINGS Cardiovascular: No significant cardiac enlargement is noted. Pacing device is again noted and stable. Pulmonary artery as visualized is within normal limits. The ascending thoracic aorta again appears within normal limits. Some turbulence in the descending thoracic aorta shows poor admixing of contrast and blood. No new dissection flap is seen. 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 There has however been increase in the degree of enhancement in the saccular aneurysm consistent with some worsening mural hematoma. This area now measures 5.9 x 3.6 cm (previously measuring 5.3 x 3.2 cm) the more distal descending thoracic aorta shows some admixture abnormalities although no true dissection flap is seen.  Mediastinum/Nodes: Thoracic inlet is within normal limits. Esophagus is unremarkable. No sizable hilar or mediastinal adenopathy is noted. Lungs/Pleura: Emphysematous changes are seen. Stable scarring in the lung bases is noted. No new focal abnormality is identified from the earlier CT. Musculoskeletal: Old healed rib fractures are noted on the left. Stable fractures on the right are noted involving the fifth and sixth ribs anterior laterally. T12 compression fracture is noted stable in  appearance from the recent exam. Review of the MIP images confirms the above findings. CTA ABDOMEN AND PELVIS FINDINGS VASCULAR Aorta: Aortic stent graft is noted in place. Patent flow through the stent graft is identified into the external iliac artery on the left and distal common iliac artery on the right. The excluded aneurysm sac is stable in appearance when compared with the prior study. No new endoleak is identified. Celiac: Celiac axis is widely patent. SMA: SMA is stable in appearance. No evidence of thrombosis or embolism is seen. Renals: Single renal arteries are identified bilaterally. No focal stenosis is seen. IMA: Occluded at its origin secondary to the stent graft. Reconstitution of distal branches is noted. Inflow: Iliacs are patent via a runoff from the stent graft. Stenting in the right external iliac artery is seen. Common femoral artery is within normal limits bilaterally. Right internal iliac artery is patent. The left internal iliac artery is occluded at its origin also stable from the prior study. Veins: No specific venous abnormality is noted. Review of the MIP images confirms the above findings. NON-VASCULAR Hepatobiliary: No focal liver abnormality is seen. No gallstones, gallbladder wall thickening, or biliary dilatation. Pancreas: Unremarkable. No pancreatic ductal dilatation or surrounding inflammatory changes. Spleen: Normal in size without focal abnormality. Adrenals/Urinary Tract: Adrenal glands are  within normal limits. Kidneys are within normal limits with the exception of a large left renal cyst stable from the prior exam. Scattered vascular calcifications are seen. No definitive renal stones are seen. Ureters appear within normal limits. The bladder is partially distended with contrast from prior CT. Stomach/Bowel: No obstructive or inflammatory changes of the bowel are seen. Lymphatic: No sizable lymphadenopathy is noted. Reproductive: Prostate is unremarkable. Other: No abdominal wall hernia or abnormality. No abdominopelvic ascites. Musculoskeletal: No acute or significant osseous findings. Review of the MIP images confirms the above findings. 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 Some slight increase in the degree of opacification of aneurysm is seen consistent with slight enlarging mural aneurysm. No new dissection is seen. Right fifth and sixth rib fractures stable from recent exam. T12 compression deformity also stable from the recent exam. CTA of the abdomen and pelvis: Known aortic stent graft which is widely patent. No occlusion is noted. No new vascular abnormality to correspond with the patient's given clinical history is seen. No new visceral abnormality is identified to correspond with the increase in abdominal pain. Electronically Signed   By: Inez Catalina M.D.   On: 04/24/2021 23:00   CT Angio Chest/Abd/Pel for Dissection W and/or Wo Contrast  Result Date: 04/24/2021 CLINICAL DATA:  Falls. Chest radiograph suggests increased soft tissue in the region of the known thoracic aortic aneurysm. EXAM: CT ANGIOGRAPHY CHEST, ABDOMEN AND PELVIS TECHNIQUE: Non-contrast CT of the chest was initially obtained. Multidetector CT imaging through the chest, abdomen and pelvis was performed using the standard protocol during bolus administration of intravenous contrast. Multiplanar reconstructed images and  MIPs were obtained and reviewed to evaluate the vascular anatomy. CONTRAST:  8m OMNIPAQUE IOHEXOL 350 MG/ML SOLN COMPARISON:  Current chest radiograph. Chest, abdomen and pelvis CTA, 11/07/2020. FINDINGS: CTA CHEST FINDINGS Cardiovascular: As suggested on the current chest radiograph, there has been a change in the appearance of the aneurysm of the aortic arch. At the level of the focal bulge of the aortic arch, just distal to the left subclavian artery origin, there is now increased soft tissue attenuation that  is contiguous with the wall, average Hounsfield units 22. Also, there is contrast that extends into this soft tissue at the level of the focal aortic bulge, consistent with arterial blood dissecting into the mural thrombus/mural hematoma. This soft tissue measures 1.8 cm in thickness along the lateral wall the distal arch, measuring only a few mm on the prior CT, and measures approximally 5.4 x 3.3 cm inferior to the focal bulge, where it measured approximally 3.6 x 2.7 cm previously. This is suspected to be mural hemorrhage has increased from prior exam. The ascending thoracic aorta measures 3.2 cm in diameter. At the level of the aneurysm of the distal arch the lumen now measures 3.8 cm, 3.6 cm previously. At the level of the inferior focal bulge of the distal arch, on the sagittal reconstructed images, the lumen measures 4 cm, previously 3.9 cm. Overall thoracic aorta at the level of the saccular aneurysm, including the wall and hematoma now measures 5.5 cm on the sagittal reconstructed image. The descending thoracic aorta, from wall to wall, measures 4.4 cm, above the level of the hiatus, without significant change. No aortic dissection. Heart is normal in size and configuration.  No pericardial effusion. Mediastinum/Nodes: No neck base, mediastinal or hilar masses or enlarged lymph nodes. Trachea and esophagus are unremarkable. Lungs/Pleura: Mild linear atelectasis and/or scarring at the lung bases.  Centrilobular emphysema. No evidence of pneumonia or pulmonary edema. No pleural effusion or pneumothorax. No significant change. Musculoskeletal: Mild compression fracture of T12, new since the prior CT. There is slight posterior contour bulging of the vertebra, but no retropulsed fragment or significant encroachment upon the spinal. There are recent fractures of the lateral right fifth and sixth ribs. There may be additional fractures, with this assessment limited by motion. No other fractures.  No bone lesions. Review of the MIP images confirms the above findings. CTA ABDOMEN AND PELVIS FINDINGS VASCULAR Aorta: Previous exclusion of an abdominal aortic aneurysm with a stent graft, unchanged compared to the prior CT native aneurysm measuring 4.1 cm anterior-posterior by 5 cm transversely. Aortic lumen remains widely patent. Celiac: Patent without evidence of aneurysm, dissection, vasculitis or significant stenosis. SMA: Patent without evidence of aneurysm, dissection, vasculitis or significant stenosis. Renals: Images are degraded by patient motion. No convincing significant renal artery stenosis or change from the prior CTA. IMA: Excluded at its origin with collateral flow again noted, unchanged from the prior CTA. Inflow: Iliac artery stent grafts are widely patent. Veins: No obvious venous abnormality within the limitations of this arterial phase study. Review of the MIP images confirms the above findings. NON-VASCULAR Hepatobiliary: No focal liver abnormality is seen. No gallstones, gallbladder wall thickening, or biliary dilatation. Pancreas: Unremarkable. No pancreatic ductal dilatation or surrounding inflammatory changes. Spleen: Normal in size without focal abnormality. Adrenals/Urinary Tract: No adrenal masses. Left renal cyst. Small nonobstructing stone in the lower pole the right kidney suggested. No hydronephrosis. Ureters not well visualized, but no evidence of ureteral dilation or stone. Bladder is  unremarkable. Stomach/Bowel: Bowel assessment somewhat limited by motion. Stomach is unremarkable. Small bowel and colon are normal in caliber. No wall thickening convincing inflammation. Lymphatic: No enlarged lymph nodes. Reproductive: Unremarkable. Other: No abdominal wall hernia.  No ascites. Musculoskeletal: No fracture or bone lesion. Review of the MIP images confirms the above findings. IMPRESSION: CTA FINDINGS 1. 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 This increased soft tissue is consistent with an enlarging mural  hematoma. A small amount arterial contrast extends into this presumed hematoma, just inferior to the saccular portion of the aneurysm. 2. Other findings of the thoracoabdominal aorta, including the excluded infrarenal abdominal aortic aneurysm, are without significant change from the prior CTA. NON CTA FINDINGS 1. Recent, mild compression fracture of T12, not present on the prior CTA. Patient's chest radiographic history reports multiple falls and right rib pain. T12 fracture is therefore suspected to be acute. 2. Recent/acute fractures of the right lateral fifth and sixth ribs. There may be other rib fractures, but this assessment is limited by respiratory motion. 3. No other nonvascular acute abnormalities within the chest, abdomen or pelvis. Electronically Signed   By: Lajean Manes M.D.   On: 04/24/2021 17:54   DG HIP UNILAT WITH PELVIS 2-3 VIEWS LEFT  Result Date: 04/24/2021 CLINICAL DATA:  Fall, left hip pain EXAM: DG HIP (WITH OR WITHOUT PELVIS) 2-3V LEFT COMPARISON:  CT abdomen/pelvis 07/23/2020 FINDINGS: There is no acute fracture or dislocation. Femoroacetabular alignment is maintained. The SI joints and symphysis pubis are intact. Vascular stents are noted. IMPRESSION: No acute fracture or dislocation. Electronically Signed   By: Valetta Mole M.D.   On: 04/24/2021 14:54    Scheduled Meds:  aspirin EC   81 mg Oral Q0600   atorvastatin  40 mg Oral QHS   Chlorhexidine Gluconate Cloth  6 each Topical Daily   cholecalciferol  2,000 Units Oral Daily   donepezil  10 mg Oral Daily   feeding supplement (NEPRO CARB STEADY)  237 mL Oral TID BM   irbesartan  150 mg Oral Daily   megestrol  40 mg Oral Daily   mirabegron ER  25 mg Oral Daily   multivitamin with minerals  1 tablet Oral Daily   pantoprazole  40 mg Oral BID   tamsulosin  0.4 mg Oral Daily   Continuous Infusions:  cefTRIAXone (ROCEPHIN)  IV Stopped (04/26/21 1053)   esmolol 100 mcg/kg/min (04/26/21 1446)   niCARDipine Stopped (04/25/21 0120)     LOS: 2 days   Time spent: 48 minutes. More than 50% of the time was spent in counseling/coordination of care with consultants and family.  Lorella Nimrod, MD [facility]  If 7PM-7AM, please contact night-coverage [email or web address]  04/26/2021, 2:48 PM   This record has been created using Systems analyst. Errors have been sought and corrected,but may not always be located. Such creation errors do not reflect on the standard of care.

## 2021-04-26 NOTE — Progress Notes (Addendum)
PT Cancellation Note  Patient Details Name: James Dickerson MRN: [id number] DOB: 22-Dec-1940   Cancelled Treatment:    Reason Eval/Treat Not Completed: Patient not medically ready PT orders received, chart reviewed. Vascular was consulted with recommendations to keep systolic BP <366 but pt has consistently been 160s with lowest of 146. Spoke with vascular MD via secure what who agrees with recommendation to hold PT at this time. Will f/u as able & as pt is medically appropriate for PT intervention.  Addendum: PT contacted pt's daughter via telephone [name], [phone]) who recommended PT contacted [name] (at [phone]), which this PT did. [name] reports pt was only at [facility] for < 24 hours prior to admission to the hospital. Prior to transitioning to [facility] reports pt was encouraged to stay in bed because he frequently fell. While at [facility] pt could ambulate with a rollator without physical assistance but would frequently forget to use AD, and when sitting in w/c he would get out of w/c without assistance. [name] reports pt has to be ambulatory to be at [facility].   [name], PT, DPT 04/26/21, 11:58 AM   [name] 04/26/2021, 11:46 AM

## 2021-04-26 NOTE — Consult Note (Addendum)
ORTHOPAEDIC CONSULTATION  REQUESTING PHYSICIAN: Lorella Nimrod, MD  Reason for consultation: Right proximal fibula shaft fracture  HPI: James Dickerson is a 80 y.o. male is in the [place] regional ICU for acute onset of altered mental status with recurrent falls.  Patient normally resides at a group home.  Patient has been noted to have rib fractures, a mild compression fracture at T12 and a right proximal fibular shaft fracture.  Patient is seen in his ICU bed today.  Patient denies pain in the right leg.  Past Medical History:  Diagnosis Date   Back pain    Cardiomegaly    COPD (chronic obstructive pulmonary disease) (HCC)    Cystic kidney disease    Depressive disorder    GERD (gastroesophageal reflux disease)    High cholesterol    History of prostate cancer    Hypertension    Prostate cancer ([place]) 2018   Rad seeds   Thyroid nodule    Past Surgical History:  Procedure Laterality Date   ABDOMINAL AORTIC ENDOVASCULAR STENT GRAFT N/A 06/22/2020   Procedure: ABDOMINAL AORTIC ENDOVASCULAR STENT GRAFT;  Surgeon: Cherre Robins, MD;  Location: [facility];  Service: Vascular;  Laterality: N/A;   [place] N/A 06/22/2020   Procedure: CYSTOSCOPY WITH URETHRAL DILATATION;  Surgeon: Alexis Frock, MD;  Location: [place];  Service: Urology;  Laterality: N/A;   INSERTION OF ILIAC STENT Right 06/22/2020   Procedure: INSERTION OF RIGHT EXTERNAL ILIAC ARTERY STENT;  Surgeon: Cherre Robins, MD;  Location: [place];  Service: Vascular;  Laterality: Right;   INSERTION OF SUPRAPUBIC CATHETER N/A 06/22/2020   Procedure: INSERTION OF SUPRAPUBIC CATHETER;  Surgeon: Alexis Frock, MD;  Location: [place];  Service: Urology;  Laterality: N/A;   PACEMAKER INSERTION N/A 08/06/2016   Procedure: INSERTION PACEMAKER;  Surgeon: Isaias Cowman, MD;  Location: [facility];  Service: Cardiovascular;  Laterality: N/A;   Social History   Socioeconomic History   Marital  status: Single    Spouse name: Not on file   Number of children: Not on file   Years of education: Not on file   Highest education level: Not on file  Occupational History   Not on file  Tobacco Use   Smoking status: Every Day    Packs/day: 0.25    Years: 67.00    Pack years: 16.75    Types: Cigarettes   Smokeless tobacco: Never   Tobacco comments:    3 every other day  Vaping Use   Vaping Use: Never used  Substance and Sexual Activity   Alcohol use: No   Drug use: No   Sexual activity: Not on file  Other Topics Concern   Not on file  Social History Narrative   Not on file   Social Determinants of Health   Financial Resource Strain: Not on file  Food Insecurity: Not on file  Transportation Needs: Not on file  Physical Activity: Not on file  Stress: Not on file  Social Connections: Not on file   History reviewed. No pertinent family history. No Known Allergies Prior to Admission medications   Medication Sig Start Date End Date Taking? Authorizing Provider  albuterol (PROVENTIL) (2.5 MG/3ML) 0.083% nebulizer solution Take 2.5 mg by nebulization every 6 (six) hours as needed. 07/15/20  Yes [provider]  aspirin EC 81 MG EC tablet Take 1 tablet (81 mg total) by mouth daily at 6 (six) AM. Swallow whole. 06/29/20  Yes Risa Grill  J, PA-C  atorvastatin (LIPITOR) 40 MG tablet Take 1 tablet (40 mg total) by mouth at bedtime. 06/17/18  Yes Mayo, Pete Pelt, MD  Cholecalciferol (VITAMIN D3) 50 MCG (2000 UT) CHEW Chew 2,000 Units by mouth daily.   Yes [provider]  donepezil (ARICEPT) 10 MG tablet Take 10 mg by mouth daily. 04/03/21  Yes [provider]  ipratropium-albuterol (DUONEB) 0.5-2.5 (3) MG/3ML SOLN Inhale 3 mLs into the lungs every 6 (six) hours as needed (Wheezing). 01/21/20  Yes [provider]  megestrol (MEGACE) 40 MG tablet Take 40 mg by mouth daily. 04/03/21  Yes [provider]  mirabegron ER (MYRBETRIQ) 25 MG TB24  tablet Take 1 tablet (25 mg total) by mouth daily. 07/27/20  Yes Jennye Boroughs, MD  OXYGEN Inhale 2-3 L into the lungs as needed (at bedtime).   Yes [provider]  pantoprazole (PROTONIX) 40 MG tablet Take 40 mg by mouth 2 (two) times daily. 10/18/19  Yes [provider]  tamsulosin (FLOMAX) 0.4 MG CAPS capsule Take 0.4 mg by mouth daily. 10/14/19  Yes [provider]  valsartan (DIOVAN) 160 MG tablet Take 160 mg by mouth daily. 05/04/18  Yes [provider]  amLODipine (NORVASC) 5 MG tablet Take 5 mg by mouth every morning. Patient not taking: Reported on 04/24/2021 09/20/19   [provider]  budesonide-formoterol (SYMBICORT) 160-4.5 MCG/ACT inhaler Inhale 2 puffs into the lungs 2 (two) times daily. 02/13/18   Merlyn Lot, MD   DG Tibia/Fibula Right  Result Date: 04/25/2021 CLINICAL DATA:  Altered mental status and recurrent falls. EXAM: RIGHT TIBIA AND FIBULA - 2 VIEW COMPARISON:  None. FINDINGS: An acute versus subacute, very mildly displaced fracture deformity is seen involving the shaft of the proximal right fibula. There is no evidence of dislocation. Soft tissues are unremarkable. IMPRESSION: Acute versus subacute fracture of the proximal right fibula. Electronically Signed   By: Virgina Norfolk M.D.   On: 04/25/2021 00:11   CT Angio Chest/Abd/Pel for Dissection W and/or Wo Contrast  Result Date: 04/24/2021 CLINICAL DATA:  Known worsening thoracic aortic aneurysm EXAM: CT ANGIOGRAPHY CHEST, ABDOMEN AND PELVIS TECHNIQUE: Non-contrast CT of the chest was initially obtained. Multidetector CT imaging through the chest, abdomen and pelvis was performed using the standard protocol during bolus administration of intravenous contrast. Multiplanar reconstructed images and MIPs were obtained and reviewed to evaluate the vascular anatomy. CONTRAST:  45mL OMNIPAQUE IOHEXOL 350 MG/ML SOLN COMPARISON:  Similar study from 5 hours previous. FINDINGS: CTA CHEST  FINDINGS Cardiovascular: No significant cardiac enlargement is noted. Pacing device is again noted and stable. Pulmonary artery as visualized is within normal limits. The ascending thoracic aorta again appears within normal limits. Some turbulence in the descending thoracic aorta shows poor admixing of contrast and blood. No new dissection flap is seen. 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 There has however been increase in the degree of enhancement in the saccular aneurysm consistent with some worsening mural hematoma. This area now measures 5.9 x 3.6 cm (previously measuring 5.3 x 3.2 cm) the more distal descending thoracic aorta shows some admixture abnormalities although no true dissection flap is seen. Mediastinum/Nodes: Thoracic inlet is within normal limits. Esophagus is unremarkable. No sizable hilar or mediastinal adenopathy is noted. Lungs/Pleura: Emphysematous changes are seen. Stable scarring in the lung bases is noted. No new focal abnormality is identified from the earlier CT. Musculoskeletal: Old  healed rib fractures are noted on the left. Stable fractures on the right are noted involving the fifth and sixth ribs anterior laterally. T12 compression fracture is noted stable in appearance from the recent exam. Review of the MIP images confirms the above findings. CTA ABDOMEN AND PELVIS FINDINGS VASCULAR Aorta: Aortic stent graft is noted in place. Patent flow through the stent graft is identified into the external iliac artery on the left and distal common iliac artery on the right. The excluded aneurysm sac is stable in appearance when compared with the prior study. No new endoleak is identified. Celiac: Celiac axis is widely patent. SMA: SMA is stable in appearance. No evidence of thrombosis or embolism is seen. Renals: Single renal arteries are identified bilaterally. No focal stenosis is seen. IMA: Occluded  at its origin secondary to the stent graft. Reconstitution of distal branches is noted. Inflow: Iliacs are patent via a runoff from the stent graft. Stenting in the right external iliac artery is seen. Common femoral artery is within normal limits bilaterally. Right internal iliac artery is patent. The left internal iliac artery is occluded at its origin also stable from the prior study. Veins: No specific venous abnormality is noted. Review of the MIP images confirms the above findings. NON-VASCULAR Hepatobiliary: No focal liver abnormality is seen. No gallstones, gallbladder wall thickening, or biliary dilatation. Pancreas: Unremarkable. No pancreatic ductal dilatation or surrounding inflammatory changes. Spleen: Normal in size without focal abnormality. Adrenals/Urinary Tract: Adrenal glands are within normal limits. Kidneys are within normal limits with the exception of a large left renal cyst stable from the prior exam. Scattered vascular calcifications are seen. No definitive renal stones are seen. Ureters appear within normal limits. The bladder is partially distended with contrast from prior CT. Stomach/Bowel: No obstructive or inflammatory changes of the bowel are seen. Lymphatic: No sizable lymphadenopathy is noted. Reproductive: Prostate is unremarkable. Other: No abdominal wall hernia or abnormality. No abdominopelvic ascites. Musculoskeletal: No acute or significant osseous findings. Review of the MIP images confirms the above findings. 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 Some slight increase in the degree of opacification of aneurysm is seen consistent with slight enlarging mural aneurysm. No new dissection is seen. Right fifth and sixth rib fractures stable from recent exam. T12 compression deformity also stable from the recent exam. CTA of the abdomen and pelvis: Known aortic stent graft which is  widely patent. No occlusion is noted. No new vascular abnormality to correspond with the patient's given clinical history is seen. No new visceral abnormality is identified to correspond with the increase in abdominal pain. Electronically Signed   By: Inez Catalina M.D.   On: 04/24/2021 23:00   CT Angio Chest/Abd/Pel for Dissection W and/or Wo Contrast  Result Date: 04/24/2021 CLINICAL DATA:  Falls. Chest radiograph suggests increased soft tissue in the region of the known thoracic aortic aneurysm. EXAM: CT ANGIOGRAPHY CHEST, ABDOMEN AND PELVIS TECHNIQUE: Non-contrast CT of the chest was initially obtained. Multidetector CT imaging through the chest, abdomen and pelvis was performed using the standard protocol during bolus administration of intravenous contrast. Multiplanar reconstructed images and MIPs were obtained and reviewed to evaluate the vascular anatomy. CONTRAST:  24mL OMNIPAQUE IOHEXOL 350 MG/ML SOLN COMPARISON:  Current chest radiograph. Chest, abdomen and pelvis CTA, 11/07/2020. FINDINGS: CTA CHEST FINDINGS Cardiovascular: As suggested on the current chest radiograph, there has been a change in the appearance of the aneurysm of  the aortic arch. At the level of the focal bulge of the aortic arch, just distal to the left subclavian artery origin, there is now increased soft tissue attenuation that is contiguous with the wall, average Hounsfield units 22. Also, there is contrast that extends into this soft tissue at the level of the focal aortic bulge, consistent with arterial blood dissecting into the mural thrombus/mural hematoma. This soft tissue measures 1.8 cm in thickness along the lateral wall the distal arch, measuring only a few mm on the prior CT, and measures approximally 5.4 x 3.3 cm inferior to the focal bulge, where it measured approximally 3.6 x 2.7 cm previously. This is suspected to be mural hemorrhage has increased from prior exam. The ascending thoracic aorta measures 3.2 cm in  diameter. At the level of the aneurysm of the distal arch the lumen now measures 3.8 cm, 3.6 cm previously. At the level of the inferior focal bulge of the distal arch, on the sagittal reconstructed images, the lumen measures 4 cm, previously 3.9 cm. Overall thoracic aorta at the level of the saccular aneurysm, including the wall and hematoma now measures 5.5 cm on the sagittal reconstructed image. The descending thoracic aorta, from wall to wall, measures 4.4 cm, above the level of the hiatus, without significant change. No aortic dissection. Heart is normal in size and configuration.  No pericardial effusion. Mediastinum/Nodes: No neck base, mediastinal or hilar masses or enlarged lymph nodes. Trachea and esophagus are unremarkable. Lungs/Pleura: Mild linear atelectasis and/or scarring at the lung bases. Centrilobular emphysema. No evidence of pneumonia or pulmonary edema. No pleural effusion or pneumothorax. No significant change. Musculoskeletal: Mild compression fracture of T12, new since the prior CT. There is slight posterior contour bulging of the vertebra, but no retropulsed fragment or significant encroachment upon the spinal. There are recent fractures of the lateral right fifth and sixth ribs. There may be additional fractures, with this assessment limited by motion. No other fractures.  No bone lesions. Review of the MIP images confirms the above findings. CTA ABDOMEN AND PELVIS FINDINGS VASCULAR Aorta: Previous exclusion of an abdominal aortic aneurysm with a stent graft, unchanged compared to the prior CT native aneurysm measuring 4.1 cm anterior-posterior by 5 cm transversely. Aortic lumen remains widely patent. Celiac: Patent without evidence of aneurysm, dissection, vasculitis or significant stenosis. SMA: Patent without evidence of aneurysm, dissection, vasculitis or significant stenosis. Renals: Images are degraded by patient motion. No convincing significant renal artery stenosis or change from  the prior CTA. IMA: Excluded at its origin with collateral flow again noted, unchanged from the prior CTA. Inflow: Iliac artery stent grafts are widely patent. Veins: No obvious venous abnormality within the limitations of this arterial phase study. Review of the MIP images confirms the above findings. NON-VASCULAR Hepatobiliary: No focal liver abnormality is seen. No gallstones, gallbladder wall thickening, or biliary dilatation. Pancreas: Unremarkable. No pancreatic ductal dilatation or surrounding inflammatory changes. Spleen: Normal in size without focal abnormality. Adrenals/Urinary Tract: No adrenal masses. Left renal cyst. Small nonobstructing stone in the lower pole the right kidney suggested. No hydronephrosis. Ureters not well visualized, but no evidence of ureteral dilation or stone. Bladder is unremarkable. Stomach/Bowel: Bowel assessment somewhat limited by motion. Stomach is unremarkable. Small bowel and colon are normal in caliber. No wall thickening convincing inflammation. Lymphatic: No enlarged lymph nodes. Reproductive: Unremarkable. Other: No abdominal wall hernia.  No ascites. Musculoskeletal: No fracture or bone lesion. Review of the MIP images confirms the above findings. IMPRESSION: CTA FINDINGS  1. 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 This increased soft tissue is consistent with an enlarging mural hematoma. A small amount arterial contrast extends into this presumed hematoma, just inferior to the saccular portion of the aneurysm. 2. Other findings of the thoracoabdominal aorta, including the excluded infrarenal abdominal aortic aneurysm, are without significant change from the prior CTA. NON CTA FINDINGS 1. Recent, mild compression fracture of T12, not present on the prior CTA. Patient's chest radiographic history reports multiple falls and right rib pain. T12 fracture is therefore suspected to be acute. 2.  Recent/acute fractures of the right lateral fifth and sixth ribs. There may be other rib fractures, but this assessment is limited by respiratory motion. 3. No other nonvascular acute abnormalities within the chest, abdomen or pelvis. Electronically Signed   By: Lajean Manes M.D.   On: 04/24/2021 17:54    Positive ROS: All other systems have been reviewed and were otherwise negative with the exception of those mentioned in the HPI and as above.  Physical Exam: General: Alert, no acute distress  MUSCULOSKELETAL: Right lower extremity: Patient has resolving ecchymosis and mild erythema and focal swelling over the proximal lateral right lower leg.  The leg compartments are soft and compressible.  He had no pain with active movement of the right knee or ankle.  Patient had no tenderness over the length of the fibula bone to palpation.  There is no tibial tenderness.  Patient had no palpable crepitus to palpation of the fibula.  There is no obvious deformity.  Distally he had palpable pedal pulses, intact sensation to touch and intact motor function.  Assessment: Subacute right proximal fibular fracture   Plan: Patient's x-rays appear to demonstrate some callus formation around a mildly displaced fracture of the proximal right fibular shaft.  Patient had no tenderness to palpation on exam over the fracture site today.  No surgery will be required for this fracture. Patient may begin physical therapy when medically appropriate.  He may weight-bear as tolerated on the right lower extremity.  If he has pain with weightbearing a cam walker boot could be ordered for the patient.  I explained this plan to the patient.  He understood and agreed with this plan.  I have also conveyed this plan to the patient's nurse.   Thornton Park, MD    04/26/2021 5:03 PM

## 2021-04-26 NOTE — Progress Notes (Signed)
Speech Language Pathology Treatment: Dysphagia  Patient Details Name: James Dickerson MRN: [id number] DOB: [birth year] Today's Date: 04/26/2021 Time: 0923-3007 SLP Time Calculation (min) (ACUTE ONLY): 12 min  Assessment / Plan / Recommendation Clinical Impression  Pt seen for ongoing dysphagia management. A review of pt's chart reveals cause of pt's aphonia.   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   Most recent chest CT revealed an increase in the degree of enhancement in the saccular aneurysm consistent with some worsening mural hematoma. This area now measures 5.9 x 3.6 cm (previously measuring 5.3 x 3.2 cm) the more distal descending thoracic aorta shows some admixture abnormalities although no true dissection flap is seen.  During today's treatment, skilled observation was provided of pt consuming ice chips and sips of thin liquids. Pt was free of overt s/s of aspiration when consuming ice chips, however when consuming sips of thin liquids, he had several delayed throat clears. Given the above information, recommend an instrumental swallow study to assess for glottal suffiency to prevent aspiration pneumonia.      HPI HPI: Per admitting H&P'James Dickerson is a 80 y.o. African-American male with medical history significant for COPD, depression, GERD, dyslipidemia and hypertension, who presented to the ER with acute onset of altered mental status with recent recurrent falls.  He had a subsequent contusion to his right cheek.  He was complaining in triage from chest pain as well as hip pain.  The patient is a poor historian due to his altered mental status"      SLP Plan  MBS      Recommendations for follow up therapy are one component of a  multi-disciplinary discharge planning process, led by the attending physician.  Recommendations may be updated based on patient status, additional functional criteria and insurance authorization.    Recommendations  Diet recommendations: Dysphagia 1 (puree);Nectar-thick liquid Liquids provided via: Cup Medication Administration: Crushed with puree Supervision: Staff to assist with self feeding;Full supervision/cueing for compensatory strategies Compensations: Small sips/bites Postural Changes and/or Swallow Maneuvers: Seated upright 90 degrees                Oral Care Recommendations: Oral care before and after PO Follow Up Recommendations: Skilled nursing-short term rehab (<3 hours/day) Assistance recommended at discharge: Frequent or constant Supervision/Assistance SLP Visit Diagnosis: Dysphagia, oropharyngeal phase (R13.12) Plan: MBS       GO                James Dickerson  04/26/2021, 2:40 PM

## 2021-04-26 NOTE — Plan of Care (Signed)

## 2021-04-27 MED ORDER — CEPHALEXIN 500 MG PO CAPS
500.0000 mg | ORAL_CAPSULE | Freq: Three times a day (TID) | ORAL | Status: AC
  Administered 2021-04-27 – 2021-04-28 (×4): 500 mg via ORAL
  Filled 2021-04-27 (×5): qty 1

## 2021-04-27 MED ORDER — AMLODIPINE BESYLATE 10 MG PO TABS
10.0000 mg | ORAL_TABLET | Freq: Every day | ORAL | Status: DC
  Administered 2021-04-27 – 2021-05-01 (×5): 10 mg via ORAL
  Filled 2021-04-27 (×5): qty 1

## 2021-04-27 NOTE — Progress Notes (Signed)
PT Cancellation Note  Patient Details Name: James Dickerson MRN: [id number] DOB: 12-03-1940   Cancelled Treatment:    Reason Eval/Treat Not Completed: Patient not medically ready PT orders received, chart reviewed. Pt still has fluctuating BP with systolic >035 bpm at times. Spoke with MD who recommends to hold PT at this time. Due to therapy protocol will d/c PT orders due to 3 failed attempts. Please re-consult once pt is more medically stable & appropriate to participate in PT. Thank you.  [name], PT, DPT 04/27/21, 8:15 AM    [name] 04/27/2021, 8:14 AM

## 2021-04-27 NOTE — Progress Notes (Signed)
PROGRESS NOTE    James Dickerson  LGX:[id number] DOB: 1941-02-29 DOA: 04/24/2021 PCP: Pcp, No   Brief Narrative: Taken from H&P. James Dickerson is a 80 y.o. African-American male with medical history significant for COPD, depression, GERD, dyslipidemia and hypertension, who presented to the ER with acute onset of altered mental status with recent recurrent falls.  He had a subsequent contusion to his right cheek.  Patient was recently moved to a different group home on Wednesday.  On presentation to ED patient had markedly elevated blood pressure, tachycardic mom, mildly tachypneic, labs only pertinent for mild hypokalemia.  COVID-19 and influenza PCR negative.  UA concerning for UTI.  Multiple imaging for concern of falls and injuries shows multiple fractures which include right fifth and sixth rib, T12, and right proximal fibula.  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  A small amount of arterial contrast extended into the presumed hematoma, concern for leaking aneurysm.  Vascular surgery was consulted and they are recommending conservative management with blood pressure control per admitting provider note, has not seen any notes from vascular surgery yet. He was started on esmolol infusion for better control of hypertension. CT head, cervical spine and sinuses were without any acute findings.  Orthopedic was consulted this morning, notify Dr. Mack Guise about right proximal fibular fracture.  Talked with daughter today and according to her patient has very poor functional status at baseline, bed bound most of the time, not eating and drinking.  They moved him to a different group home recently.  He has 2 daughters, 1 lives in [place].  It looks like family is not much involved in his care.  Discussed his full code with her and with her  permission changed status to DNR with full scope of medical care as performing a CPR on him will be more damaging and fruitless.  Extremely malnourished elderly man, very poor functional status, aphasic, poor p.o. intake and advanced dementia at baseline, very high risk for deterioration and death.  Palliative care was also consulted.  12/3: Vascular surgery per their recommendations today, stating that patient needs to transfer to [facility] for a possible thoracic saccular aneurysm repair.  Contacted the [facility] transfer line.  They gave me the name of Dr. Mali Hughes at [facility]. Patient was more alert and able to participate with feeding and taking meds today.  He started taking some p.o. intake. 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  4:16 PM: Received a call back from [facility] cardiothoracic team stating that they reviewed the imaging and there is no acute need to perform the surgery, he needs to follow-up with Dr. Ysidro Evert as an outpatient.  Subjective: Patient was seen and examined today.  No new complaints.  Cousin and nephew at bedside.  Assessment & Plan:   Principal Problem:   Altered mental status Active Problems:   Thoracic aortic aneurysm (TAA)   Lower urinary tract infectious disease   Contusion  Altered mental status with recent recurrent falls/multiple fractures.  Resulted in multiple fractures which include right fifth and sixth rib, T12 and right proximal fibula.  Patient has an history of advanced dementia so not sure how much mental status is changed from baseline.  Very poor functional status, mostly bedbound and poor p.o. intake for a while with declining health per daughter. -We will obtain PT/OT evaluation once more stable. -Orthopedic surgery was consulted-and they are recommending conservative management. -Vascular surgery ordered  carotid vascular ultrasound.  Sepsis.  Patient met sepsis criteria with tachycardia, tachypnea and a presumed UTI.  Preliminary blood cultures negative so far.  Urine cultures pending.  Procalcitonin elevated at 0.44 and mildly elevated lactic acid at 2.5 >>2.4.  Urine culture with pansensitive E. Coli. -Ceftriaxone was switched with Keflex to complete a 5-day course.   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  Vascular surgery was consulted from ED, no formal note yet but according to admitting provider note that they advised conservative management with blood pressure control.  He was started on esmolol infusion. -Continue with esmolol infusion to keep the systolic below 327. -Continue home dose of amlodipine and Diovan.  -Patient need to transfer to [facility] for a potential saccular aortic arch aneurysm repair as it is leaking and high risk for rupture.  Called [facility] transfer line-talked with Dr. Mali Hughes team and they are recommending outpatient follow-up as there is no need for acute transfer.  History of prior CVA.  CT head negative for any acute findings.  -Continue home dose of statin -Swallow team recommended dysphagia 2 diet.  Hypokalemia.  Resolved -Monitor potassium and replete as needed.  Hypertensive urgency.  Blood pressure improving on esmolol infusion.  Still little fluctuated with that time remained above 140. -Add amlodipine to her home regimen -Continue with esmolol infusion -Continue with home meds  Severe protein caloric malnutrition.  Severe muscle wasting and poor p.o. intake. -Dietitian consult  Elevated troponin.  Mildly elevated troponin, most likely secondary to demand ischemia.  No chest pain.  Troponin with a flat curve.  Dyslipidemia. -Continue with statin  Dementia. -Continue with home dose of Aricept  History of overactive bladder. Continue home dose of Myrbetriq  BPH. -Continue home dose of Flomax  COPD without  exacerbation. -Continue as needed bronchodilators  GERD. -Continue PPI  Objective: Vitals:   04/27/21 1200 04/27/21 1300 04/27/21 1400 04/27/21 1500  BP: 139/80 121/81 120/73 115/79  Pulse: 67 81 77 77  Resp: '20 13 12 13  ' Temp:      TempSrc:      SpO2: 99% 100% 100% 100%  Weight:      Height:        Intake/Output Summary (Last 24 hours) at 04/27/2021 1506 Last data filed at 04/27/2021 1200 Gross per 24 hour  Intake 358.88 ml  Output 495 ml  Net -136.12 ml    Filed Weights   04/24/21 1338  Weight: 63.5 kg    Examination:  General.  Cachectic and malnourished elderly man, in no acute distress. Pulmonary.  Lungs clear bilaterally, normal respiratory effort. CV.  Regular rate and rhythm, no JVD, rub or murmur. Abdomen.  Soft, nontender, nondistended, BS positive. CNS.  Alert and oriented .  No focal neurologic deficit. Extremities.  No edema, no cyanosis, pulses intact and symmetrical. Psychiatry.  Judgment and insight appears normal.   DVT prophylaxis: SCDs, patient has leaking thoracic aneurysm with mural thrombus Code Status: DNR-discussed with daughter and changed the status from full code to DNR with full scope of medical care Family Communication: Discussed with cousin and a nephew at bedside Disposition Plan:  Status is: Inpatient  Remains inpatient appropriate because: Severity of illness.  Level of care: Stepdown  All the records are reviewed  and case discussed with Care Management/Social Worker. Management plans discussed with the patient, nursing and they are in agreement.  Consultants:  Vascular surgery Orthopedic surgery  Procedures:  Antimicrobials:  Keflex  Data Reviewed: I have personally reviewed following labs and imaging studies  CBC: Recent Labs  Lab 04/24/21 1341 04/25/21 0218  WBC 8.7 8.8  HGB 12.5* 12.8*  HCT 38.0* 39.2  MCV 93.6 93.8  PLT 154 145*    Basic Metabolic Panel: Recent Labs  Lab 04/24/21 1341 04/25/21 0218   NA 135 136  K 3.2* 3.6  CL 101 104  CO2 26 22  GLUCOSE 117* 113*  BUN 21 23  CREATININE 1.19 1.13  CALCIUM 9.2 9.2    GFR: Estimated Creatinine Clearance: 41.3 mL/min (by C-G formula based on SCr of 1.13 mg/dL). Liver Function Tests: No results for input(s): AST, ALT, ALKPHOS, BILITOT, PROT, ALBUMIN in the last 168 hours. No results for input(s): LIPASE, AMYLASE in the last 168 hours. No results for input(s): AMMONIA in the last 168 hours. Coagulation Profile: Recent Labs  Lab 04/25/21 0218  INR 1.4*    Cardiac Enzymes: No results for input(s): CKTOTAL, CKMB, CKMBINDEX, TROPONINI in the last 168 hours. BNP (last 3 results) No results for input(s): PROBNP in the last 8760 hours. HbA1C: No results for input(s): HGBA1C in the last 72 hours. CBG: Recent Labs  Lab 04/25/21 0040  GLUCAP 116*    Lipid Profile: No results for input(s): CHOL, HDL, LDLCALC, TRIG, CHOLHDL, LDLDIRECT in the last 72 hours. Thyroid Function Tests: No results for input(s): TSH, T4TOTAL, FREET4, T3FREE, THYROIDAB in the last 72 hours. Anemia Panel: No results for input(s): VITAMINB12, FOLATE, FERRITIN, TIBC, IRON, RETICCTPCT in the last 72 hours. Sepsis Labs: Recent Labs  Lab 04/24/21 2337 04/25/21 0218 04/25/21 0913  PROCALCITON  --  0.44  --   LATICACIDVEN 2.9* 2.5* 2.4*     Recent Results (from the past 240 hour(s))  Urine Culture     Status: Abnormal   Collection Time: 04/24/21  7:16 PM   Specimen: Urine, Clean Catch  Result Value Ref Range Status   Specimen Description   Final    URINE, CLEAN CATCH Performed at [facility], [address], [place], [place] [address]    Special Requests   Final    NONE Performed at [facility], [address] [place], [place] [address]    Culture >=100,000 COLONIES/mL ESCHERICHIA COLI (A)  Final   Report Status 04/26/2021 FINAL  Final   Organism ID, Bacteria ESCHERICHIA COLI (A)  Final      Susceptibility    Escherichia coli - MIC*    AMPICILLIN 8 SENSITIVE Sensitive     CEFAZOLIN <=4 SENSITIVE Sensitive     CEFEPIME <=0.12 SENSITIVE Sensitive     CEFTRIAXONE <=0.25 SENSITIVE Sensitive     CIPROFLOXACIN <=0.25 SENSITIVE Sensitive     GENTAMICIN <=1 SENSITIVE Sensitive     IMIPENEM <=0.25 SENSITIVE Sensitive     NITROFURANTOIN <=16 SENSITIVE Sensitive     TRIMETH/SULFA <=20 SENSITIVE Sensitive     AMPICILLIN/SULBACTAM 4 SENSITIVE Sensitive     PIP/TAZO <=4 SENSITIVE Sensitive     * >=100,000 COLONIES/mL ESCHERICHIA COLI  Resp Panel by RT-PCR (Flu A&B, Covid) Nasopharyngeal Swab     Status: None   Collection Time: 04/24/21  9:12 PM   Specimen: Nasopharyngeal Swab; Nasopharyngeal(NP) swabs in vial transport medium  Result Value Ref Range Status   SARS Coronavirus 2 by RT PCR NEGATIVE NEGATIVE  Final    Comment: (NOTE) SARS-CoV-2 target nucleic acids are NOT DETECTED.  The SARS-CoV-2 RNA is generally detectable in upper respiratory specimens during the acute phase of infection. The lowest concentration of SARS-CoV-2 viral copies this assay can detect is 138 copies/mL. A negative result does not preclude SARS-Cov-2 infection and should not be used as the sole basis for treatment or other patient management decisions. A negative result may occur with  improper specimen collection/handling, submission of specimen other than nasopharyngeal swab, presence of viral mutation(s) within the areas targeted by this assay, and inadequate number of viral copies(<138 copies/mL). A negative result must be combined with clinical observations, patient history, and epidemiological information. The expected result is Negative.  Fact Sheet for Patients:  EntrepreneurPulse.com.au  Fact Sheet for Healthcare Providers:  IncredibleEmployment.be  This test is no t yet approved or cleared by the [place] FDA and  has been authorized for detection and/or diagnosis of  SARS-CoV-2 by FDA under an Emergency Use Authorization (EUA). This EUA will remain  in effect (meaning this test can be used) for the duration of the COVID-19 declaration under Section 564(b)(1) of the Act, 21 U.S.C.section 360bbb-3(b)(1), unless the authorization is terminated  or revoked sooner.       Influenza A by PCR NEGATIVE NEGATIVE Final   Influenza B by PCR NEGATIVE NEGATIVE Final    Comment: (NOTE) The Xpert Xpress SARS-CoV-2/FLU/RSV plus assay is intended as an aid in the diagnosis of influenza from Nasopharyngeal swab specimens and should not be used as a sole basis for treatment. Nasal washings and aspirates are unacceptable for Xpert Xpress SARS-CoV-2/FLU/RSV testing.  Fact Sheet for Patients: EntrepreneurPulse.com.au  Fact Sheet for Healthcare Providers: IncredibleEmployment.be  This test is not yet approved or cleared by the [place] FDA and has been authorized for detection and/or diagnosis of SARS-CoV-2 by FDA under an Emergency Use Authorization (EUA). This EUA will remain in effect (meaning this test can be used) for the duration of the COVID-19 declaration under Section 564(b)(1) of the Act, 21 U.S.C. section 360bbb-3(b)(1), unless the authorization is terminated or revoked.  Performed at [facility], [address] [place], [place] [address]   CULTURE, BLOOD (ROUTINE X 2) w Reflex to ID Panel     Status: None (Preliminary result)   Collection Time: 04/24/21 11:37 PM   Specimen: BLOOD  Result Value Ref Range Status   Specimen Description BLOOD LEFT FOREARM  Final   Special Requests   Final    BOTTLES DRAWN AEROBIC AND ANAEROBIC Blood Culture results may not be optimal due to an inadequate volume of blood received in culture bottles   Culture   Final    NO GROWTH 3 DAYS Performed at [facility], [address] [place], [place] [address]    Report Status PENDING  Incomplete  CULTURE,  BLOOD (ROUTINE X 2) w Reflex to ID Panel     Status: None (Preliminary result)   Collection Time: 04/24/21 11:37 PM   Specimen: BLOOD  Result Value Ref Range Status   Specimen Description BLOOD RIGHT ASSIST CONTROL  Final   Special Requests   Final    BOTTLES DRAWN AEROBIC AND ANAEROBIC Blood Culture adequate volume   Culture   Final    NO GROWTH 3 DAYS Performed at [facility] [address] [place], [place] [address]    Report Status PENDING  Incomplete  MRSA Next Gen by PCR, Nasal     Status: None   Collection  Time: 04/25/21 12:50 AM   Specimen: Nasal Mucosa; Nasal Swab  Result Value Ref Range Status   MRSA by PCR Next Gen NOT DETECTED NOT DETECTED Final    Comment: (NOTE) The GeneXpert MRSA Assay (FDA approved for NASAL specimens only), is one component of a comprehensive MRSA colonization surveillance program. It is not intended to diagnose MRSA infection nor to guide or monitor treatment for MRSA infections. Test performance is not FDA approved in patients less than 28 years old. Performed at [facility], [address], [place], [place] [address]       Radiology Studies: No results found.  Scheduled Meds:  amLODipine  10 mg Oral Daily   aspirin EC  81 mg Oral Q0600   atorvastatin  40 mg Oral QHS   cephALEXin  500 mg Oral Q8H   Chlorhexidine Gluconate Cloth  6 each Topical Daily   cholecalciferol  2,000 Units Oral Daily   donepezil  10 mg Oral Daily   feeding supplement (NEPRO CARB STEADY)  237 mL Oral TID BM   irbesartan  150 mg Oral Daily   megestrol  40 mg Oral Daily   mirabegron ER  25 mg Oral Daily   multivitamin with minerals  1 tablet Oral Daily   pantoprazole  40 mg Oral BID   tamsulosin  0.4 mg Oral Daily   Continuous Infusions:  esmolol 25 mcg/kg/min (04/27/21 1423)     LOS: 3 days   Time spent: 45 minutes. More than 50% of the time was spent in counseling/coordination of care with consultants and family.  Lorella Nimrod,  MD [facility]  If 7PM-7AM, please contact night-coverage [email or web address]  04/27/2021, 3:06 PM   This record has been created using Systems analyst. Errors have been sought and corrected,but may not always be located. Such creation errors do not reflect on the standard of care.

## 2021-04-27 NOTE — Progress Notes (Signed)
IV site infiltrated on right arm. Cannula removed and inserted gauge 22 on left arm. Affected arm elevated.

## 2021-04-28 ENCOUNTER — Inpatient Hospital Stay: Payer: Medicare Other

## 2021-04-28 DIAGNOSIS — F03B0 Unspecified dementia, moderate, without behavioral disturbance, psychotic disturbance, mood disturbance, and anxiety: Secondary | ICD-10-CM

## 2021-04-28 DIAGNOSIS — I7122 Aneurysm of the aortic arch, without rupture: Secondary | ICD-10-CM

## 2021-04-28 DIAGNOSIS — Z66 Do not resuscitate: Secondary | ICD-10-CM

## 2021-04-28 DIAGNOSIS — Z515 Encounter for palliative care: Secondary | ICD-10-CM

## 2021-04-28 MED ORDER — FOOD THICKENER (SIMPLYTHICK)
1.0000 | ORAL | Status: DC | PRN
  Filled 2021-04-28: qty 1

## 2021-04-28 MED ORDER — PANTOPRAZOLE 2 MG/ML SUSPENSION
40.0000 mg | Freq: Two times a day (BID) | ORAL | Status: DC
Start: 2021-04-28 — End: 2021-04-28

## 2021-04-28 MED ORDER — PANTOPRAZOLE 2 MG/ML SUSPENSION
40.0000 mg | Freq: Two times a day (BID) | ORAL | Status: DC
  Administered 2021-04-28 – 2021-05-01 (×5): 40 mg via ORAL
  Filled 2021-04-28 (×8): qty 20

## 2021-04-28 NOTE — Consult Note (Signed)
Consultation Note Date: 04/28/2021   Patient Name: James Dickerson  DOB: 05-11-1941  MRN: [id number]  Age / Sex: 80 y.o., male  PCP: Pcp, No Referring Physician: Lorella Nimrod, MD  Reason for Consultation: Establishing goals of care  HPI/Patient Profile: 80 y.o. male  with past medical history of COPD, depression, GERD, dyslipidemia, HTN, BPH, overactive bladder, prior CVA, and dementia admitted on 04/24/2021 with altered mental status and recurrent falls.  During admission imaging reflects multiple fractures (right fifth and sixth ribs, T12, and right proximal fibula).  CT of chest and abdomen revealed enlarging mural hematoma and concern for leaking aneurysm.  Palliative medicine team was consulted to discuss goals of care.   Clinical Assessment and Goals of Care: I have reviewed medical records including EPIC notes, labs and imaging, assessed the patient and then met with patient at bedside to discuss diagnosis prognosis, GOC, EOL wishes, disposition and options.  I introduced Palliative Medicine as specialized medical care for people living with serious illness. It focuses on providing relief from the symptoms and stress of a serious illness. The goal is to improve quality of life for both the patient and the family.  Patient seemed dazed and was not able to appropriately respond to questions.  For example, I asked if that was a good time to talk and he said yes.  However he was grunting and nursing notified me that he was sitting on a bedpan.  Given patient is not a valid historian I spoke with his daughter over the telephone.  We discussed a brief life review of the patient.  She reports that the patient has 3 daughters, Dewaine Oats, Doroteo Bradford (aka Sissy), and Shirlean Mylar.  Dewaine Oats lives in [place] and shares that the patient's daughter Doroteo Bradford would be more helpful and providing a review of the patient's past life.  Dewaine Oats said  she would message her sister Doroteo Bradford since she is currently at work and unable to speak over the phone.    As per chart review, Doroteo Bradford shares with other providers that the patient had a poor functional status, was mostly bedbound, and had poor p.o. intake prior to admission.  We discussed patient's current illness and what it means in the larger context of patient's on-going co-morbidities.  Natural disease trajectory and expectations at EOL were discussed.  Education provided that dementia is a chronic and progressive disease.  I attempted to elicit values and goals of care important to the patient.  Dewaine Oats defers all ACP decisions to sister Doroteo Bradford who lives locally.  Yvette asked about the patient's swallow study.  SLP notes and recommendations shared with [facility].  Need for rapid response shared and discussed.  Therapeutic silence and active listening provided for patient's daughter to share her thoughts and emotions.  She asked when the patient would be sent back to his facility.  I said it had yet to be determined when the patient will be discharged since he is not medically stable at the moment.  Education offered regarding concept specific to human mortality and  the limitations of medical interventions to prolong life when the body begins to fail to thrive. Family is facing treatment option decisions, advanced directive, and anticipatory care needs.    Discussed with patient/family the importance of continued conversation with family and the medical providers regarding overall plan of care and treatment options, ensuring decisions are within the context of the patient's values and GOCs.    Questions and concerns were addressed. The family was encouraged to call with questions or concerns.  I will attempt to speak with patient's daughter Doroteo Bradford tomorrow.  Palliative medicine team will continue to monitor the patient throughout his hospitalization.  Primary Decision Maker NEXT OF KIN  Code  Status/Advance Care Planning: DNR  Prognosis:   Unable to determine  Discharge Planning: To Be Determined  Primary Diagnoses: Present on Admission:  Altered mental status   Physical Exam Constitutional:      General: He is not in acute distress.    Appearance: He is not ill-appearing or toxic-appearing.     Comments: frail  HENT:     Head: Normocephalic and atraumatic.     Mouth/Throat:     Mouth: Mucous membranes are moist.  Cardiovascular:     Rate and Rhythm: Normal rate.     Pulses: Normal pulses.  Pulmonary:     Effort: Pulmonary effort is normal.  Abdominal:     Palpations: Abdomen is soft.  Musculoskeletal:     Cervical back: Normal range of motion.     Comments: Generalized weakness  Neurological:     Mental Status: He is alert. Mental status is at baseline.  Psychiatric:        Mood and Affect: Mood normal.    Vital Signs: BP 120/67 (BP Location: Left Arm)   Pulse 96   Temp 98.2 F (36.8 C) (Oral)   Resp 18   Ht '5\' 3"'  (1.6 m)   Wt 63.5 kg   SpO2 100%   BMI 24.80 kg/m  Pain Scale: 0-10   Pain Score: 0-No pain SpO2: SpO2: 100 % O2 Device:SpO2: 100 % O2 Flow Rate: .   Palliative Assessment/Data: 30%     I discussed this patient's plan of care with patient's daughter Dewaine Oats.  Thank you for this consult. Palliative medicine will continue to follow and assist holistically.   Time Total: 70 minutes Greater than 50%  of this time was spent counseling and coordinating care related to the above assessment and plan.  Signed by: Jordan Hawks, DNP, FNP-BC Palliative Medicine    Please contact Palliative Medicine Team phone at [phone] for questions and concerns.  For individual provider: See Shea Evans

## 2021-04-28 NOTE — Progress Notes (Signed)
Modified Barium Swallow Progress Note  Patient Details  Name: James Dickerson MRN: [id number] Date of Birth: 29-Jun-1940  Today's Date: 04/28/2021  Modified Barium Swallow completed.  Full report located under Chart Review in the Imaging Section.  Brief recommendations include the following:  Clinical Impression  Pt demonstrates moderate oral phase dysphagia. Pt consumed 2 trials of nectar thick liquids via spoon, 1 trial of nectar thick liquid via cup and 1 trial of thin liquids via spoon. His oral phase is c/b weak lingual manipulation of boluses resulting in decreased posterior propulsion of each bolus, premature spillage and piecemeal swallowing. Pt's pharyngeal phase is c/b swallow initiation at the level of the vallecula with no evidence of penetration or aspiration. After consuming the above 4 trials, pt became SOB, RR up to 34, air hungry, unable to control breathing despite heavy cues for pursed lip breaths, O2 dropping into the 70's with difficulty recovering. Rapid response arrived and placed pt on supplemental O2 with sats recovering. Recommend further trials of thin liquids at bedside under close monitoring of ICU.   Swallow Evaluation Recommendations       SLP Diet Recommendations: Dysphagia 1 (Puree) solids;Nectar thick liquid   Liquid Administration via: Cup   Medication Administration: Crushed with puree   Supervision: Staff to assist with self feeding   Compensations: Minimize environmental distractions;Slow rate;Small sips/bites   Postural Changes: Seated upright at 90 degrees   Oral Care Recommendations: Oral care BID   Other Recommendations: Order thickener from pharmacy;Prohibited food (jello, ice cream, thin soups);Remove water pitcher   Kasyn Stouffer B. Rutherford Nail M.S., CCC-SLP, [place] [facility] Services Office [phone]  Gavyn Ybarra 04/28/2021,9:43 AM

## 2021-04-28 NOTE — Progress Notes (Signed)
PROGRESS NOTE    James Dickerson  CXK:[id number] DOB: [birth year] DOA: 04/24/2021 PCP: Pcp, No   Brief Narrative: Taken from H&P. James Dickerson is a 80 y.o. African-American male with medical history significant for COPD, depression, GERD, dyslipidemia and hypertension, who presented to the ER with acute onset of altered mental status with recent recurrent falls.  He had a subsequent contusion to his right cheek.  Patient was recently moved to a different group home on Wednesday.  On presentation to ED patient had markedly elevated blood pressure, tachycardic mom, mildly tachypneic, labs only pertinent for mild hypokalemia.  COVID-19 and influenza PCR negative.  UA concerning for UTI.  Multiple imaging for concern of falls and injuries shows multiple fractures which include right fifth and sixth rib, T12, and right proximal fibula.  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  A small amount of arterial contrast extended into the presumed hematoma, concern for leaking aneurysm.  Vascular surgery was consulted and they are recommending conservative management with blood pressure control per admitting provider note, has not seen any notes from vascular surgery yet. He was started on esmolol infusion for better control of hypertension. CT head, cervical spine and sinuses were without any acute findings.  Orthopedic was consulted this morning, notify Dr. Mack Guise about right proximal fibular fracture.  Talked with daughter today and according to her patient has very poor functional status at baseline, bed bound most of the time, not eating and drinking.  They moved him to a different group home recently.  He has 2 daughters, 1 lives in [place].  It looks like family is not much involved in his care.  Discussed his full code with her and with her  permission changed status to DNR with full scope of medical care as performing a CPR on him will be more damaging and fruitless.  Extremely malnourished elderly man, very poor functional status, aphasic, poor p.o. intake and advanced dementia at baseline, very high risk for deterioration and death.  Palliative care was also consulted.  12/3: Vascular surgery per their recommendations today, stating that patient needs to transfer to [facility] for a possible thoracic saccular aneurysm repair.  Contacted the [facility] transfer line.  They gave me the name of Dr. Mali Hughes at [facility]. Patient was more alert and able to participate with feeding and taking meds today.  He started taking some p.o. intake. 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  4:16 PM: Received a call back from [facility] cardiothoracic team stating that they reviewed the imaging and there is no acute need to perform the surgery, he needs to follow-up with Dr. Ysidro Evert as an outpatient.  12/5: Patient underwent barium swallow studies today, no obvious aspiration noted but he did desaturated during the procedure requiring rapid response, saturation quickly improved with supplemental oxygen and he was subsequently weaned off to room air.  Subjective: Patient was resting comfortably when seen today.  Denies any complaints.  When I ask that what happened during the procedure, he said I do not know.  Denies any shortness of breath or chest pain.  Assessment & Plan:   Principal Problem:   Altered mental status Active Problems:   Thoracic aortic  aneurysm (TAA)   Lower urinary tract infectious disease   Contusion  Altered mental status with recent recurrent falls/multiple fractures.  Resolved.  Resulted in multiple fractures which include right fifth and sixth rib, T12 and right proximal fibula.  Patient  has an history of advanced dementia so not sure how much mental status is changed from baseline.  Very poor functional status, mostly bedbound and poor p.o. intake for a while with declining health per daughter. -We will obtain PT/OT evaluation, patient is currently medically stable to participate. -Orthopedic surgery was consulted-and they are recommending conservative management. -Vascular surgery ordered carotid vascular ultrasound.  Sepsis.  Patient met sepsis criteria with tachycardia, tachypnea and a presumed UTI.  Preliminary blood cultures negative so far.  Urine cultures pending.  Procalcitonin elevated at 0.44 and mildly elevated lactic acid at 2.5 >>2.4.  Urine culture with pansensitive E. Coli. -Ceftriaxone was switched with Keflex to complete a 5-day course.   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  Vascular surgery was consulted from ED, no formal note yet but according to admitting provider note that they advised conservative management with blood pressure control.  He was started on esmolol infusion. -Continue with esmolol infusion to keep the systolic below 572. -Continue home dose of amlodipine and Diovan.  -Patient need to transfer to [facility] for a potential saccular aortic arch aneurysm repair as it is leaking and high risk for rupture.  Called [facility] transfer line-talked with Dr. Mali Hughes team and they are recommending outpatient follow-up as there is no need for acute transfer. -Currently medically stable to participate with PT for disposition. -Very poor family support.  History of prior CVA.  CT head negative for any acute findings.  -Continue home dose of statin -Swallow team recommended dysphagia 2 diet.  Hypokalemia.  Resolved -Monitor potassium and replete as needed.  Hypertensive urgency.  Blood pressure currently within goal and esmolol infusion is being tapered down after adding amlodipine yesterday.   -Continue amlodipine -Continue with  esmolol infusion-try to wean completely -Continue with home meds  Severe protein caloric malnutrition.  Severe muscle wasting and poor p.o. intake. -Dietitian consult  Elevated troponin.  Mildly elevated troponin, most likely secondary to demand ischemia.  No chest pain.  Troponin with a flat curve.  Dyslipidemia. -Continue with statin  Dementia. -Continue with home dose of Aricept  History of overactive bladder. Continue home dose of Myrbetriq  BPH. -Continue home dose of Flomax  COPD without exacerbation. -Continue as needed bronchodilators  GERD. -Continue PPI  Objective: Vitals:   04/28/21 0900 04/28/21 1000 04/28/21 1100 04/28/21 1200  BP:   112/71 120/67  Pulse: 100 (!) 121 (!) 114 96  Resp: _0 Temp:    98.2 F (36.8 C)  TempSrc:    Oral  SpO2: 100% 100% 100% 100%  Weight:      Height:        Intake/Output Summary (Last 24 hours) at 04/28/2021 1506 Last data filed at 04/28/2021 1500 Gross per 24 hour  Intake 693.42 ml  Output 900 ml  Net -206.58 ml    Filed Weights   04/24/21 1338  Weight: 63.5 kg    Examination:  General.  Severely malnourished gentleman, in no acute distress. Pulmonary.  Lungs clear bilaterally, normal respiratory effort. CV.  Regular rate and rhythm, no JVD, rub or murmur. Abdomen.  Soft, nontender, nondistended, BS positive. CNS.  Alert and oriented .  No focal neurologic deficit. Extremities.  No edema,  no cyanosis, pulses intact and symmetrical. Psychiatry.  Judgment and insight appears normal.   DVT prophylaxis: SCDs, patient has leaking thoracic aneurysm with mural thrombus Code Status: DNR-discussed with daughter and changed the status from full code to DNR with full scope of medical care Family Communication:  Disposition Plan:  Status is: Inpatient  Remains inpatient appropriate because: Severity of illness.  Level of care: Progressive  All the records are reviewed and case discussed with Care  Management/Social Worker. Management plans discussed with the patient, nursing and they are in agreement.  Consultants:  Vascular surgery Orthopedic surgery  Procedures:  Antimicrobials:  Keflex  Data Reviewed: I have personally reviewed following labs and imaging studies  CBC: Recent Labs  Lab 04/24/21 1341 04/25/21 0218  WBC 8.7 8.8  HGB 12.5* 12.8*  HCT 38.0* 39.2  MCV 93.6 93.8  PLT 154 145*    Basic Metabolic Panel: Recent Labs  Lab 04/24/21 1341 04/25/21 0218  NA 135 136  K 3.2* 3.6  CL 101 104  CO2 26 22  GLUCOSE 117* 113*  BUN 21 23  CREATININE 1.19 1.13  CALCIUM 9.2 9.2    GFR: Estimated Creatinine Clearance: 41.3 mL/min (by C-G formula based on SCr of 1.13 mg/dL). Liver Function Tests: No results for input(s): AST, ALT, ALKPHOS, BILITOT, PROT, ALBUMIN in the last 168 hours. No results for input(s): LIPASE, AMYLASE in the last 168 hours. No results for input(s): AMMONIA in the last 168 hours. Coagulation Profile: Recent Labs  Lab 04/25/21 0218  INR 1.4*    Cardiac Enzymes: No results for input(s): CKTOTAL, CKMB, CKMBINDEX, TROPONINI in the last 168 hours. BNP (last 3 results) No results for input(s): PROBNP in the last 8760 hours. HbA1C: No results for input(s): HGBA1C in the last 72 hours. CBG: Recent Labs  Lab 04/25/21 0040  GLUCAP 116*    Lipid Profile: No results for input(s): CHOL, HDL, LDLCALC, TRIG, CHOLHDL, LDLDIRECT in the last 72 hours. Thyroid Function Tests: No results for input(s): TSH, T4TOTAL, FREET4, T3FREE, THYROIDAB in the last 72 hours. Anemia Panel: No results for input(s): VITAMINB12, FOLATE, FERRITIN, TIBC, IRON, RETICCTPCT in the last 72 hours. Sepsis Labs: Recent Labs  Lab 04/24/21 2337 04/25/21 0218 04/25/21 0913  PROCALCITON  --  0.44  --   LATICACIDVEN 2.9* 2.5* 2.4*     Recent Results (from the past 240 hour(s))  Urine Culture     Status: Abnormal   Collection Time: 04/24/21  7:16 PM   Specimen:  Urine, Clean Catch  Result Value Ref Range Status   Specimen Description   Final    URINE, CLEAN CATCH Performed at [facility], [address], [place], [place] [address]    Special Requests   Final    NONE Performed at [facility], [address] [place], [place] [address]    Culture >=100,000 COLONIES/mL ESCHERICHIA COLI (A)  Final   Report Status 04/26/2021 FINAL  Final   Organism ID, Bacteria ESCHERICHIA COLI (A)  Final      Susceptibility   Escherichia coli - MIC*    AMPICILLIN 8 SENSITIVE Sensitive     CEFAZOLIN <=4 SENSITIVE Sensitive     CEFEPIME <=0.12 SENSITIVE Sensitive     CEFTRIAXONE <=0.25 SENSITIVE Sensitive     CIPROFLOXACIN <=0.25 SENSITIVE Sensitive     GENTAMICIN <=1 SENSITIVE Sensitive     IMIPENEM <=0.25 SENSITIVE Sensitive     NITROFURANTOIN <=16 SENSITIVE Sensitive     TRIMETH/SULFA <=20 SENSITIVE Sensitive  AMPICILLIN/SULBACTAM 4 SENSITIVE Sensitive     PIP/TAZO <=4 SENSITIVE Sensitive     * >=100,000 COLONIES/mL ESCHERICHIA COLI  Resp Panel by RT-PCR (Flu A&B, Covid) Nasopharyngeal Swab     Status: None   Collection Time: 04/24/21  9:12 PM   Specimen: Nasopharyngeal Swab; Nasopharyngeal(NP) swabs in vial transport medium  Result Value Ref Range Status   SARS Coronavirus 2 by RT PCR NEGATIVE NEGATIVE Final    Comment: (NOTE) SARS-CoV-2 target nucleic acids are NOT DETECTED.  The SARS-CoV-2 RNA is generally detectable in upper respiratory specimens during the acute phase of infection. The lowest concentration of SARS-CoV-2 viral copies this assay can detect is 138 copies/mL. A negative result does not preclude SARS-Cov-2 infection and should not be used as the sole basis for treatment or other patient management decisions. A negative result may occur with  improper specimen collection/handling, submission of specimen other than nasopharyngeal swab, presence of viral mutation(s) within the areas targeted by this assay, and  inadequate number of viral copies(<138 copies/mL). A negative result must be combined with clinical observations, patient history, and epidemiological information. The expected result is Negative.  Fact Sheet for Patients:  EntrepreneurPulse.com.au  Fact Sheet for Healthcare Providers:  IncredibleEmployment.be  This test is no t yet approved or cleared by the [place] FDA and  has been authorized for detection and/or diagnosis of SARS-CoV-2 by FDA under an Emergency Use Authorization (EUA). This EUA will remain  in effect (meaning this test can be used) for the duration of the COVID-19 declaration under Section 564(b)(1) of the Act, 21 U.S.C.section 360bbb-3(b)(1), unless the authorization is terminated  or revoked sooner.       Influenza A by PCR NEGATIVE NEGATIVE Final   Influenza B by PCR NEGATIVE NEGATIVE Final    Comment: (NOTE) The Xpert Xpress SARS-CoV-2/FLU/RSV plus assay is intended as an aid in the diagnosis of influenza from Nasopharyngeal swab specimens and should not be used as a sole basis for treatment. Nasal washings and aspirates are unacceptable for Xpert Xpress SARS-CoV-2/FLU/RSV testing.  Fact Sheet for Patients: EntrepreneurPulse.com.au  Fact Sheet for Healthcare Providers: IncredibleEmployment.be  This test is not yet approved or cleared by the [place] FDA and has been authorized for detection and/or diagnosis of SARS-CoV-2 by FDA under an Emergency Use Authorization (EUA). This EUA will remain in effect (meaning this test can be used) for the duration of the COVID-19 declaration under Section 564(b)(1) of the Act, 21 U.S.C. section 360bbb-3(b)(1), unless the authorization is terminated or revoked.  Performed at [facility], [address] [place], [place] [address]   CULTURE, BLOOD (ROUTINE X 2) w Reflex to ID Panel     Status: None (Preliminary result)    Collection Time: 04/24/21 11:37 PM   Specimen: BLOOD  Result Value Ref Range Status   Specimen Description BLOOD LEFT FOREARM  Final   Special Requests   Final    BOTTLES DRAWN AEROBIC AND ANAEROBIC Blood Culture results may not be optimal due to an inadequate volume of blood received in culture bottles   Culture   Final    NO GROWTH 4 DAYS Performed at [facility], [address] [place], [place] [address]    Report Status PENDING  Incomplete  CULTURE, BLOOD (ROUTINE X 2) w Reflex to ID Panel     Status: None (Preliminary result)   Collection Time: 04/24/21 11:37 PM   Specimen: BLOOD  Result Value Ref Range Status   Specimen Description BLOOD RIGHT  ASSIST CONTROL  Final   Special Requests   Final    BOTTLES DRAWN AEROBIC AND ANAEROBIC Blood Culture adequate volume   Culture   Final    NO GROWTH 4 DAYS Performed at [facility], [address] [place], [place] [address]    Report Status PENDING  Incomplete  MRSA Next Gen by PCR, Nasal     Status: None   Collection Time: 04/25/21 12:50 AM   Specimen: Nasal Mucosa; Nasal Swab  Result Value Ref Range Status   MRSA by PCR Next Gen NOT DETECTED NOT DETECTED Final    Comment: (NOTE) The GeneXpert MRSA Assay (FDA approved for NASAL specimens only), is one component of a comprehensive MRSA colonization surveillance program. It is not intended to diagnose MRSA infection nor to guide or monitor treatment for MRSA infections. Test performance is not FDA approved in patients less than 51 years old. Performed at [facility], [address] [place], [place] [address]       Radiology Studies: DG Swallowing [facility] Pathology  Result Date: 04/28/2021 Table formatting from the original result was not included. Objective Swallowing Evaluation: Type of Study: MBS-Modified Barium Swallow Study  Patient Details Name: James Dickerson MRN: [id number] Date of Birth: [birth year] Today's Date: 04/28/2021 Time: SLP Start  Time (ACUTE ONLY): 0820 -SLP Stop Time (ACUTE ONLY): 0900 SLP Time Calculation (min) (ACUTE ONLY): 40 min Past Medical History: Past Medical History: Diagnosis Date  Back pain   Cardiomegaly   COPD (chronic obstructive pulmonary disease) ([place])   Cystic kidney disease   Depressive disorder   GERD (gastroesophageal reflux disease)   High cholesterol   History of prostate cancer   Hypertension   Prostate cancer ([place]) 2018  Rad seeds  Thyroid nodule  Past Surgical History: Past Surgical History: Procedure Laterality Date  ABDOMINAL AORTIC ENDOVASCULAR STENT GRAFT N/A 06/22/2020  Procedure: ABDOMINAL AORTIC ENDOVASCULAR STENT GRAFT;  Surgeon: Cherre Robins, MD;  Location: [place];  Service: Vascular;  Laterality: N/A;  [place] N/A 06/22/2020  Procedure: CYSTOSCOPY WITH URETHRAL DILATATION;  Surgeon: Alexis Frock, MD;  Location: [place];  Service: Urology;  Laterality: N/A;  INSERTION OF ILIAC STENT Right 06/22/2020  Procedure: INSERTION OF RIGHT EXTERNAL ILIAC ARTERY STENT;  Surgeon: Cherre Robins, MD;  Location: [place];  Service: Vascular;  Laterality: Right;  INSERTION OF SUPRAPUBIC CATHETER N/A 06/22/2020  Procedure: INSERTION OF SUPRAPUBIC CATHETER;  Surgeon: Alexis Frock, MD;  Location: [place];  Service: Urology;  Laterality: N/A;  PACEMAKER INSERTION N/A 08/06/2016  Procedure: INSERTION PACEMAKER;  Surgeon: Isaias Cowman, MD;  Location: [facility];  Service: Cardiovascular;  Laterality: N/A; HPI: Per admitting H&P'Barnes Lauderback is a 80 y.o. African-American male with medical history significant for COPD, depression, GERD, dyslipidemia and hypertension, who presented to the ER with acute onset of altered mental status with recent recurrent falls.  He had a subsequent contusion to his right cheek.  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  Most recent chest CT (04/24/2021) revealed an increase in the degree of enhancement in the saccular aneurysm consistent with some worsening mural hematoma. This area now measures 5.9 x 3.6 cm (previously measuring 5.3 x 3.2 cm) the  more distal descending thoracic aorta shows some admixture abnormalities although no true dissection flap is seen.  Subjective: pt pleasant, alert, aphonic  Recommendations for follow up therapy are one component of a multi-disciplinary discharge planning process, led by the attending physician.  Recommendations may be updated based on patient status, additional functional criteria and insurance authorization. Assessment / Plan / Recommendation Clinical Impressions 04/28/2021 Clinical Impression Pt demonstrates moderate oral phase dysphagia. Pt consumed 2 trials of nectar thick liquids via spoon, 1 trial of nectar thick liquid via cup and 1 trial of thin liquids via spoon. His oral phase is c/b weak lingual manipulation of boluses resulting in decreased posterior propulsion of each bolus, premature spillage and piecemeal swallowing. Pt's pharyngeal phase is c/b swallow initiation at the level of the vallecula with no evidence of penetration or aspiration. After consuming the above 4 trials, pt became SOB, RR up to 34, air hungry, unable to control breathing despite heavy cues for pursed lip breaths, O2 dropping into the 70's with difficulty recovering. Rapid response arrived and placed pt on supplemental O2 with sats recovering. Recommend further trials of thin liquids at bedside under close monitoring of ICU. SLP Visit Diagnosis Dysphagia, oral phase (R13.11) Attention and concentration deficit following -- Frontal lobe and executive function deficit following -- Impact on safety and function Mild aspiration risk    Treatment Recommendations 04/28/2021 Treatment Recommendations Therapy as outlined in treatment plan below   Prognosis 04/28/2021 Prognosis for Safe Diet Advancement Fair Barriers to Reach Goals Cognitive deficits Barriers/Prognosis Comment -- Diet Recommendations 04/28/2021 SLP Diet Recommendations Dysphagia 1 (Puree) solids;Nectar thick liquid Liquid Administration via Cup Medication Administration Crushed with puree Compensations Minimize environmental distractions;Slow rate;Small sips/bites Postural Changes Seated upright at 90 degrees   Other Recommendations 04/28/2021 Recommended Consults -- Oral Care Recommendations Oral care BID Other Recommendations Order thickener from pharmacy;Prohibited food (jello, ice cream, thin soups);Remove water pitcher Follow Up Recommendations Skilled nursing-short term rehab (<3 hours/day) Assistance recommended at discharge Frequent or constant Supervision/Assistance Functional Status Assessment Patient has had a recent decline in their functional status and demonstrates the ability to make significant improvements in function in a reasonable and predictable amount of time. Frequency and Duration  04/28/2021 Speech Therapy Frequency (ACUTE ONLY) min 3x week Treatment Duration 2 weeks   Oral Phase 04/28/2021 Oral Phase Impaired Oral - Pudding Teaspoon -- Oral - Pudding Cup -- Oral - Honey Teaspoon -- Oral - Honey Cup -- Oral - Nectar Teaspoon Weak lingual manipulation;Reduced posterior propulsion;Piecemeal swallowing;Decreased bolus cohesion;Delayed oral transit;Premature spillage;Lingual/palatal residue Oral - Nectar Cup Weak lingual manipulation;Reduced posterior propulsion;Piecemeal swallowing;Lingual/palatal residue;Delayed oral transit;Decreased bolus cohesion;Premature spillage;Incomplete tongue to palate contact Oral - Nectar Straw -- Oral - Thin Teaspoon Weak lingual manipulation;Incomplete tongue to palate contact;Reduced posterior propulsion;Lingual/palatal residue;Piecemeal  swallowing;Delayed oral transit;Decreased bolus cohesion;Premature spillage Oral - Thin Cup -- Oral - Thin Straw -- Oral - Puree -- Oral - Mech Soft -- Oral - Regular -- Oral - Multi-Consistency -- Oral - Pill -- Oral Phase - Comment --  Pharyngeal Phase 04/28/2021 Pharyngeal Phase Impaired Pharyngeal- Pudding Teaspoon -- Pharyngeal -- Pharyngeal- Pudding Cup -- Pharyngeal -- Pharyngeal- Honey Teaspoon -- Pharyngeal -- Pharyngeal- Honey Cup -- Pharyngeal -- Pharyngeal- Nectar Teaspoon Delayed swallow initiation-vallecula Pharyngeal -- Pharyngeal- Nectar Cup Delayed swallow initiation-vallecula Pharyngeal -- Pharyngeal- Nectar Straw -- Pharyngeal -- Pharyngeal- Thin Teaspoon Delayed swallow initiation-vallecula Pharyngeal -- Pharyngeal- Thin Cup -- Pharyngeal -- Pharyngeal- Thin Straw -- Pharyngeal -- Pharyngeal- Puree -- Pharyngeal -- Pharyngeal- Mechanical Soft -- Pharyngeal --  Pharyngeal- Regular -- Pharyngeal -- Pharyngeal- Multi-consistency -- Pharyngeal -- Pharyngeal- Pill -- Pharyngeal -- Pharyngeal Comment --  Cervical Esophageal Phase  04/28/2021 Cervical Esophageal Phase WFL Pudding Teaspoon -- Pudding Cup -- Honey Teaspoon -- Honey Cup -- Nectar Teaspoon -- Nectar Cup -- Nectar Straw -- Thin Teaspoon -- Thin Cup -- Thin Straw -- Puree -- Mechanical Soft -- Regular -- Multi-consistency -- Pill -- Cervical Esophageal Comment -- Happi B. Rutherford Nail, M.S., CCC-SLP, [facility] Speech-Language Pathologist Rehabilitation Services Office [phone] Stormy Fabian 04/28/2021, 9:44 AM                      Scheduled Meds:  amLODipine  10 mg Oral Daily   aspirin EC  81 mg Oral Q0600   atorvastatin  40 mg Oral QHS   cephALEXin  500 mg Oral Q8H   Chlorhexidine Gluconate Cloth  6 each Topical Daily   cholecalciferol  2,000 Units Oral Daily   donepezil  10 mg Oral Daily   feeding supplement (NEPRO CARB STEADY)  237 mL Oral TID BM   irbesartan  150 mg Oral Daily   megestrol  40 mg Oral Daily   mirabegron ER  25 mg Oral  Daily   multivitamin with minerals  1 tablet Oral Daily   pantoprazole  40 mg Oral BID   tamsulosin  0.4 mg Oral Daily   Continuous Infusions:  esmolol Stopped (04/28/21 0807)     LOS: 4 days   Time spent: 42 minutes. More than 50% of the time was spent in counseling/coordination of care with consultants and family.  Lorella Nimrod, MD [facility]  If 7PM-7AM, please contact night-coverage [email or web address]  04/28/2021, 3:06 PM   This record has been created using Systems analyst. Errors have been sought and corrected,but may not always be located. Such creation errors do not reflect on the standard of care.

## 2021-04-29 ENCOUNTER — Inpatient Hospital Stay: Payer: Medicare Other

## 2021-04-29 LAB — CULTURE, BLOOD (ROUTINE X 2)
Culture: NO GROWTH
Culture: NO GROWTH
Special Requests: ADEQUATE

## 2021-04-29 NOTE — Progress Notes (Signed)
Pt assessed and morning needs addressed. No complaints verbalized for now. Vitals stable. Pt for transfer to 2A. Report given to [facility].  0845 - Pt transported to 233, cardiac monitoring to continue.

## 2021-04-29 NOTE — Progress Notes (Addendum)
After reviewing the chart, epic notes, labs, and imaging, I assessed the patient at bedside.  Patient was being taken to ultrasound. Palliative medicine will continue to monitor the patient throughout his hospitalization.  Apopka Ilsa Iha, FNP-BC Palliative Medicine Team Team Phone # [phone]   NO CHARGE

## 2021-04-29 NOTE — Progress Notes (Signed)
PROGRESS NOTE    James Dickerson  UKG:[id number] DOB: 1940-09-07 DOA: 04/24/2021 PCP: Pcp, No   Brief Narrative: Taken from H&P. [name] is a 80 y.o. African-American male with medical history significant for COPD, depression, GERD, dyslipidemia and hypertension, who presented to the ER with acute onset of altered mental status with recent recurrent falls.  He had a subsequent contusion to his right cheek.  Patient was recently moved to a different group home on Wednesday.  On presentation to ED patient had markedly elevated blood pressure, tachycardic mom, mildly tachypneic, labs only pertinent for mild hypokalemia.  COVID-19 and influenza PCR negative.  UA concerning for UTI.  Multiple imaging for concern of falls and injuries shows multiple fractures which include right fifth and sixth rib, T12, and right proximal fibula.  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  A small amount of arterial contrast extended into the presumed hematoma, concern for leaking aneurysm.  Vascular surgery was consulted and they are recommending conservative management with blood pressure control per admitting provider note, has not seen any notes from vascular surgery yet. He was started on esmolol infusion for better control of hypertension. CT head, cervical spine and sinuses were without any acute findings.  Orthopedic was consulted this morning, notify Dr. Mack Guise about right proximal fibular fracture.  Talked with daughter today and according to her patient has very poor functional status at baseline, bed bound most of the time, not eating and drinking.  They moved him to a different group home recently.  He has 2 daughters, 1 lives in [place].  It looks like family is not much involved in his care.  Discussed his full code with her and with her  permission changed status to DNR with full scope of medical care as performing a CPR on him will be more damaging and fruitless.  Extremely malnourished elderly man, very poor functional status, aphasic, poor p.o. intake and advanced dementia at baseline, very high risk for deterioration and death.  Palliative care was also consulted.  12/3: Vascular surgery per their recommendations today, stating that patient needs to transfer to [facility] for a possible thoracic saccular aneurysm repair.  Contacted the [facility] transfer line.  They gave me the name of Dr. Mali Hughes at [facility]. Patient was more alert and able to participate with feeding and taking meds today.  He started taking some p.o. intake. 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  4:16 PM: Received a call back from [facility] cardiothoracic team stating that they reviewed the imaging and there is no acute need to perform the surgery, he needs to follow-up with Dr. Ysidro Evert as an outpatient.  12/5: Patient underwent barium swallow studies today, no obvious aspiration noted but he did desaturated during the procedure requiring rapid response, saturation quickly improved with supplemental oxygen and he was subsequently weaned off to room air.  12/6; blood pressure controlled without infusion now.  Transfer to progressive care.  Medically stable for discharge to SNF, he will need to follow-up with cardiothoracic surgery at [facility] as an outpatient.  Subjective: Patient was seen and examined today.  No new complaints.  Assessment & Plan:   Principal Problem:   Altered mental  status Active Problems:   Thoracic aortic aneurysm (TAA)   Lower urinary tract infectious disease   Contusion  Altered mental status with recent recurrent falls/multiple fractures.  Resolved.  Resulted in multiple fractures which  include right fifth and sixth rib, T12 and right proximal fibula.  Patient has an history of advanced dementia so not sure how much mental status is changed from baseline.  Very poor functional status, mostly bedbound and poor p.o. intake for a while with declining health per daughter. -PT/OT is recommending SNF -Orthopedic surgery was consulted-and they are recommending conservative management. -Vascular surgery ordered carotid vascular ultrasound.  Sepsis.  Patient met sepsis criteria with tachycardia, tachypnea and a presumed UTI.  Preliminary blood cultures negative so far.  Urine cultures pending.  Procalcitonin elevated at 0.44 and mildly elevated lactic acid at 2.5 >>2.4.  Urine culture with pansensitive E. Coli. -Ceftriaxone was switched with Keflex to complete a 5-day course, completed the course.   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  Vascular surgery was consulted from ED, no formal note yet but according to admitting provider note that they advised conservative management with blood pressure control.  He was started on esmolol infusion. -Continue with esmolol infusion to keep the systolic below 482. -Continue home dose of amlodipine and Diovan.  -Patient need to transfer to [facility] for a potential saccular aortic arch aneurysm repair as it is leaking and high risk for rupture.  Called [facility] transfer line-talked with Dr. Mali Hughes team and they are recommending outpatient follow-up as there is no need for acute transfer. -Currently medically stable to participate with PT for disposition, they are recommending SNF -Patient needs outpatient follow-up with cardiothoracic surgery at [facility] -Very poor family support.  History of prior CVA.  CT head negative for any acute findings.  -Continue home dose of statin -Swallow team recommended dysphagia 2 diet.  Hypokalemia.  Resolved -Monitor potassium and replete as needed.  Hypertensive urgency.  Blood pressure  currently within goal and esmolol infusion is being tapered down after adding amlodipine yesterday.   -Continue amlodipine -Continue with esmolol infusion-try to wean completely -Continue with home meds  Severe protein caloric malnutrition.  Severe muscle wasting and poor p.o. intake. -Dietitian consult  Elevated troponin.  Mildly elevated troponin, most likely secondary to demand ischemia.  No chest pain.  Troponin with a flat curve.  Dyslipidemia. -Continue with statin  Dementia. -Continue with home dose of Aricept  History of overactive bladder. Continue home dose of Myrbetriq  BPH. -Continue home dose of Flomax  COPD without exacerbation. -Continue as needed bronchodilators  GERD. -Continue PPI  Objective: Vitals:   04/29/21 0700 04/29/21 0752 04/29/21 0800 04/29/21 0933  BP: 135/87  (!) 145/90 (!) 128/96  Pulse: 73 83 97 88  Resp: _0 Temp:  98.8 F (37.1 C)  98 F (36.7 C)  TempSrc:  Axillary  Axillary  SpO2: 100% 100% 100% 98%  Weight:      Height:        Intake/Output Summary (Last 24 hours) at 04/29/2021 1346 Last data filed at 04/29/2021 0950 Gross per 24 hour  Intake 720 ml  Output 1000 ml  Net -280 ml    Filed Weights   04/24/21 1338  Weight: 63.5 kg    Examination:  General.  Frail and cachectic gentleman, in no acute distress. Pulmonary.  Lungs clear bilaterally, normal respiratory effort. CV.  Regular rate and rhythm, no JVD, rub or murmur. Abdomen.  Soft,  nontender, nondistended, BS positive. CNS.  Alert and oriented .  No focal neurologic deficit. Extremities.  No edema, no cyanosis, pulses intact and symmetrical. Psychiatry.  Judgment and insight appears normal.   DVT prophylaxis: SCDs, patient has leaking thoracic aneurysm with mural thrombus Code Status: DNR-discussed with daughter and changed the status from full code to DNR with full scope of medical care Family Communication:  Disposition Plan:  Status is:  Inpatient  Remains inpatient appropriate because: Severity of illness.  Level of care: Progressive  All the records are reviewed and case discussed with Care Management/Social Worker. Management plans discussed with the patient, nursing and they are in agreement.  Consultants:  Vascular surgery Orthopedic surgery  Procedures:  Antimicrobials:  Keflex  Data Reviewed: I have personally reviewed following labs and imaging studies  CBC: Recent Labs  Lab 04/24/21 1341 04/25/21 0218  WBC 8.7 8.8  HGB 12.5* 12.8*  HCT 38.0* 39.2  MCV 93.6 93.8  PLT 154 145*    Basic Metabolic Panel: Recent Labs  Lab 04/24/21 1341 04/25/21 0218  NA 135 136  K 3.2* 3.6  CL 101 104  CO2 26 22  GLUCOSE 117* 113*  BUN 21 23  CREATININE 1.19 1.13  CALCIUM 9.2 9.2    GFR: Estimated Creatinine Clearance: 41.3 mL/min (by C-G formula based on SCr of 1.13 mg/dL). Liver Function Tests: No results for input(s): AST, ALT, ALKPHOS, BILITOT, PROT, ALBUMIN in the last 168 hours. No results for input(s): LIPASE, AMYLASE in the last 168 hours. No results for input(s): AMMONIA in the last 168 hours. Coagulation Profile: Recent Labs  Lab 04/25/21 0218  INR 1.4*    Cardiac Enzymes: No results for input(s): CKTOTAL, CKMB, CKMBINDEX, TROPONINI in the last 168 hours. BNP (last 3 results) No results for input(s): PROBNP in the last 8760 hours. HbA1C: No results for input(s): HGBA1C in the last 72 hours. CBG: Recent Labs  Lab 04/25/21 0040  GLUCAP 116*    Lipid Profile: No results for input(s): CHOL, HDL, LDLCALC, TRIG, CHOLHDL, LDLDIRECT in the last 72 hours. Thyroid Function Tests: No results for input(s): TSH, T4TOTAL, FREET4, T3FREE, THYROIDAB in the last 72 hours. Anemia Panel: No results for input(s): VITAMINB12, FOLATE, FERRITIN, TIBC, IRON, RETICCTPCT in the last 72 hours. Sepsis Labs: Recent Labs  Lab 04/24/21 2337 04/25/21 0218 04/25/21 0913  PROCALCITON  --  0.44  --    LATICACIDVEN 2.9* 2.5* 2.4*     Recent Results (from the past 240 hour(s))  Urine Culture     Status: Abnormal   Collection Time: 04/24/21  7:16 PM   Specimen: Urine, Clean Catch  Result Value Ref Range Status   Specimen Description   Final    URINE, CLEAN CATCH Performed at [facility], [address], [place], [place] [address]    Special Requests   Final    NONE Performed at [facility], [address] [place], [place] [address]    Culture >=100,000 COLONIES/mL ESCHERICHIA COLI (A)  Final   Report Status 04/26/2021 FINAL  Final   Organism ID, Bacteria ESCHERICHIA COLI (A)  Final      Susceptibility   Escherichia coli - MIC*    AMPICILLIN 8 SENSITIVE Sensitive     CEFAZOLIN <=4 SENSITIVE Sensitive     CEFEPIME <=0.12 SENSITIVE Sensitive     CEFTRIAXONE <=0.25 SENSITIVE Sensitive     CIPROFLOXACIN <=0.25 SENSITIVE Sensitive     GENTAMICIN <=1 SENSITIVE Sensitive     IMIPENEM <=0.25 SENSITIVE  Sensitive     NITROFURANTOIN <=16 SENSITIVE Sensitive     TRIMETH/SULFA <=20 SENSITIVE Sensitive     AMPICILLIN/SULBACTAM 4 SENSITIVE Sensitive     PIP/TAZO <=4 SENSITIVE Sensitive     * >=100,000 COLONIES/mL ESCHERICHIA COLI  Resp Panel by RT-PCR (Flu A&B, Covid) Nasopharyngeal Swab     Status: None   Collection Time: 04/24/21  9:12 PM   Specimen: Nasopharyngeal Swab; Nasopharyngeal(NP) swabs in vial transport medium  Result Value Ref Range Status   SARS Coronavirus 2 by RT PCR NEGATIVE NEGATIVE Final    Comment: (NOTE) SARS-CoV-2 target nucleic acids are NOT DETECTED.  The SARS-CoV-2 RNA is generally detectable in upper respiratory specimens during the acute phase of infection. The lowest concentration of SARS-CoV-2 viral copies this assay can detect is 138 copies/mL. A negative result does not preclude SARS-Cov-2 infection and should not be used as the sole basis for treatment or other patient management decisions. A negative result may occur with   improper specimen collection/handling, submission of specimen other than nasopharyngeal swab, presence of viral mutation(s) within the areas targeted by this assay, and inadequate number of viral copies(<138 copies/mL). A negative result must be combined with clinical observations, patient history, and epidemiological information. The expected result is Negative.  Fact Sheet for Patients:  EntrepreneurPulse.com.au  Fact Sheet for Healthcare Providers:  IncredibleEmployment.be  This test is no t yet approved or cleared by the [place] FDA and  has been authorized for detection and/or diagnosis of SARS-CoV-2 by FDA under an Emergency Use Authorization (EUA). This EUA will remain  in effect (meaning this test can be used) for the duration of the COVID-19 declaration under Section 564(b)(1) of the Act, 21 U.S.C.section 360bbb-3(b)(1), unless the authorization is terminated  or revoked sooner.       Influenza A by PCR NEGATIVE NEGATIVE Final   Influenza B by PCR NEGATIVE NEGATIVE Final    Comment: (NOTE) The Xpert Xpress SARS-CoV-2/FLU/RSV plus assay is intended as an aid in the diagnosis of influenza from Nasopharyngeal swab specimens and should not be used as a sole basis for treatment. Nasal washings and aspirates are unacceptable for Xpert Xpress SARS-CoV-2/FLU/RSV testing.  Fact Sheet for Patients: EntrepreneurPulse.com.au  Fact Sheet for Healthcare Providers: IncredibleEmployment.be  This test is not yet approved or cleared by the [place] FDA and has been authorized for detection and/or diagnosis of SARS-CoV-2 by FDA under an Emergency Use Authorization (EUA). This EUA will remain in effect (meaning this test can be used) for the duration of the COVID-19 declaration under Section 564(b)(1) of the Act, 21 U.S.C. section 360bbb-3(b)(1), unless the authorization is terminated  or revoked.  Performed at [facility], [address] [place], [place] [address]   CULTURE, BLOOD (ROUTINE X 2) w Reflex to ID Panel     Status: None   Collection Time: 04/24/21 11:37 PM   Specimen: BLOOD  Result Value Ref Range Status   Specimen Description BLOOD LEFT FOREARM  Final   Special Requests   Final    BOTTLES DRAWN AEROBIC AND ANAEROBIC Blood Culture results may not be optimal due to an inadequate volume of blood received in culture bottles   Culture   Final    NO GROWTH 5 DAYS Performed at [facility], [address], [place], [place] [address]    Report Status 04/29/2021 FINAL  Final  CULTURE, BLOOD (ROUTINE X 2) w Reflex to ID Panel     Status: None   Collection Time: 04/24/21  11:37 PM   Specimen: BLOOD  Result Value Ref Range Status   Specimen Description BLOOD RIGHT ASSIST CONTROL  Final   Special Requests   Final    BOTTLES DRAWN AEROBIC AND ANAEROBIC Blood Culture adequate volume   Culture   Final    NO GROWTH 5 DAYS Performed at [facility], [address] [place], [place] [address]    Report Status 04/29/2021 FINAL  Final  MRSA Next Gen by PCR, Nasal     Status: None   Collection Time: 04/25/21 12:50 AM   Specimen: Nasal Mucosa; Nasal Swab  Result Value Ref Range Status   MRSA by PCR Next Gen NOT DETECTED NOT DETECTED Final    Comment: (NOTE) The GeneXpert MRSA Assay (FDA approved for NASAL specimens only), is one component of a comprehensive MRSA colonization surveillance program. It is not intended to diagnose MRSA infection nor to guide or monitor treatment for MRSA infections. Test performance is not FDA approved in patients less than 70 years old. Performed at [facility], [address], [place], [place] [address]       Radiology Studies: DG Swallowing [facility] Pathology  Result Date: 04/28/2021 Table formatting from the original result was not included. Objective Swallowing Evaluation: Type  of Study: MBS-Modified Barium Swallow Study  Patient Details Name: Manuelito Poage MRN: [id number] Date of Birth: Sep 08, 1940 Today's Date: 04/28/2021 Time: SLP Start Time (ACUTE ONLY): 0820 -SLP Stop Time (ACUTE ONLY): 0900 SLP Time Calculation (min) (ACUTE ONLY): 40 min Past Medical History: Past Medical History: Diagnosis Date  Back pain   Cardiomegaly   COPD (chronic obstructive pulmonary disease) ([place])   Cystic kidney disease   Depressive disorder   GERD (gastroesophageal reflux disease)   High cholesterol   History of prostate cancer   Hypertension   Prostate cancer ([place]) 2018  Rad seeds  Thyroid nodule  Past Surgical History: Past Surgical History: Procedure Laterality Date  ABDOMINAL AORTIC ENDOVASCULAR STENT GRAFT N/A 06/22/2020  Procedure: ABDOMINAL AORTIC ENDOVASCULAR STENT GRAFT;  Surgeon: Cherre Robins, MD;  Location: [place];  Service: Vascular;  Laterality: N/A;  [place] N/A 06/22/2020  Procedure: CYSTOSCOPY WITH URETHRAL DILATATION;  Surgeon: Alexis Frock, MD;  Location: [place];  Service: Urology;  Laterality: N/A;  INSERTION OF ILIAC STENT Right 06/22/2020  Procedure: INSERTION OF RIGHT EXTERNAL ILIAC ARTERY STENT;  Surgeon: Cherre Robins, MD;  Location: [place];  Service: Vascular;  Laterality: Right;  INSERTION OF SUPRAPUBIC CATHETER N/A 06/22/2020  Procedure: INSERTION OF SUPRAPUBIC CATHETER;  Surgeon: Alexis Frock, MD;  Location: [place];  Service: Urology;  Laterality: N/A;  PACEMAKER INSERTION N/A 08/06/2016  Procedure: INSERTION PACEMAKER;  Surgeon: Isaias Cowman, MD;  Location: [facility];  Service: Cardiovascular;  Laterality: N/A; HPI: Per admitting H&P'Ezrah Huesman is a 80 y.o. African-American male with medical history significant for COPD, depression, GERD, dyslipidemia and hypertension, who presented to the ER with acute onset of altered mental status with recent recurrent falls.  He had a subsequent contusion to his right cheek.  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  Most recent chest CT (04/24/2021) revealed an increase in the degree of enhancement in the saccular aneurysm consistent with  some worsening mural hematoma. This area now measures 5.9 x 3.6 cm (previously measuring 5.3 x 3.2 cm) the more distal descending thoracic aorta shows some admixture abnormalities although no true dissection flap is seen.  Subjective: pt pleasant, alert, aphonic  Recommendations for follow up therapy are one component of a multi-disciplinary discharge planning process, led by the attending physician.  Recommendations may be updated based on patient status, additional functional criteria and insurance authorization. Assessment / Plan / Recommendation Clinical Impressions 04/28/2021 Clinical Impression Pt demonstrates moderate oral phase dysphagia. Pt consumed 2 trials of nectar thick liquids via spoon, 1 trial of nectar thick liquid via cup and 1 trial of thin liquids via spoon. His oral phase is c/b weak lingual manipulation of boluses resulting in decreased posterior propulsion of each bolus, premature spillage and piecemeal swallowing. Pt's pharyngeal phase is c/b swallow initiation at the level of the vallecula with no evidence of penetration or aspiration. After consuming the above 4 trials, pt became SOB, RR up to 34, air hungry, unable to control breathing despite heavy cues for pursed lip breaths, O2 dropping into the 70's with difficulty recovering. Rapid response arrived and placed pt on supplemental O2 with sats recovering. Recommend further trials of thin liquids at bedside under close monitoring of ICU. SLP Visit Diagnosis Dysphagia, oral phase (R13.11)  Attention and concentration deficit following -- Frontal lobe and executive function deficit following -- Impact on safety and function Mild aspiration risk   Treatment Recommendations 04/28/2021 Treatment Recommendations Therapy as outlined in treatment plan below   Prognosis 04/28/2021 Prognosis for Safe Diet Advancement Fair Barriers to Reach Goals Cognitive deficits Barriers/Prognosis Comment -- Diet Recommendations 04/28/2021 SLP Diet Recommendations Dysphagia 1 (Puree) solids;Nectar thick liquid Liquid Administration via Cup Medication Administration Crushed with puree Compensations Minimize environmental distractions;Slow rate;Small sips/bites Postural Changes Seated upright at 90 degrees   Other Recommendations 04/28/2021 Recommended Consults -- Oral Care Recommendations Oral care BID Other Recommendations Order thickener from pharmacy;Prohibited food (jello, ice cream, thin soups);Remove water pitcher Follow Up Recommendations Skilled nursing-short term rehab (<3 hours/day) Assistance recommended at discharge Frequent or constant Supervision/Assistance Functional Status Assessment Patient has had a recent decline in their functional status and demonstrates the ability to make significant improvements in function in a reasonable and predictable amount of time. Frequency and Duration  04/28/2021 Speech Therapy Frequency (ACUTE ONLY) min 3x week Treatment Duration 2 weeks   Oral Phase 04/28/2021 Oral Phase Impaired Oral - Pudding Teaspoon -- Oral - Pudding Cup -- Oral - Honey Teaspoon -- Oral - Honey Cup -- Oral - Nectar Teaspoon Weak lingual manipulation;Reduced posterior propulsion;Piecemeal swallowing;Decreased bolus cohesion;Delayed oral transit;Premature spillage;Lingual/palatal residue Oral - Nectar Cup Weak lingual manipulation;Reduced posterior propulsion;Piecemeal swallowing;Lingual/palatal residue;Delayed oral transit;Decreased bolus cohesion;Premature spillage;Incomplete tongue to palate contact Oral -  Nectar Straw -- Oral - Thin Teaspoon Weak lingual manipulation;Incomplete tongue to palate contact;Reduced posterior propulsion;Lingual/palatal residue;Piecemeal swallowing;Delayed oral transit;Decreased bolus cohesion;Premature spillage Oral - Thin Cup -- Oral - Thin Straw -- Oral - Puree -- Oral - Mech Soft -- Oral - Regular -- Oral - Multi-Consistency -- Oral - Pill -- Oral Phase - Comment --  Pharyngeal Phase 04/28/2021 Pharyngeal Phase Impaired Pharyngeal- Pudding Teaspoon -- Pharyngeal -- Pharyngeal- Pudding Cup -- Pharyngeal -- Pharyngeal- Honey Teaspoon -- Pharyngeal -- Pharyngeal- Honey Cup -- Pharyngeal -- Pharyngeal- Nectar Teaspoon Delayed swallow initiation-vallecula Pharyngeal -- Pharyngeal- Nectar Cup Delayed swallow initiation-vallecula Pharyngeal -- Pharyngeal- Nectar Straw -- Pharyngeal -- Pharyngeal- Thin Teaspoon Delayed swallow initiation-vallecula Pharyngeal -- Pharyngeal- Thin Cup --  Pharyngeal -- Pharyngeal- Thin Straw -- Pharyngeal -- Pharyngeal- Puree -- Pharyngeal -- Pharyngeal- Mechanical Soft -- Pharyngeal -- Pharyngeal- Regular -- Pharyngeal -- Pharyngeal- Multi-consistency -- Pharyngeal -- Pharyngeal- Pill -- Pharyngeal -- Pharyngeal Comment --  Cervical Esophageal Phase  04/28/2021 Cervical Esophageal Phase WFL Pudding Teaspoon -- Pudding Cup -- Honey Teaspoon -- Honey Cup -- Nectar Teaspoon -- Nectar Cup -- Nectar Straw -- Thin Teaspoon -- Thin Cup -- Thin Straw -- Puree -- Mechanical Soft -- Regular -- Multi-consistency -- Pill -- Cervical Esophageal Comment -- Happi B. Rutherford Nail, M.S., CCC-SLP, [facility] Speech-Language Pathologist Rehabilitation Services Office [phone] Stormy Fabian 04/28/2021, 9:44 AM                      Scheduled Meds:  amLODipine  10 mg Oral Daily   aspirin EC  81 mg Oral Q0600   atorvastatin  40 mg Oral QHS   Chlorhexidine Gluconate Cloth  6 each Topical Daily   cholecalciferol  2,000 Units Oral Daily   donepezil  10 mg Oral Daily   feeding supplement  (NEPRO CARB STEADY)  237 mL Oral TID BM   irbesartan  150 mg Oral Daily   megestrol  40 mg Oral Daily   mirabegron ER  25 mg Oral Daily   multivitamin with minerals  1 tablet Oral Daily   pantoprazole sodium  40 mg Oral BID   tamsulosin  0.4 mg Oral Daily   Continuous Infusions:  esmolol Stopped (04/28/21 0807)     LOS: 5 days   Time spent: 38 minutes. More than 50% of the time was spent in counseling/coordination of care with consultants and family.  Lorella Nimrod, MD [facility]  If 7PM-7AM, please contact night-coverage [email or web address]  04/29/2021, 1:46 PM   This record has been created using Systems analyst. Errors have been sought and corrected,but may not always be located. Such creation errors do not reflect on the standard of care.

## 2021-04-29 NOTE — Evaluation (Addendum)
Physical Therapy Evaluation Patient Details Name: James Dickerson MRN: [id number] DOB: [birth year] Today's Date: 04/29/2021  History of Present Illness  80 y.o. male  with past medical history of COPD, depression, GERD, dyslipidemia, HTN, BPH, overactive bladder, prior CVA, and dementia admitted on 04/24/2021 with altered mental status and recurrent falls.  During admission imaging reflects multiple fractures (right fifth and sixth ribs, T12, and right proximal fibula).  CT of chest and abdomen revealed enlarging mural hematoma and concern for leaking aneurysm.   Clinical Impression  Patient alert, oriented to self and place (stated hospital with extra time). Initially denied pain but with mobility and sitting pt did exhibit moderate pain signs/symptoms, referenced LBP. Family contacted to obtain accurate PLOF (Daughter Rentz). Per family up until the last 2-3 months the patient was independent in ADLs and mobility. He has experienced a decline to be predominantly bed bound with assistance needed for all ADLs and recently transitioned to a rest home, instead of a group home (~about 24hrs prior to hospital admission). Family also references that he started taking a lot of new medications prior to his declined. Stated he's had >15 falls in the last 3 months.  Seen with OT to maximize pt/therapist safety as well as pt function. The patient needed multimodal cues throughout with intermittent repetition of one step commands. Noted to have trouble with initiation of task and needed re-direction as well. Initiated log rolling in bed, ultimately minAx2 to come up into sitting. Able to reposition to EOB with anterior scoot with CGA. Sit <> Stand with minAx2 for safety, and RW. He did take several steps to recliner, minA to maintain safety (occasional posterior lean noted) as well as assistance with RW. Of note, with exertion or prior to intiating the task, pt with elevated RR. Vitals WFLs throughout.  Overall the  patient demonstrated deficits (see "PT Problem List") that impede the patient's functional abilities, safety, and mobility and would benefit from skilled PT intervention. Recommendation at this time is SNF due to recent decline in functional mobility and current level of assistance needed depending on pt goals and POC.   BP measured in supine and sitting, systolic below 194 throughout. spO2 WFLs on room air.      Recommendations for follow up therapy are one component of a multi-disciplinary discharge planning process, led by the attending physician.  Recommendations may be updated based on patient status, additional functional criteria and insurance authorization.  Follow Up Recommendations Skilled nursing-short term rehab (<3 hours/day)    Assistance Recommended at Discharge Frequent or constant Supervision/Assistance  Functional Status Assessment Patient has had a recent decline in their functional status and demonstrates the ability to make significant improvements in function in a reasonable and predictable amount of time.  Equipment Recommendations  Other (comment) (TBD based on pt/family goals; if [facility] to rest home, will need RW, BSC, potentially WC)    Recommendations for Other Services       Precautions / Restrictions Precautions Precautions: Fall Precaution Comments: fibluar fx, if complaining of significant pain can have cam walker per Dr. Harden Mo consult note Restrictions Weight Bearing Restrictions: Yes RLE Weight Bearing: Weight bearing as tolerated Other Position/Activity Restrictions: rib fxs      Mobility  Bed Mobility Overal bed mobility: Needs Assistance Bed Mobility: Rolling;Sidelying to Sit Rolling: Min assist Sidelying to sit: Min assist;+2 for safety/equipment;HOB elevated       General bed mobility comments: step by step seuqencing provided to maximize pt participation, extra time alloted. ultimately minAx2  for trunk elevation to come up into  sitting    Transfers Overall transfer level: Needs assistance Equipment used: Rolling walker (2 wheels) Transfers: Sit to/from Stand;Bed to chair/wheelchair/BSC Sit to Stand: Min assist;+2 physical assistance   Step pivot transfers: +2 safety/equipment;Min assist       General transfer comment: assist with hand placement, RW guidance, and sequencing to transition to chair safely    Ambulation/Gait                  Stairs            Wheelchair Mobility    Modified Rankin (Stroke Patients Only)       Balance Overall balance assessment: Needs assistance Sitting-balance support: Feet supported;Bilateral upper extremity supported Sitting balance-Leahy Scale: Fair Sitting balance - Comments: preferred BUE support at EOB for balance Postural control: Posterior lean Standing balance support: Reliant on assistive device for balance Standing balance-Leahy Scale: Poor                               Pertinent Vitals/Pain Pain Assessment: Faces Faces Pain Scale: Hurts little more Pain Location: with mobility attempts, in sitting did describe back pain Pain Descriptors / Indicators: Grimacing;Aching;Guarding;Moaning Pain Intervention(s): Limited activity within patient's tolerance;Monitored during session;Repositioned    Home Living Family/patient expects to be discharged to:: Other (Comment) ("rest home" per family, had just transitioned from group home less than 24 hrs prior to presenting to ED)                        Prior Function Prior Level of Function : Needs assist       Physical Assist : Mobility (physical);ADLs (physical) Mobility (physical): Bed mobility;Transfers ADLs (physical): Feeding;Grooming;Bathing;Dressing;Toileting Mobility Comments: prior to 2-3 months ago, pt was independent with mobility. recent decline at group home led to transition to rest home per family. >15 falls in the last 3 months ADLs Comments: prior to 2-3  months ago, pt was independent with ADLs, recent decline at group home led to transition to rest home per family, felt that the patient had "given up" and stated he was started on a bunch of new medicines prior to decline     Hand Dominance        Extremity/Trunk Assessment   Upper Extremity Assessment Upper Extremity Assessment: Defer to OT evaluation    Lower Extremity Assessment Lower Extremity Assessment:  (able to lift extremities against gravity, true MMT deferred due to pain/cognition)    Cervical / Trunk Assessment Cervical / Trunk Assessment: Kyphotic  Communication   Communication: Other (comment) (whispers)  Cognition Arousal/Alertness: Awake/alert Behavior During Therapy: WFL for tasks assessed/performed Overall Cognitive Status: No family/caregiver present to determine baseline cognitive functioning                                 General Comments: pt oriented to self, hospital. disoriented to situation, time. often redirected to task at hand, difficulty initiating tasks noted, delayed responses        General Comments      Exercises     Assessment/Plan    PT Assessment Patient needs continued PT services  PT Problem List Decreased strength;Decreased mobility;Decreased range of motion;Decreased activity tolerance;Decreased balance;Pain;Decreased knowledge of use of DME       PT Treatment Interventions DME instruction;Therapeutic exercise;Gait training;Balance training;Stair training;Functional mobility training;Cognitive  remediation;Neuromuscular re-education;Therapeutic activities;Patient/family education    PT Goals (Current goals can be found in the Care Plan section)  Acute Rehab PT Goals Patient Stated Goal: to get stronger PT Goal Formulation: With family Time For Goal Achievement: 05/13/21 Potential to Achieve Goals: Fair    Frequency Min 2X/week   Barriers to discharge        Co-evaluation PT/OT/SLP Co-Evaluation/Treatment:  Yes Reason for Co-Treatment: For patient/therapist safety;To address functional/ADL transfers;Necessary to address cognition/behavior during functional activity PT goals addressed during session: Mobility/safety with mobility;Proper use of DME;Balance OT goals addressed during session: ADL's and self-care;Strengthening/ROM       AM-PAC PT "6 Clicks" Mobility  Outcome Measure Help needed turning from your back to your side while in a flat bed without using bedrails?: A Little Help needed moving from lying on your back to sitting on the side of a flat bed without using bedrails?: A Lot Help needed moving to and from a bed to a chair (including a wheelchair)?: A Little Help needed standing up from a chair using your arms (e.g., wheelchair or bedside chair)?: A Little Help needed to walk in hospital room?: A Lot Help needed climbing 3-5 steps with a railing? : Total 6 Click Score: 14    End of Session   Activity Tolerance: Patient tolerated treatment well Patient left: in chair;with chair alarm set;with call bell/phone within reach Nurse Communication: Mobility status PT Visit Diagnosis: Other abnormalities of gait and mobility (R26.89);Difficulty in walking, not elsewhere classified (R26.2);Muscle weakness (generalized) (M62.81)    Time: 5726-2035 PT Time Calculation (min) (ACUTE ONLY): 26 min   Charges:   PT Evaluation $PT Eval Moderate Complexity: 1 Mod PT Treatments $Therapeutic Activity: 8-22 mins      Lieutenant Diego PT, DPT 11:11 AM,04/29/21

## 2021-04-29 NOTE — Evaluation (Signed)
Occupational Therapy Evaluation Patient Details Name: James Dickerson MRN: [id number] DOB: [birth year] Today's Date: 04/29/2021   History of Present Illness 80 y.o. male  with past medical history of COPD, depression, GERD, dyslipidemia, HTN, BPH, overactive bladder, prior CVA, and dementia admitted on 04/24/2021 with altered mental status and recurrent falls.  During admission imaging reflects multiple fractures (right fifth and sixth ribs, T12, and right proximal fibula).  CT of chest and abdomen revealed enlarging mural hematoma and concern for leaking aneurysm.   Clinical Impression   Pt seen for OT evaluation this date. Session coordinated with PT to address cognition and maximize safety during during functional/ADL transfers. Pt was alert and oriented to self and was aware he was in a hospital; disoriented to date and situation. Family contacted by PT to obtain PLOF/home set-up d/t pt being unable to remember. Prior to 2-3 months ago, pt was independent with ADLs and functional mobility, living in a group home. Family reports that pt's recent decline at group home led to transition to rest home. Pt currently presents with decreased memory, increased difficulty sequencing functional mobility, decreased strength, and decreased activity tolerance. Due to these functional impairments, pt requires MIN A for bed-level LB dressing, MIN A+2 for supine>sit transfers, and MIN A+2 for stand pivot transfers bed>recliner. Once seated in recliner, breakfast tray arrived to room. Following SET-UP assist, pt able to take x6 bites of food, without any swallowing difficulty noted; RN informed. Pt would benefit from additional skilled OT services to maximize return to PLOF and minimize risk of future falls, injury, caregiver burden, and readmission. Upon discharge, recommend SNF.         Recommendations for follow up therapy are one component of a multi-disciplinary discharge planning process, led by the attending  physician.  Recommendations may be updated based on patient status, additional functional criteria and insurance authorization.   Follow Up Recommendations  Skilled nursing-short term rehab (<3 hours/day)    Assistance Recommended at Discharge Frequent or constant Supervision/Assistance  Functional Status Assessment  Patient has had a recent decline in their functional status and demonstrates the ability to make significant improvements in function in a reasonable and predictable amount of time.  Equipment Recommendations  Other (comment) (defer ot next venue of care)       Precautions / Restrictions Precautions Precautions: Fall Precaution Comments: fibluar fx, if complaining of significant pain can have cam walker per Dr. Harden Mo consult note Restrictions Weight Bearing Restrictions: Yes RLE Weight Bearing: Weight bearing as tolerated Other Position/Activity Restrictions: rib fxs      Mobility Bed Mobility Overal bed mobility: Needs Assistance Bed Mobility: Rolling;Sidelying to Sit Rolling: Min assist Sidelying to sit: Min assist;+2 for safety/equipment;HOB elevated       General bed mobility comments: step by step seuqencing provided to maximize pt participation, extra time alloted. ultimately minAx2 for trunk elevation to come up into sitting    Transfers Overall transfer level: Needs assistance Equipment used: Rolling walker (2 wheels) Transfers: Sit to/from Stand;Bed to chair/wheelchair/BSC Sit to Stand: Min assist;+2 physical assistance     Step pivot transfers: +2 safety/equipment;Min assist     General transfer comment: assist with hand placement, RW guidance, and sequencing to transition to chair safely      Balance Overall balance assessment: Needs assistance Sitting-balance support: Feet supported;Bilateral upper extremity supported Sitting balance-Leahy Scale: Fair Sitting balance - Comments: preferred BUE support at EOB for balance Postural  control: Posterior lean Standing balance support: Bilateral upper extremity supported;During functional activity  Standing balance-Leahy Scale: Poor Standing balance comment: Requires MIN A+2 for stand pivot transfers with RW                           ADL either performed or assessed with clinical judgement   ADL Overall ADL's : Needs assistance/impaired Eating/Feeding: Set up;Sitting                   Lower Body Dressing: Minimal assistance;Bed level Lower Body Dressing Details (indicate cue type and reason): to don/doff socks. requires MIN A to thread over toes when donning                     Vision Baseline Vision/History:  (unable to recall if he wears glasses) Ability to See in Adequate Light: 0 Adequate Patient Visual Report: No change from baseline              Pertinent Vitals/Pain Pain Assessment: Faces Faces Pain Scale: Hurts little more Pain Location: with mobility attempts, in sitting did describe back pain Pain Descriptors / Indicators: Grimacing;Aching;Guarding;Moaning Pain Intervention(s): Limited activity within patient's tolerance;Monitored during session;Repositioned        Extremity/Trunk Assessment Upper Extremity Assessment Upper Extremity Assessment: RUE deficits/detail;Generalized weakness RUE Deficits / Details: Pt unable to flex shoulder >30 degrees, with pt grimacing in pain (unable to describe why). At least 3+/5 in all other movements RUE: Unable to fully assess due to pain   Lower Extremity Assessment Lower Extremity Assessment: Generalized weakness   Cervical / Trunk Assessment Cervical / Trunk Assessment: Kyphotic   Communication Communication Communication: Other (comment) (whispers)   Cognition Arousal/Alertness: Awake/alert Behavior During Therapy: WFL for tasks assessed/performed Overall Cognitive Status: No family/caregiver present to determine baseline cognitive functioning                                  General Comments: pt oriented to self, hospital. disoriented to situation, time. often redirected to task at hand, difficulty initiating tasks noted, delayed responses                Home Living Family/patient expects to be discharged to:: Other (Comment) ("rest home" per family, had just transitioned from group home less than 24 hrs prior to presenting to ED)                                        Prior Functioning/Environment Prior Level of Function : Needs assist       Physical Assist : Mobility (physical);ADLs (physical) Mobility (physical): Bed mobility;Transfers ADLs (physical): Feeding;Grooming;Bathing;Dressing;Toileting Mobility Comments: prior to 2-3 months ago, pt was independent with mobility. recent decline at group home led to transition to rest home per family. >15 falls in the last 3 months ADLs Comments: prior to 2-3 months ago, pt was independent with ADLs, recent decline at group home led to transition to rest home per family, felt that the patient had "given up" and stated he was started on a bunch of new medicines prior to decline        OT Problem List: Decreased strength;Decreased activity tolerance;Impaired balance (sitting and/or standing);Decreased cognition;Pain      OT Treatment/Interventions: Self-care/ADL training;Therapeutic exercise;Energy conservation;DME and/or AE instruction;Therapeutic activities;Patient/family education;Balance training    OT Goals(Current goals can be found in the care plan  section) Acute Rehab OT Goals Patient Stated Goal: to get stronger OT Goal Formulation: With family Time For Goal Achievement: 05/13/21 Potential to Achieve Goals: Good ADL Goals Pt Will Perform Grooming: with min assist;standing Pt Will Transfer to Toilet: with min guard assist;stand pivot transfer;bedside commode Pt Will Perform Toileting - Clothing Manipulation and hygiene: with min guard assist;sitting/lateral leans   OT Frequency: Min 2X/week           Co-evaluation PT/OT/SLP Co-Evaluation/Treatment: Yes Reason for Co-Treatment: Necessary to address cognition/behavior during functional activity;For patient/therapist safety PT goals addressed during session: Mobility/safety with mobility OT goals addressed during session: ADL's and self-care      AM-PAC OT "6 Clicks" Daily Activity     Outcome Measure Help from another person eating meals?: None Help from another person taking care of personal grooming?: A Little Help from another person toileting, which includes using toliet, bedpan, or urinal?: A Little Help from another person bathing (including washing, rinsing, drying)?: A Little Help from another person to put on and taking off regular upper body clothing?: A Little Help from another person to put on and taking off regular lower body clothing?: A Little 6 Click Score: 19   End of Session Equipment Utilized During Treatment: Rolling walker (2 wheels) Nurse Communication: Mobility status  Activity Tolerance: Patient tolerated treatment well Patient left: in chair;with call bell/phone within reach;with chair alarm set  OT Visit Diagnosis: Unsteadiness on feet (R26.81);Muscle weakness (generalized) (M62.81);History of falling (Z91.81)                Time: 3267-1245 OT Time Calculation (min): 25 min Charges:  OT General Charges $OT Visit: 1 Visit OT Evaluation $OT Eval Moderate Complexity: 1 Mod OT Treatments $Self Care/Home Management : 8-22 mins  Fredirick Maudlin, OTR/L [place]

## 2021-04-30 NOTE — TOC Initial Note (Signed)
Transition of Care [facility]) - Initial/Assessment Note    Patient Details  Name: James Dickerson MRN: [id number] Date of Birth: [birth year]  Transition of Care [facility]) CM/SW Contact:    Eileen Stanford, LCSW Phone Number: 04/30/2021, 10:27 AM  Clinical Narrative:  Pt is only alert to self. CSW spoke with his daughter Josephina Shih. Pt's daughter is agreeable for pt to go to SNF prior to return to group home however request pt does not go to [facility]. Pt's daughter would like [facility] placement. CSW will send referral and reach back out to daughter with bed offers.                 Expected Discharge Plan: Skilled Nursing Facility Barriers to Discharge: Insurance Authorization   Patient Goals and CMS Choice Patient states their goals for this hospitalization and ongoing recovery are:: for pt to get better   Choice offered to / list presented to : Adult Children  Expected Discharge Plan and Services Expected Discharge Plan: [place] In-house Referral: Clinical Social Work   Post Acute Care Choice: [place] Living arrangements for the past 2 months: Group Home                                      Prior Living Arrangements/Services Living arrangements for the past 2 months: Group Home Lives with:: Facility Resident Patient language and need for interpreter reviewed:: Yes Do you feel safe going back to the place where you live?: Yes      Need for Family Participation in Patient Care: Yes (Comment) Care giver support system in place?: Yes (comment)   Criminal Activity/Legal Involvement Pertinent to Current Situation/Hospitalization: No - Comment as needed  Activities of Daily Living   ADL Screening (condition at time of admission) Patient's cognitive ability adequate to safely complete daily activities?: No Is the patient deaf or have difficulty hearing?: No Does the patient have difficulty seeing, even when wearing glasses/contacts?: No Does the patient  have difficulty concentrating, remembering, or making decisions?: Yes Patient able to express need for assistance with ADLs?: No Does the patient have difficulty dressing or bathing?: Yes Independently performs ADLs?: No Dressing (OT): Needs assistance Is this a change from baseline?: Pre-admission baseline Grooming: Needs assistance Is this a change from baseline?: Pre-admission baseline Feeding: Needs assistance Is this a change from baseline?: Pre-admission baseline Bathing: Needs assistance Is this a change from baseline?: Pre-admission baseline Toileting: Needs assistance Is this a change from baseline?: Pre-admission baseline In/Out Bed: Needs assistance Is this a change from baseline?: Pre-admission baseline Walks in Home: Needs assistance Does the patient have difficulty walking or climbing stairs?: Yes Weakness of Legs: Both Weakness of Arms/Hands: Both  Permission Sought/Granted Permission sought to share information with : Family Supports Permission granted to share information with : Yes, Release of Information Signed  Share Information with NAME: sissy     Permission granted to share info w Relationship: daughter     Emotional Assessment Appearance:: Appears stated age Attitude/Demeanor/Rapport: Unable to Assess Affect (typically observed): Unable to Assess Orientation: : Oriented to Self Alcohol / Substance Use: Not Applicable Psych Involvement: No (comment)  Admission diagnosis:  Lower urinary tract infectious disease [N39.0] Swelling [R60.9] Altered mental status [R41.82] Hip pain [M25.559] Contusion [T14.8XXA] Thoracic aortic aneurysm (TAA), unspecified part, unspecified whether ruptured [I71.20] Patient Active Problem List   Diagnosis Date Noted   Contusion  Altered mental status 04/24/2021   Protein-calorie malnutrition, severe 07/24/2020   Hematuria 07/23/2020   Lower urinary tract infectious disease 07/22/2020   Foley catheter in place  07/22/2020   GERD (gastroesophageal reflux disease) 07/22/2020   Elevated lactic acid level 07/22/2020   Gross hematuria 07/22/2020   AAA (abdominal aortic aneurysm) 06/23/2020   Status post surgery 06/22/2020   Thoracic aortic aneurysm (TAA) 12/18/2019   Respiratory failure ([place]) 07/11/2017   Acute respiratory failure () 07/10/2017   CAP (community acquired pneumonia) 07/10/2017   Influenza A 07/10/2017   COPD (chronic obstructive pulmonary disease) ([place]) 07/10/2017   HLD (hyperlipidemia) 03/09/2017   Essential hypertension 08/14/2016   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

## 2021-04-30 NOTE — Progress Notes (Signed)
Physical Therapy Treatment Patient Details Name: James Dickerson MRN: [id number] DOB: [birth year] Today's Date: 04/30/2021   History of Present Illness 80 y.o. male  with past medical history of COPD, depression, GERD, dyslipidemia, HTN, BPH, overactive bladder, prior CVA, and dementia admitted on 04/24/2021 with altered mental status and recurrent falls.  During admission imaging reflects multiple fractures (right fifth and sixth ribs, T12, and right proximal fibula).  CT of chest and abdomen revealed enlarging mural hematoma and concern for leaking aneurysm.    PT Comments    Patient alert, agreeable to PT at start of session, but did need intermittent encouragement to continue to participate and put in effort. BP at rest was 89/55 (66 MAP). A few supine exercises performed and BM noted, rolling to L and R with minA and pericare performed totalA. After bed activities BP 92/63 (74 MAP). Log roll technique performed to minimize pain, step by step sequencing needed and minA to complete. BP in sitting comparable to previous BP reading. With encouragement pt performed sit <> Stand with minA and RW, able to take a few steps towards [facility], but pt returns to sitting spontaneously, further mobility attempts deferred. The patient would benefit from further skilled PT intervention to continue to progress towards goals. Recommendation remains appropriate.    Recommendations for follow up therapy are one component of a multi-disciplinary discharge planning process, led by the attending physician.  Recommendations may be updated based on patient status, additional functional criteria and insurance authorization.  Follow Up Recommendations  Skilled nursing-short term rehab (<3 hours/day)     Assistance Recommended at Discharge Frequent or constant Supervision/Assistance  Equipment Recommendations  Other (comment) (TBD based on pt/family goals; if [place] to rest home, will need RW, BSC, potentially WC)     Recommendations for Other Services       Precautions / Restrictions Precautions Precautions: Fall Precaution Comments: fibluar fx, if complaining of significant pain can have cam walker per Dr. Harden Mo consult note Restrictions Weight Bearing Restrictions: Yes RLE Weight Bearing: Weight bearing as tolerated Other Position/Activity Restrictions: rib fxs     Mobility  Bed Mobility Overal bed mobility: Needs Assistance Bed Mobility: Rolling;Sidelying to Sit Rolling: Min assist Sidelying to sit: Min assist;+2 for safety/equipment;HOB elevated       General bed mobility comments: step by step seuqencing provided to maximize pt participation, extra time alloted. pt with more pain this session    Transfers Overall transfer level: Needs assistance Equipment used: Rolling walker (2 wheels) Transfers: Sit to/from Stand;Bed to chair/wheelchair/BSC Sit to Stand: Min assist           General transfer comment: pt declined further OOB mobility today. able to take several steps towards [facility] with minA and RW, pt in a rush, somewhat unsteady    Ambulation/Gait                   Stairs             Wheelchair Mobility    Modified Rankin (Stroke Patients Only)       Balance Overall balance assessment: Needs assistance Sitting-balance support: Feet supported Sitting balance-Leahy Scale: Fair Sitting balance - Comments: preferred BUE support at EOB for balance                                    Cognition Arousal/Alertness: Awake/alert Behavior During Therapy: WFL for tasks assessed/performed Overall Cognitive Status: No family/caregiver  present to determine baseline cognitive functioning                                          Exercises      General Comments        Pertinent Vitals/Pain Pain Assessment: Faces Faces Pain Scale: Hurts even more Pain Location: indicates back/side pain, most likely rib pain from  fxs Pain Descriptors / Indicators: Grimacing;Aching;Guarding;Moaning Pain Intervention(s): Limited activity within patient's tolerance;Monitored during session;Repositioned    Home Living                          Prior Function            PT Goals (current goals can now be found in the care plan section) Progress towards PT goals: Progressing toward goals    Frequency    Min 2X/week      PT Plan Current plan remains appropriate    Co-evaluation              AM-PAC PT "6 Clicks" Mobility   Outcome Measure  Help needed turning from your back to your side while in a flat bed without using bedrails?: A Little Help needed moving from lying on your back to sitting on the side of a flat bed without using bedrails?: A Lot Help needed moving to and from a bed to a chair (including a wheelchair)?: A Little Help needed standing up from a chair using your arms (e.g., wheelchair or bedside chair)?: A Little Help needed to walk in hospital room?: A Lot Help needed climbing 3-5 steps with a railing? : Total 6 Click Score: 14    End of Session   Activity Tolerance: Patient limited by fatigue;Patient limited by pain Patient left: in bed;with bed alarm set;with call bell/phone within reach Nurse Communication: Mobility status PT Visit Diagnosis: Other abnormalities of gait and mobility (R26.89);Difficulty in walking, not elsewhere classified (R26.2);Muscle weakness (generalized) (M62.81)     Time: 4944-9675 PT Time Calculation (min) (ACUTE ONLY): 24 min  Charges:  $Therapeutic Activity: 23-37 mins                     Lieutenant Diego PT, DPT 1:49 PM,04/30/21

## 2021-04-30 NOTE — Progress Notes (Signed)
Nutrition Follow-up  DOCUMENTATION CODES:   Severe malnutrition in context of chronic illness  INTERVENTION:   -Continue feeding assistance with meals -Continue MVI with minerals daily -Continue Nepro Shake po TID, each supplement provides 425 kcal and 19 grams protein  -Continue Magic cup TID with meals, each supplement provides 290 kcal and 9 grams of protein  -Continue Vital Cuisine Shake TID with meals, each supplement provides 520 kcals and 22 grams protein   NUTRITION DIAGNOSIS:   Severe Malnutrition related to chronic illness (COPD) as evidenced by severe fat depletion, severe muscle depletion.  Ongoing  GOAL:   Patient will meet greater than or equal to 90% of their needs  Progressing   MONITOR:   PO intake, Supplement acceptance, Diet advancement, Labs, Weight trends, Skin, I & O's  REASON FOR ASSESSMENT:   Malnutrition Screening Tool    ASSESSMENT:   James Dickerson is a 79 y.o. African-American male with medical history significant for COPD, depression, GERD, dyslipidemia and hypertension, who presented to the ER with acute onset of altered mental status with recent recurrent falls.  He had a subsequent contusion to his right cheek.  He was complaining in triage from chest pain as well as hip pain.  The patient is a poor historian due to his altered mental status.  12/2- s/p BSE- downgraded to dysphagia 1 diet with nectar thick liquids 12/5- s/p MBSS- continue with dysphagia 1 diet with nectar thick liquids  Reviewed I/O's: +1.2 L x 24 hours and +2.2 L since admission  UOP: 350 ml x 24 hours   Pt sleeping soundly at time of visit. He did not awaken when name was called.  Pt remains on a dysphagia 1 diet with thin liquids. Noted meal completions 10-50%. Breakfast tray was unattempted. Noted pt consumed about 75% of Nepro shake.   Per TOC notes, plan to d/c to SNF once medically stable for discharge.   Palliative care following for goals of care discussions.    Medications reviewed and include vitamin D3.  Labs reviewed: CBGS: 116.   Diet Order:   Diet Order             DIET - DYS 1 Room service appropriate? Yes; Fluid consistency: Nectar Thick  Diet effective now                   EDUCATION NEEDS:   Not appropriate for education at this time  Skin:  Skin Assessment: Skin Integrity Issues: Skin Integrity Issues:: Other (Comment) Stage I: - Other: eccymosis on buttocks and back  Last BM:  04/30/21  Height:   Ht Readings from Last 1 Encounters:  04/24/21 5\' 3"  (1.6 m)    Weight:   Wt Readings from Last 1 Encounters:  04/24/21 63.5 kg    Ideal Body Weight:  56.3 kg  BMI:  Body mass index is 24.8 kg/m.  Estimated Nutritional Needs:   Kcal:  2000-2200  Protein:  110-125 grams  Fluid:  > 2 L    James Dickerson, RD, LDN, [place] Registered Dietitian II Certified Diabetes Care and Education Specialist Please refer to [facility] for RD and/or RD on-call/weekend/after hours pager

## 2021-04-30 NOTE — TOC Progression Note (Signed)
Transition of Care [facility]) - Progression Note    Patient Details  Name: James Dickerson MRN: [id number] Date of Birth: [birth year]  Transition of Care [facility]) CM/SW Contact  Eileen Stanford, LCSW Phone Number: 04/30/2021, 3:10 PM  Clinical Narrative: CSW provided bed offers to [place] and she chooses [place]. CSW will start auth.      Expected Discharge Plan: Skilled Nursing Facility Barriers to Discharge: Insurance Authorization  Expected Discharge Plan and Services Expected Discharge Plan: [place] In-house Referral: Clinical Social Work   Post Acute Care Choice: [place] Living arrangements for the past 2 months: Group Home                                       Social Determinants of Health (SDOH) Interventions    Readmission Risk Interventions No flowsheet data found.

## 2021-04-30 NOTE — NC FL2 (Signed)
[place] LEVEL OF CARE SCREENING TOOL     IDENTIFICATION  Patient Name: James Dickerson Birthdate: 24-Jan-1941 Sex: male Admission Date (Current Location): 04/24/2021  [facility] and [place] Number:  Engineering geologist and Address:  [facility], [address] [place], [place] [address]      Provider Number: [id number]  Attending Physician Name and Address:  Lorella Nimrod, MD  Relative Name and Phone Number:       Current Level of Care: Hospital Recommended Level of Care: [place] Prior Approval Number:    Date Approved/Denied:   PASRR Number: [id number]  Discharge Plan: SNF    Current Diagnoses: Patient Active Problem List   Diagnosis Date Noted   Contusion    Altered mental status 04/24/2021   Protein-calorie malnutrition, severe 07/24/2020   Hematuria 07/23/2020   Lower urinary tract infectious disease 07/22/2020   Foley catheter in place 07/22/2020   GERD (gastroesophageal reflux disease) 07/22/2020   Elevated lactic acid level 07/22/2020   Gross hematuria 07/22/2020   AAA (abdominal aortic aneurysm) 06/23/2020   Status post surgery 06/22/2020   Thoracic aortic aneurysm (TAA) 12/18/2019   Respiratory failure ([place]) 07/11/2017   Acute respiratory failure ([place]) 07/10/2017   CAP (community acquired pneumonia) 07/10/2017   Influenza A 07/10/2017   COPD (chronic obstructive pulmonary disease) ([place]) 07/10/2017   HLD (hyperlipidemia) 03/09/2017   Essential hypertension 08/14/2016   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   Bradycardia 08/05/2016    Orientation RESPIRATION BLADDER Height & Weight     Self  Normal Incontinent Weight: 139 lb 15.9 oz (63.5 kg) Height:  5\' 3"  (160 cm)  BEHAVIORAL SYMPTOMS/MOOD NEUROLOGICAL BOWEL NUTRITION STATUS      Incontinent Diet (DYS 1 diet, nectar thick liquids)  AMBULATORY STATUS COMMUNICATION OF NEEDS Skin   Extensive Assist Verbally Normal                        Personal Care Assistance Level of Assistance  Bathing, Feeding, Dressing Bathing Assistance: Maximum assistance Feeding assistance: Independent Dressing Assistance: Maximum assistance     Functional Limitations Info  Sight, Hearing, Speech Sight Info: Adequate Hearing Info: Adequate Speech Info: Adequate    SPECIAL CARE FACTORS FREQUENCY  PT (By licensed PT), OT (By licensed OT)     PT Frequency: 5x OT Frequency: 5x            Contractures Contractures Info: Not present    Additional Factors Info  Code Status, Allergies Code Status Info: DNR Allergies Info: no known allergies           Current Medications (04/30/2021):  This is the current hospital active medication list Current Facility-Administered Medications  Medication Dose Route Frequency Provider Last Rate Last Admin   acetaminophen (TYLENOL) tablet 650 mg  650 mg Oral Q6H PRN Mansy, Jan A, MD       Or   acetaminophen (TYLENOL) suppository 650 mg  650 mg Rectal Q6H PRN Mansy, Jan A, MD       amLODipine (NORVASC) tablet 10 mg  10 mg Oral Daily Lorella Nimrod, MD   10 mg at 04/30/21 0831   aspirin EC tablet 81 mg  81 mg Oral Q0600 Mansy, Jan A, MD   81 mg at 04/30/21 0519   atorvastatin (LIPITOR) tablet 40 mg  40 mg Oral QHS Mansy, Jan A, MD   40 mg at 04/29/21 2108   Chlorhexidine Gluconate Cloth 2 %  PADS 6 each  6 each Topical Daily Mansy, Jan A, MD   6 each at 04/30/21 2902   cholecalciferol (VITAMIN D3) tablet 2,000 Units  2,000 Units Oral Daily Mansy, Jan A, MD   2,000 Units at 04/30/21 0831   donepezil (ARICEPT) tablet 10 mg  10 mg Oral Daily Mansy, Jan A, MD   10 mg at 04/30/21 0831   feeding supplement (NEPRO CARB STEADY) liquid 237 mL  237 mL Oral TID BM Lorella Nimrod, MD   237 mL at 04/30/21 1115   food thickener (SIMPLYTHICK (NECTAR/LEVEL 2/MILDLY THICK)) 1 packet  1 packet Oral PRN Lorella Nimrod, MD       hydrALAZINE (APRESOLINE) injection 10 mg  10 mg Intravenous Q6H PRN Mansy, Jan A, MD    10 mg at 04/28/21 0336   ipratropium-albuterol (DUONEB) 0.5-2.5 (3) MG/3ML nebulizer solution 3 mL  3 mL Inhalation Q6H PRN Mansy, Jan A, MD       irbesartan (AVAPRO) tablet 150 mg  150 mg Oral Daily Mansy, Jan A, MD   150 mg at 04/30/21 0831   magnesium hydroxide (MILK OF MAGNESIA) suspension 30 mL  30 mL Oral Daily PRN Mansy, Jan A, MD       megestrol (MEGACE) tablet 40 mg  40 mg Oral Daily Mansy, Jan A, MD   40 mg at 04/30/21 0831   mirabegron ER (MYRBETRIQ) tablet 25 mg  25 mg Oral Daily Mansy, Jan A, MD   25 mg at 04/30/21 0831   morphine 2 MG/ML injection 2 mg  2 mg Intravenous Q4H PRN Mansy, Jan A, MD   2 mg at 04/25/21 0450   multivitamin with minerals tablet 1 tablet  1 tablet Oral Daily Lorella Nimrod, MD   1 tablet at 04/30/21 0831   ondansetron (ZOFRAN) tablet 4 mg  4 mg Oral Q6H PRN Mansy, Jan A, MD       Or   ondansetron [facility]) injection 4 mg  4 mg Intravenous Q6H PRN Mansy, Jan A, MD       pantoprazole sodium (PROTONIX) 40 mg/20 mL oral suspension 40 mg  40 mg Oral BID Benita Gutter, RPH   40 mg at 04/29/21 2108   tamsulosin (FLOMAX) capsule 0.4 mg  0.4 mg Oral Daily Mansy, Jan A, MD   0.4 mg at 04/30/21 0831   traZODone (DESYREL) tablet 25 mg  25 mg Oral QHS PRN Mansy, Arvella Merles, MD         Discharge Medications: Please see discharge summary for a list of discharge medications.  Relevant Imaging Results:  Relevant Lab Results:   Additional Information [id number]  Eileen Stanford, LCSW

## 2021-04-30 NOTE — Care Management Important Message (Signed)
Important Message  Patient Details  Name: James Dickerson MRN: [id number] Date of Birth: 29-Nov-1940   Medicare Important Message Given:  Yes  Patient asleep at the time of visit.  Copy of Medicare IM left on beside tray for reference.   Dannette Barbara 04/30/2021, 10:55 AM

## 2021-04-30 NOTE — Progress Notes (Signed)
Progress note:  After reviewing the patient's chart, epic notes, labs, and imaging, I assessed the patient at bedside.  He was pleasant and oriented only to self.  He was eating and drinking his lunch without shortness of breath or coughing.  He is in no apparent distress.  No family at bedside.  Palliative medicine will continue to monitor the patient throughout his hospitalization.  [place] Ilsa Iha, FNP-BC Palliative Medicine Team Team Phone # [phone]   NO CHARGE

## 2021-04-30 NOTE — Progress Notes (Signed)
PROGRESS NOTE    James Dickerson  UGQ:[id number] DOB: 1940/11/16 DOA: 04/24/2021 PCP: Pcp, No   Brief Narrative: Taken from H&P. James Dickerson is a 80 y.o. African-American male with medical history significant for COPD, depression, GERD, dyslipidemia and hypertension, who presented to the ER with acute onset of altered mental status with recent recurrent falls.  He had a subsequent contusion to his right cheek.  Patient was recently moved to a different group home on Wednesday.  On presentation to ED patient had markedly elevated blood pressure, tachycardic mom, mildly tachypneic, labs only pertinent for mild hypokalemia.  COVID-19 and influenza PCR negative.  UA concerning for UTI.  Multiple imaging for concern of falls and injuries shows multiple fractures which include right fifth and sixth rib, T12, and right proximal fibula.  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  A small amount of arterial contrast extended into the presumed hematoma, concern for leaking aneurysm.  Vascular surgery was consulted and they are recommending conservative management with blood pressure control per admitting provider note, has not seen any notes from vascular surgery yet. He was started on esmolol infusion for better control of hypertension. CT head, cervical spine and sinuses were without any acute findings.  Orthopedic was consulted this morning, notify Dr. Mack Guise about right proximal fibular fracture.  Talked with daughter today and according to her patient has very poor functional status at baseline, bed bound most of the time, not eating and drinking.  They moved him to a different group home recently.  He has 2 daughters, 1 lives in [place].  It looks like family is not much involved in his care.  Discussed his full code with her and with her  permission changed status to DNR with full scope of medical care as performing a CPR on him will be more damaging and fruitless.  Extremely malnourished elderly man, very poor functional status, aphasic, poor p.o. intake and advanced dementia at baseline, very high risk for deterioration and death.  Palliative care was also consulted.  12/3: Vascular surgery per their recommendations today, stating that patient needs to transfer to [facility] for a possible thoracic saccular aneurysm repair.  Contacted the [facility] transfer line.  They gave me the name of Dr. Mali Hughes at [facility]. Patient was more alert and able to participate with feeding and taking meds today.  He started taking some p.o. intake. 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  4:16 PM: Received a call back from [facility] cardiothoracic team stating that they reviewed the imaging and there is no acute need to perform the surgery, he needs to follow-up with Dr. Ysidro Evert as an outpatient.  12/5: Patient underwent barium swallow studies today, no obvious aspiration noted but he did desaturated during the procedure requiring rapid response, saturation quickly improved with supplemental oxygen and he was subsequently weaned off to room air.  12/6; blood pressure controlled without infusion now.  Transfer to progressive care.  Medically stable for discharge to SNF, he will need to follow-up with cardiothoracic surgery at [facility] as an outpatient.  12/7: TOC started the process to SNF.  Carotid ultrasound without any significant stenosis.  Subjective: Patient was seen and examined today.  No new complaints.  He was resting comfortably.  Assessment & Plan:   Principal Problem:   Altered mental status Active Problems:   Thoracic aortic aneurysm (TAA)   Lower urinary tract infectious disease    Contusion  Altered mental status with recent recurrent falls/multiple fractures.  Resolved.  Resulted in multiple fractures which include right fifth and sixth rib, T12 and right proximal fibula.  Patient has an history of advanced dementia so not sure how much mental status is changed from baseline.  Very poor functional status, mostly bedbound and poor p.o. intake for a while with declining health per daughter. -PT/OT is recommending SNF -Orthopedic surgery was consulted-and they are recommending conservative management. -Vascular surgery ordered carotid vascular ultrasound, which was without any significant abnormal malady or stenosis.  Sepsis.  Patient met sepsis criteria with tachycardia, tachypnea and a presumed UTI.  Preliminary blood cultures negative so far.  Urine cultures pending.  Procalcitonin elevated at 0.44 and mildly elevated lactic acid at 2.5 >>2.4.  Urine culture with pansensitive E. Coli. -Ceftriaxone was switched with Keflex to complete a 5-day course, completed the course.   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  Vascular surgery was consulted from ED, no formal note yet but according to admitting provider note that they advised conservative management with blood pressure control.  He was started on esmolol infusion. -Continue with esmolol infusion to keep the systolic below 935. -Continue home dose of amlodipine and Diovan.  -Patient need to transfer to [facility] for a potential saccular aortic arch aneurysm repair as it is leaking and high risk for rupture.  Called [facility] transfer line-talked with Dr. Mali Hughes team and they are recommending outpatient follow-up as there is no need for acute transfer. -Currently medically stable to participate with PT for disposition, they are recommending SNF -Patient needs outpatient follow-up with cardiothoracic surgery at [facility] -Very poor family support.  Aphonia.  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  History of prior CVA.  CT head negative for any acute findings.  -Continue home dose of statin -Swallow team recommended dysphagia 2 diet.  Hypokalemia.  Resolved -Monitor potassium and replete as needed.  Hypertensive urgency.  Blood pressure currently within goal after stopping the esmolol -Continue amlodipine -Continue with home meds  Severe protein caloric malnutrition.  Severe muscle wasting and poor p.o. intake. -Dietitian consult  Elevated troponin.  Mildly elevated troponin, most likely secondary to demand ischemia.  No chest pain.  Troponin with a flat curve.  Dyslipidemia. -Continue with statin  Dementia. -Continue with home dose of Aricept  History of overactive bladder. Continue home dose of Myrbetriq  BPH. -Continue home dose of Flomax  COPD without exacerbation. -Continue as needed bronchodilators  GERD. -Continue PPI  Objective: Vitals:   04/30/21 0029 04/30/21 0400 04/30/21 0735 04/30/21 1142  BP: 118/72 132/85 124/75 105/70  Pulse: 88 83 87 90  Resp: _0 Temp: 98.1 F (36.7 C) 98.1 F (36.7 C) 98.2 F (36.8 C) 98.4 F (36.9 C)  TempSrc:  Oral Axillary Oral  SpO2: 99% 99% 99% 98%  Weight:      Height:        Intake/Output Summary (Last 24 hours) at 04/30/2021 1520 Last data filed at 04/30/2021 1145 Gross per 24 hour  Intake 1197 ml  Output 400 ml  Net 797 ml    [employer]  04/24/21 1338  Weight: 63.5 kg    Examination:  General.  Cachectic gentleman, in no acute distress. Pulmonary.  Lungs clear bilaterally, normal respiratory effort. CV.  Regular rate and rhythm, no JVD, rub or murmur. Abdomen.  Soft, nontender, nondistended, BS positive. CNS.  Alert and oriented .  No focal neurologic deficit. Extremities.  No edema, no cyanosis, pulses intact and symmetrical. Psychiatry.  Judgment and insight appears normal.   DVT  prophylaxis: SCDs, patient has leaking thoracic aneurysm with mural thrombus Code Status: DNR-discussed with daughter and changed the status from full code to DNR with full scope of medical care Family Communication:  Disposition Plan:  Status is: Inpatient  Remains inpatient appropriate because: Severity of illness.  Level of care: Progressive  All the records are reviewed and case discussed with Care Management/Social Worker. Management plans discussed with the patient, nursing and they are in agreement.  Consultants:  Vascular surgery Orthopedic surgery  Procedures:  Antimicrobials:  Keflex-completed the course now.  Data Reviewed: I have personally reviewed following labs and imaging studies  CBC: Recent Labs  Lab 04/24/21 1341 04/25/21 0218  WBC 8.7 8.8  HGB 12.5* 12.8*  HCT 38.0* 39.2  MCV 93.6 93.8  PLT 154 145*    Basic Metabolic Panel: Recent Labs  Lab 04/24/21 1341 04/25/21 0218  NA 135 136  K 3.2* 3.6  CL 101 104  CO2 26 22  GLUCOSE 117* 113*  BUN 21 23  CREATININE 1.19 1.13  CALCIUM 9.2 9.2    GFR: Estimated Creatinine Clearance: 41.3 mL/min (by C-G formula based on SCr of 1.13 mg/dL). Liver Function Tests: No results for input(s): AST, ALT, ALKPHOS, BILITOT, PROT, ALBUMIN in the last 168 hours. No results for input(s): LIPASE, AMYLASE in the last 168 hours. No results for input(s): AMMONIA in the last 168 hours. Coagulation Profile: Recent Labs  Lab 04/25/21 0218  INR 1.4*    Cardiac Enzymes: No results for input(s): CKTOTAL, CKMB, CKMBINDEX, TROPONINI in the last 168 hours. BNP (last 3 results) No results for input(s): PROBNP in the last 8760 hours. HbA1C: No results for input(s): HGBA1C in the last 72 hours. CBG: Recent Labs  Lab 04/25/21 0040  GLUCAP 116*    Lipid Profile: No results for input(s): CHOL, HDL, LDLCALC, TRIG, CHOLHDL, LDLDIRECT in the last 72 hours. Thyroid Function Tests: No results for input(s): TSH,  T4TOTAL, FREET4, T3FREE, THYROIDAB in the last 72 hours. Anemia Panel: No results for input(s): VITAMINB12, FOLATE, FERRITIN, TIBC, IRON, RETICCTPCT in the last 72 hours. Sepsis Labs: Recent Labs  Lab 04/24/21 2337 04/25/21 0218 04/25/21 0913  PROCALCITON  --  0.44  --   LATICACIDVEN 2.9* 2.5* 2.4*     Recent Results (from the past 240 hour(s))  Urine Culture     Status: Abnormal   Collection Time: 04/24/21  7:16 PM   Specimen: Urine, Clean Catch  Result Value Ref Range Status   Specimen Description   Final    URINE, CLEAN CATCH Performed at [facility], [address] [place], [place] [address]    Special Requests   Final    NONE Performed at [facility], [address] [place], [place] [address]    Culture >=100,000 COLONIES/mL ESCHERICHIA COLI (A)  Final   Report Status 04/26/2021 FINAL  Final   Organism ID, Bacteria ESCHERICHIA COLI (A)  Final      Susceptibility   Escherichia coli - MIC*    AMPICILLIN 8 SENSITIVE Sensitive  CEFAZOLIN <=4 SENSITIVE Sensitive     CEFEPIME <=0.12 SENSITIVE Sensitive     CEFTRIAXONE <=0.25 SENSITIVE Sensitive     CIPROFLOXACIN <=0.25 SENSITIVE Sensitive     GENTAMICIN <=1 SENSITIVE Sensitive     IMIPENEM <=0.25 SENSITIVE Sensitive     NITROFURANTOIN <=16 SENSITIVE Sensitive     TRIMETH/SULFA <=20 SENSITIVE Sensitive     AMPICILLIN/SULBACTAM 4 SENSITIVE Sensitive     PIP/TAZO <=4 SENSITIVE Sensitive     * >=100,000 COLONIES/mL ESCHERICHIA COLI  Resp Panel by RT-PCR (Flu A&B, Covid) Nasopharyngeal Swab     Status: None   Collection Time: 04/24/21  9:12 PM   Specimen: Nasopharyngeal Swab; Nasopharyngeal(NP) swabs in vial transport medium  Result Value Ref Range Status   SARS Coronavirus 2 by RT PCR NEGATIVE NEGATIVE Final    Comment: (NOTE) SARS-CoV-2 target nucleic acids are NOT DETECTED.  The SARS-CoV-2 RNA is generally detectable in upper respiratory specimens during the acute phase of infection. The  lowest concentration of SARS-CoV-2 viral copies this assay can detect is 138 copies/mL. A negative result does not preclude SARS-Cov-2 infection and should not be used as the sole basis for treatment or other patient management decisions. A negative result may occur with  improper specimen collection/handling, submission of specimen other than nasopharyngeal swab, presence of viral mutation(s) within the areas targeted by this assay, and inadequate number of viral copies(<138 copies/mL). A negative result must be combined with clinical observations, patient history, and epidemiological information. The expected result is Negative.  Fact Sheet for Patients:  EntrepreneurPulse.com.au  Fact Sheet for Healthcare Providers:  IncredibleEmployment.be  This test is no t yet approved or cleared by the [place] FDA and  has been authorized for detection and/or diagnosis of SARS-CoV-2 by FDA under an Emergency Use Authorization (EUA). This EUA will remain  in effect (meaning this test can be used) for the duration of the COVID-19 declaration under Section 564(b)(1) of the Act, 21 U.S.C.section 360bbb-3(b)(1), unless the authorization is terminated  or revoked sooner.       Influenza A by PCR NEGATIVE NEGATIVE Final   Influenza B by PCR NEGATIVE NEGATIVE Final    Comment: (NOTE) The Xpert Xpress SARS-CoV-2/FLU/RSV plus assay is intended as an aid in the diagnosis of influenza from Nasopharyngeal swab specimens and should not be used as a sole basis for treatment. Nasal washings and aspirates are unacceptable for Xpert Xpress SARS-CoV-2/FLU/RSV testing.  Fact Sheet for Patients: EntrepreneurPulse.com.au  Fact Sheet for Healthcare Providers: IncredibleEmployment.be  This test is not yet approved or cleared by the [place] FDA and has been authorized for detection and/or diagnosis of SARS-CoV-2 by FDA under  an Emergency Use Authorization (EUA). This EUA will remain in effect (meaning this test can be used) for the duration of the COVID-19 declaration under Section 564(b)(1) of the Act, 21 U.S.C. section 360bbb-3(b)(1), unless the authorization is terminated or revoked.  Performed at [facility], [address] [place], [place] [address]   CULTURE, BLOOD (ROUTINE X 2) w Reflex to ID Panel     Status: None   Collection Time: 04/24/21 11:37 PM   Specimen: BLOOD  Result Value Ref Range Status   Specimen Description BLOOD LEFT FOREARM  Final   Special Requests   Final    BOTTLES DRAWN AEROBIC AND ANAEROBIC Blood Culture results may not be optimal due to an inadequate volume of blood received in culture bottles   Culture   Final    NO GROWTH 5 DAYS  Performed at [facility], [address] [place], [place] [address]    Report Status 04/29/2021 FINAL  Final  CULTURE, BLOOD (ROUTINE X 2) w Reflex to ID Panel     Status: None   Collection Time: 04/24/21 11:37 PM   Specimen: BLOOD  Result Value Ref Range Status   Specimen Description BLOOD RIGHT ASSIST CONTROL  Final   Special Requests   Final    BOTTLES DRAWN AEROBIC AND ANAEROBIC Blood Culture adequate volume   Culture   Final    NO GROWTH 5 DAYS Performed at [facility], [address], [place], [place] [address]    Report Status 04/29/2021 FINAL  Final  MRSA Next Gen by PCR, Nasal     Status: None   Collection Time: 04/25/21 12:50 AM   Specimen: Nasal Mucosa; Nasal Swab  Result Value Ref Range Status   MRSA by PCR Next Gen NOT DETECTED NOT DETECTED Final    Comment: (NOTE) The GeneXpert MRSA Assay (FDA approved for NASAL specimens only), is one component of a comprehensive MRSA colonization surveillance program. It is not intended to diagnose MRSA infection nor to guide or monitor treatment for MRSA infections. Test performance is not FDA approved in patients less than 32 years old. Performed at  [facility], [address] [place], [place] [address]       Radiology Studies: US Carotid Bilateral  Result Date: 04/29/2021 CLINICAL DATA:  Altered mental status. History of hypertension, hyperlipidemia and smoking. EXAM: BILATERAL CAROTID DUPLEX ULTRASOUND TECHNIQUE: Pearline Cables scale imaging, color Doppler and duplex ultrasound were performed of bilateral carotid and vertebral arteries in the neck. COMPARISON:  None. FINDINGS: Criteria: Quantification of carotid stenosis is based on velocity parameters that correlate the residual internal carotid diameter with NASCET-based stenosis levels, using the diameter of the distal internal carotid lumen as the denominator for stenosis measurement. The following velocity measurements were obtained: RIGHT ICA: 100/28 cm/sec CCA: 59/16 cm/sec SYSTOLIC ICA/CCA RATIO:  2.2 ECA: 34 cm/sec LEFT ICA: 83/39 cm/sec CCA: 38/46 cm/sec SYSTOLIC ICA/CCA RATIO:  1.9 ECA: 44 cm/sec RIGHT CAROTID ARTERY: There is a minimal amount of eccentric echogenic plaque within the right carotid bulb (images 5 and 17), not resulting in elevated peak systolic velocities within the interrogated course of the right internal carotid artery to suggest a hemodynamically significant stenosis. RIGHT VERTEBRAL ARTERY:  Antegrade flow LEFT CAROTID ARTERY: There is a minimal amount of eccentric echogenic plaque within the left carotid bulb (image 47) and extending to involve the origin and proximal aspect of the left internal carotid artery (image 53), not resulting in elevated peak systolic velocities within the interrogated course of the left internal carotid artery to suggest a hemodynamically significant stenosis LEFT VERTEBRAL ARTERY:  Antegrade flow IMPRESSION: Minimal amount of bilateral atherosclerotic plaque, left greater than right, not resulting in a hemodynamically significant stenosis within either internal carotid artery. Electronically Signed   By: Sandi Mariscal M.D.   On: 04/29/2021  13:49    Scheduled Meds:  amLODipine  10 mg Oral Daily   aspirin EC  81 mg Oral Q0600   atorvastatin  40 mg Oral QHS   Chlorhexidine Gluconate Cloth  6 each Topical Daily   cholecalciferol  2,000 Units Oral Daily   donepezil  10 mg Oral Daily   feeding supplement (NEPRO CARB STEADY)  237 mL Oral TID BM   irbesartan  150 mg Oral Daily   megestrol  40 mg Oral Daily   mirabegron ER  25 mg Oral Daily   multivitamin with minerals  1 tablet Oral Daily   pantoprazole sodium  40 mg Oral BID   tamsulosin  0.4 mg Oral Daily   Continuous Infusions:     LOS: 6 days   Time spent: 37 minutes. More than 50% of the time was spent in counseling/coordination of care with consultants and family.  Lorella Nimrod, MD [facility]  If 7PM-7AM, please contact night-coverage [email or web address]  04/30/2021, 3:20 PM   This record has been created using Systems analyst. Errors have been sought and corrected,but may not always be located. Such creation errors do not reflect on the standard of care.

## 2021-05-01 DIAGNOSIS — M25559 Pain in unspecified hip: Secondary | ICD-10-CM

## 2021-05-01 LAB — RESP PANEL BY RT-PCR (FLU A&B, COVID) ARPGX2
Influenza A by PCR: NEGATIVE
Influenza B by PCR: NEGATIVE
SARS Coronavirus 2 by RT PCR: NEGATIVE

## 2021-05-01 MED ORDER — FOOD THICKENER (SIMPLYTHICK): 1.0000 | ORAL | Status: DC | PRN

## 2021-05-01 MED ORDER — AMLODIPINE BESYLATE 10 MG PO TABS: 10.0000 mg | ORAL_TABLET | Freq: Every day | ORAL | Status: DC

## 2021-05-01 MED ORDER — NEPRO/CARBSTEADY PO LIQD: 237.0000 mL | Freq: Three times a day (TID) | ORAL | 0 refills | Status: DC

## 2021-05-01 MED ORDER — ACETAMINOPHEN 325 MG PO TABS: 650.0000 mg | ORAL_TABLET | Freq: Four times a day (QID) | ORAL | Status: DC | PRN

## 2021-05-01 MED ORDER — ADULT MULTIVITAMIN W/MINERALS CH: 1.0000 | ORAL_TABLET | Freq: Every day | ORAL | Status: DC

## 2021-05-01 NOTE — Discharge Summary (Addendum)
Physician Discharge Summary  James Dickerson IOE:[id number] DOB: 07/12/1940 DOA: 04/24/2021  PCP: Pcp, No  Admit date: 04/24/2021 Discharge date: 05/01/2021  Admitted From: Group home Disposition: SNF  Recommendations for Outpatient Follow-up:  Follow up with PCP in 1-2 weeks Follow-up with cardiothoracic surgery at [facility] in 1 to 2 weeks Follow-up with orthopedic surgery in 1 to 2 weeks Please obtain BMP/CBC in one week Please follow up on the following pending results: None  Home Health: No Equipment/Devices: Rolling walker Discharge Condition: Stable CODE STATUS: DNR Diet recommendation: Heart Healthy /dysphagia-nectar thick  Brief/Interim Summary: James Dickerson is a 80 y.o. African-American male with medical history significant for COPD, depression, GERD, dyslipidemia and hypertension, who presented to the ER with acute onset of altered mental status with recent recurrent falls.  He had a subsequent contusion to his right cheek.  Patient was recently moved to a different group home on Wednesday.   On presentation to ED patient had markedly elevated blood pressure, tachycardic mom, mildly tachypneic, labs only pertinent for mild hypokalemia.  COVID-19 and influenza PCR negative.  UA concerning for UTI.  Multiple imaging for concern of falls and injuries shows multiple fractures which include right fifth and sixth rib, T12, and right proximal fibula.  CT head and neck was without any acute abnormality.  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  A small amount of arterial contrast extended into the presumed hematoma, concern for leaking aneurysm.  Vascular surgery was consulted and they are recommending conservative management with blood pressure control.  They also recommend transfer to [facility] for a possible evaluation and management by  cardiothoracic surgery.  We contacted Dr. Mali Hughes's team at [facility], they declined hospital to hospital transfer stating that there is no acute need to perform the surgery, they were recommending outpatient follow-up with Dr. Mali Hughes after discharge.  Patient needs to follow-up with cardiothoracic surgery for further management as he is very high risk for aneurysmal rupture which can be life-threatening.  After talking with his daughter, CODE STATUS changed to DNR with full scope of medical care as performing CPR on him might be more damaging.  It was noticed that patient does not have a whole lot of family support.  1 cousin was willing to transport for his outpatient appointments.  Rehab should be able to provide transportation.  Because of his multiple fractures including right fibular fracture which was minimally displaced, orthopedic surgery was consulted but they are recommending conservative management.  Patient can be weightbearing as tolerated.  He needs to follow-up with orthopedic surgery for further recommendations.  Patient was found to have aphonia/dysphonia on presentation.  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  Patient did met sepsis criteria on presentation with tachycardia, tachypnea, altered mental status, most likely secondary to UTI.  Blood cultures remain negative.  Urine cultures with pansensitive E. coli.  Patient initially received ceftriaxone followed by Keflex to complete the course.  Patient has an history of hypertension, came with hypertensive urgency and because of his risk of leaking thoracic arch aneurysm he was placed on IV esmolol.  His home dose of amlodipine was increased to 10 mg daily with resultant improvement in his blood pressure and he will continue with rest of his  home medications.  His goal blood pressure is less than 371 systolic.  Patient was also  found to be severely protein caloric malnourished, with severe muscle wasting and poor p.o. intake.  Patient needs assistance with feeding and able to tolerate diet well.  We also obtained swallow evaluation and our swallow team recommend nectar thick although there was no significant abnormality noted on barium swallow.  Patient will get benefit with continuation of supplement and assistance with feeding.  Patient was also found to have multiple electrolyte abnormalities during the course of her illness which were repleted before discharge.  Our physical therapist evaluated him and they recommend going to rehab before returning to his prior living atmosphere.  He is being discharged to rehab for further management.  Patient will continue with current medications and follow-up with his providers as mentioned above.  Discharge Diagnoses:  Principal Problem:   Altered mental status Active Problems:   Thoracic aortic aneurysm (TAA)   Lower urinary tract infectious disease   Contusion   Hip pain   Discharge Instructions  Discharge Instructions     Diet - low sodium heart healthy   Complete by: As directed    Increase activity slowly   Complete by: As directed       Allergies as of 05/01/2021   No Known Allergies      Medication List     TAKE these medications    acetaminophen 325 MG tablet Commonly known as: TYLENOL Take 2 tablets (650 mg total) by mouth every 6 (six) hours as needed for mild pain (or Fever >/= 101).   albuterol (2.5 MG/3ML) 0.083% nebulizer solution Commonly known as: PROVENTIL Take 2.5 mg by nebulization every 6 (six) hours as needed.   amLODipine 10 MG tablet Commonly known as: NORVASC Take 1 tablet (10 mg total) by mouth daily. Start taking on: May 02, 2021 What changed:  medication strength how much to take when to take this   aspirin 81 MG EC tablet Take 1 tablet (81 mg total) by mouth daily at 6 (six) AM. Swallow whole.   atorvastatin  40 MG tablet Commonly known as: LIPITOR Take 1 tablet (40 mg total) by mouth at bedtime.   budesonide-formoterol 160-4.5 MCG/ACT inhaler Commonly known as: SYMBICORT Inhale 2 puffs into the lungs 2 (two) times daily.   donepezil 10 MG tablet Commonly known as: ARICEPT Take 10 mg by mouth daily.   feeding supplement (NEPRO CARB STEADY) Liqd Take 237 mLs by mouth 3 (three) times daily between meals.   food thickener Gel Commonly known as: SIMPLYTHICK (NECTAR/LEVEL 2/MILDLY THICK) Take 1 packet by mouth as needed.   ipratropium-albuterol 0.5-2.5 (3) MG/3ML Soln Commonly known as: DUONEB Inhale 3 mLs into the lungs every 6 (six) hours as needed (Wheezing).   megestrol 40 MG tablet Commonly known as: MEGACE Take 40 mg by mouth daily.   mirabegron ER 25 MG Tb24 tablet Commonly known as: MYRBETRIQ Take 1 tablet (25 mg total) by mouth daily.   multivitamin with minerals Tabs tablet Take 1 tablet by mouth daily. Start taking on: May 02, 2021   OXYGEN Inhale 2-3 L into the lungs as needed (at bedtime).   pantoprazole 40 MG tablet Commonly known as: PROTONIX Take 40 mg by mouth 2 (two) times daily.   tamsulosin 0.4 MG Caps capsule Commonly known as: FLOMAX Take 0.4 mg by mouth daily.   valsartan 160 MG tablet Commonly known as: DIOVAN Take 160 mg by mouth daily.   Vitamin  D3 50 MCG (2000 UT) Chew Chew 2,000 Units by mouth daily.        Follow-up Information     Anson Crofts, MD. Schedule an appointment as soon as possible for a visit in 1 week(s).   Specialty: Cardiothoracic Surgery Contact information: [address] [place] [address] [phone]         Thornton Park, MD. Schedule an appointment as soon as possible for a visit in 2 week(s).   Specialty: Orthopedic Surgery Contact information: [place] [address] [phone]                No Known Allergies  Consultations: Vascular  surgery Orthopedic surgery  Procedures/Studies: DG Chest 2 View  Result Date: 04/24/2021 CLINICAL DATA:  Multiple falls.  Right rib pain. EXAM: CHEST - 2 VIEW COMPARISON:  07/22/2020 FINDINGS: Pacer with leads at right atrium and right ventricle. No lead discontinuity. Remote right rib fractures. Bilateral remote rib fractures. No acute displaced fracture identified. Midline trachea. Normal heart size. Transverse aortic dilatation was detailed on the CTA of 11/08/2020. Question increase in soft tissue fullness since prior plain film of 07/22/2020. No pleural effusion or pneumothorax. Scarring in the medial left lower lobe. IMPRESSION: No acute posttraumatic deformity identified. Known thoracic aortic aneurysm, as on 11/07/2020 CT. Question increased soft tissue fullness in this region since prior plain film of 07/22/2020. If suspicion of acute complication, consider repeat CTA. Electronically Signed   By: Abigail Miyamoto M.D.   On: 04/24/2021 14:55   DG Tibia/Fibula Right  Result Date: 04/25/2021 CLINICAL DATA:  Altered mental status and recurrent falls. EXAM: RIGHT TIBIA AND FIBULA - 2 VIEW COMPARISON:  None. FINDINGS: An acute versus subacute, very mildly displaced fracture deformity is seen involving the shaft of the proximal right fibula. There is no evidence of dislocation. Soft tissues are unremarkable. IMPRESSION: Acute versus subacute fracture of the proximal right fibula. Electronically Signed   By: Virgina Norfolk M.D.   On: 04/25/2021 00:11   CT HEAD WO CONTRAST  Result Date: 04/24/2021 CLINICAL DATA:  Head trauma EXAM: CT HEAD WITHOUT CONTRAST TECHNIQUE: Contiguous axial images were obtained from the base of the skull through the vertex without intravenous contrast. COMPARISON:  CT head 04/04/2021 FINDINGS: Brain: No acute intracranial hemorrhage, mass effect, or herniation. No extra-axial fluid collections. No evidence of acute territorial infarct. No hydrocephalus. Extensive hypodensities  throughout the periventricular and subcortical white matter, likely secondary to advanced chronic microvascular ischemic changes. 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 Vascular: No hyperdense vessel or unexpected calcification. Skull: Normal. Negative for fracture or focal lesion. Sinuses/Orbits: Mild mucosal thickening in the paranasal sinuses and mild opacification of the mastoid air cells. Other: None. IMPRESSION: No acute intracranial process identified. Multiple chronic findings as described. Electronically Signed   By: Ofilia Neas M.D.   On: 04/24/2021 14:49   CT Head Wo Contrast  Result Date: 04/04/2021 CLINICAL DATA:  Fall yesterday with facial abrasions. EXAM: CT HEAD WITHOUT CONTRAST TECHNIQUE: Contiguous axial images were obtained from the base of the skull through the vertex without intravenous contrast. COMPARISON:  Head CT 01/01/2020 FINDINGS: Brain: Stable degree of generalized atrophy and chronic small vessel ischemia. No intracranial hemorrhage, mass effect, or midline shift. No hydrocephalus. The basilar cisterns are patent. 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 Remote lacunar infarct in the left thalamus. No evidence of territorial infarct or acute ischemia. No extra-axial or  intracranial fluid collection. Vascular: Atherosclerosis of skullbase vasculature without hyperdense vessel or abnormal calcification. Skull: No fracture or focal lesion. Sinuses/Orbits: Partial opacification of bilateral mastoid air cells, not seen on prior exam. No visualized temporal bone fracture. Orbits and paranasal sinuses are assessed on concurrent face CT, reported separately. Other: No confluent scalp contusion. IMPRESSION: 1. No acute intracranial abnormality. No skull fracture. 2. Stable atrophy, chronic small vessel ischemia, and remote infarcts. 3. Partial opacification of bilateral mastoid air cells. Electronically Signed   By: Keith Rake M.D.   On:  04/04/2021 22:45   CT Cervical Spine Wo Contrast  Result Date: 04/24/2021 CLINICAL DATA:  Trauma EXAM: CT CERVICAL SPINE WITHOUT CONTRAST TECHNIQUE: Multidetector CT imaging of the cervical spine was performed without intravenous contrast. Multiplanar CT image reconstructions were also generated. COMPARISON:  None. FINDINGS: Alignment: Grade 1 anterolisthesis of C2 on C3. Skull base and vertebrae: No acute fracture. No primary bone lesion or focal pathologic process. Soft tissues and spinal canal: No prevertebral fluid or swelling. No visible canal hematoma. Disc levels: Moderate to severe intervertebral disc height loss at multiple levels from C3 through T1. Associated endplate sclerosis, cysts, uncovertebral spurring and small dorsal endplate osteophytes. Mild facet arthropathy bilaterally. Multilevel neural foraminal narrowing most significant at C5-C6 bilaterally. Likely multilevel mild-to-moderate spinal canal stenoses. Upper chest: Emphysematous changes in the visualized upper lungs. Other: None. IMPRESSION: No acute fracture or subluxation identified. Advanced degenerative changes. Electronically Signed   By: Ofilia Neas M.D.   On: 04/24/2021 14:52   CT Cervical Spine Wo Contrast  Result Date: 04/04/2021 CLINICAL DATA:  Head trauma.  Fall yesterday.  Facial abrasions. EXAM: CT CERVICAL SPINE WITHOUT CONTRAST TECHNIQUE: Multidetector CT imaging of the cervical spine was performed without intravenous contrast. Multiplanar CT image reconstructions were also generated. COMPARISON:  None. FINDINGS: Alignment: Broad-based reversal of normal lordosis. No traumatic subluxation. Trace anterolisthesis of C2 on C3. Skull base and vertebrae: No acute fracture. Vertebral body heights are maintained. The dens and skull base are intact. Soft tissues and spinal canal: No prevertebral fluid or swelling. No visible canal hematoma. Disc levels: Multilevel degenerative disc disease with disc space narrowing and  endplate spurring. There is multilevel facet hypertrophy. No high-grade bony canal stenosis. Upper chest: Emphysema.  No acute findings. Other: None. IMPRESSION: Multilevel degenerative change in the cervical spine without acute fracture or subluxation. Electronically Signed   By: Keith Rake M.D.   On: 04/04/2021 22:50   US Carotid Bilateral  Result Date: 04/29/2021 CLINICAL DATA:  Altered mental status. History of hypertension, hyperlipidemia and smoking. EXAM: BILATERAL CAROTID DUPLEX ULTRASOUND TECHNIQUE: Pearline Cables scale imaging, color Doppler and duplex ultrasound were performed of bilateral carotid and vertebral arteries in the neck. COMPARISON:  None. FINDINGS: Criteria: Quantification of carotid stenosis is based on velocity parameters that correlate the residual internal carotid diameter with NASCET-based stenosis levels, using the diameter of the distal internal carotid lumen as the denominator for stenosis measurement. The following velocity measurements were obtained: RIGHT ICA: 100/28 cm/sec CCA: 23/76 cm/sec SYSTOLIC ICA/CCA RATIO:  2.2 ECA: 34 cm/sec LEFT ICA: 83/39 cm/sec CCA: 28/31 cm/sec SYSTOLIC ICA/CCA RATIO:  1.9 ECA: 44 cm/sec RIGHT CAROTID ARTERY: There is a minimal amount of eccentric echogenic plaque within the right carotid bulb (images 5 and 17), not resulting in elevated peak systolic velocities within the interrogated course of the right internal carotid artery to suggest a hemodynamically significant stenosis. RIGHT VERTEBRAL ARTERY:  Antegrade flow LEFT CAROTID ARTERY: There is a minimal amount  of eccentric echogenic plaque within the left carotid bulb (image 47) and extending to involve the origin and proximal aspect of the left internal carotid artery (image 53), not resulting in elevated peak systolic velocities within the interrogated course of the left internal carotid artery to suggest a hemodynamically significant stenosis LEFT VERTEBRAL ARTERY:  Antegrade flow IMPRESSION:  Minimal amount of bilateral atherosclerotic plaque, left greater than right, not resulting in a hemodynamically significant stenosis within either internal carotid artery. Electronically Signed   By: Sandi Mariscal M.D.   On: 04/29/2021 13:49   DG Swallowing Func-Speech Pathology  Result Date: 04/28/2021 Table formatting from the original result was not included. Objective Swallowing Evaluation: Type of Study: MBS-Modified Barium Swallow Study  Patient Details Name: Keymarion Bearman MRN: [id number] Date of Birth: 11-04-1940 Today's Date: 04/28/2021 Time: SLP Start Time (ACUTE ONLY): 0820 -SLP Stop Time (ACUTE ONLY): 0900 SLP Time Calculation (min) (ACUTE ONLY): 40 min Past Medical History: Past Medical History: Diagnosis Date  Back pain   Cardiomegaly   COPD (chronic obstructive pulmonary disease) ([place])   Cystic kidney disease   Depressive disorder   GERD (gastroesophageal reflux disease)   High cholesterol   History of prostate cancer   Hypertension   Prostate cancer ([place]) 2018  Rad seeds  Thyroid nodule  Past Surgical History: Past Surgical History: Procedure Laterality Date  ABDOMINAL AORTIC ENDOVASCULAR STENT GRAFT N/A 06/22/2020  Procedure: ABDOMINAL AORTIC ENDOVASCULAR STENT GRAFT;  Surgeon: Cherre Robins, MD;  Location: [place];  Service: Vascular;  Laterality: N/A;  [place] N/A 06/22/2020  Procedure: CYSTOSCOPY WITH URETHRAL DILATATION;  Surgeon: Alexis Frock, MD;  Location: [place];  Service: Urology;  Laterality: N/A;  INSERTION OF ILIAC STENT Right 06/22/2020  Procedure: INSERTION OF RIGHT EXTERNAL ILIAC ARTERY STENT;  Surgeon: Cherre Robins, MD;  Location: [facility];  Service: Vascular;  Laterality: Right;  INSERTION OF SUPRAPUBIC CATHETER N/A 06/22/2020  Procedure: INSERTION OF SUPRAPUBIC CATHETER;  Surgeon: Alexis Frock, MD;  Location: [facility];  Service: Urology;  Laterality: N/A;  PACEMAKER INSERTION N/A 08/06/2016  Procedure: INSERTION PACEMAKER;  Surgeon:  Isaias Cowman, MD;  Location: [facility];  Service: Cardiovascular;  Laterality: N/A; HPI: Per admitting H&P'James Dickerson is a 80 y.o. African-American male with medical history significant for COPD, depression, GERD, dyslipidemia and hypertension, who presented to the ER with acute onset of altered mental status with recent recurrent falls.  He had a subsequent contusion to his right cheek.  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  Most recent chest CT (04/24/2021) revealed an increase in the degree of enhancement in the saccular aneurysm consistent with some worsening mural hematoma. This area now measures 5.9 x 3.6 cm (previously measuring 5.3 x 3.2 cm) the more distal descending thoracic aorta shows some admixture abnormalities although no true dissection flap is seen.  Subjective: pt pleasant, alert, aphonic  Recommendations for follow up therapy are one component of a multi-disciplinary discharge planning process, led by the attending physician.  Recommendations may be updated based on patient status, additional functional criteria and insurance authorization. Assessment / Plan / Recommendation Clinical Impressions 04/28/2021 Clinical Impression Pt demonstrates moderate oral phase dysphagia. Pt consumed 2 trials of nectar thick  liquids via spoon, 1 trial of nectar thick liquid via cup and 1 trial of thin liquids via spoon. His oral phase is c/b weak lingual manipulation of boluses resulting in decreased posterior propulsion of each bolus, premature spillage and piecemeal swallowing. Pt's pharyngeal phase is c/b swallow initiation at the level of the vallecula with no evidence of penetration or aspiration. After  consuming the above 4 trials, pt became SOB, RR up to 34, air hungry, unable to control breathing despite heavy cues for pursed lip breaths, O2 dropping into the 70's with difficulty recovering. Rapid response arrived and placed pt on supplemental O2 with sats recovering. Recommend further trials of thin liquids at bedside under close monitoring of ICU. SLP Visit Diagnosis Dysphagia, oral phase (R13.11) Attention and concentration deficit following -- Frontal lobe and executive function deficit following -- Impact on safety and function Mild aspiration risk   Treatment Recommendations 04/28/2021 Treatment Recommendations Therapy as outlined in treatment plan below   Prognosis 04/28/2021 Prognosis for Safe Diet Advancement Fair Barriers to Reach Goals Cognitive deficits Barriers/Prognosis Comment -- Diet Recommendations 04/28/2021 SLP Diet Recommendations Dysphagia 1 (Puree) solids;Nectar thick liquid Liquid Administration via Cup Medication Administration Crushed with puree Compensations Minimize environmental distractions;Slow rate;Small sips/bites Postural Changes Seated upright at 90 degrees   Other Recommendations 04/28/2021 Recommended Consults -- Oral Care Recommendations Oral care BID Other Recommendations Order thickener from pharmacy;Prohibited food (jello, ice cream, thin soups);Remove water pitcher Follow Up Recommendations Skilled nursing-short term rehab (<3 hours/day) Assistance recommended at discharge Frequent or constant Supervision/Assistance Functional Status Assessment Patient has had a recent decline in their functional status and demonstrates the ability to make significant improvements in function in a reasonable and predictable amount of time. Frequency and Duration  04/28/2021 Speech Therapy Frequency (ACUTE ONLY) min 3x week Treatment Duration 2 weeks   Oral Phase 04/28/2021 Oral Phase Impaired Oral - Pudding Teaspoon -- Oral - Pudding Cup -- Oral - Honey Teaspoon -- Oral - Honey Cup -- Oral -  Nectar Teaspoon Weak lingual manipulation;Reduced posterior propulsion;Piecemeal swallowing;Decreased bolus cohesion;Delayed oral transit;Premature spillage;Lingual/palatal residue Oral - Nectar Cup Weak lingual manipulation;Reduced posterior propulsion;Piecemeal swallowing;Lingual/palatal residue;Delayed oral transit;Decreased bolus cohesion;Premature spillage;Incomplete tongue to palate contact Oral - Nectar Straw -- Oral - Thin Teaspoon Weak lingual manipulation;Incomplete tongue to palate contact;Reduced posterior propulsion;Lingual/palatal residue;Piecemeal swallowing;Delayed oral transit;Decreased bolus cohesion;Premature spillage Oral - Thin Cup -- Oral - Thin Straw -- Oral - Puree -- Oral - Mech Soft -- Oral - Regular -- Oral - Multi-Consistency -- Oral - Pill -- Oral Phase - Comment --  Pharyngeal Phase 04/28/2021 Pharyngeal Phase Impaired Pharyngeal- Pudding Teaspoon -- Pharyngeal -- Pharyngeal- Pudding Cup -- Pharyngeal -- Pharyngeal- Honey Teaspoon -- Pharyngeal -- Pharyngeal- Honey Cup -- Pharyngeal -- Pharyngeal- Nectar Teaspoon Delayed swallow initiation-vallecula Pharyngeal -- Pharyngeal- Nectar Cup Delayed swallow initiation-vallecula Pharyngeal -- Pharyngeal- Nectar Straw -- Pharyngeal -- Pharyngeal- Thin Teaspoon Delayed swallow initiation-vallecula Pharyngeal -- Pharyngeal- Thin Cup -- Pharyngeal -- Pharyngeal- Thin Straw -- Pharyngeal -- Pharyngeal- Puree -- Pharyngeal -- Pharyngeal- Mechanical Soft -- Pharyngeal -- Pharyngeal- Regular -- Pharyngeal -- Pharyngeal- Multi-consistency -- Pharyngeal -- Pharyngeal- Pill -- Pharyngeal -- Pharyngeal Comment --  Cervical Esophageal Phase  04/28/2021 Cervical Esophageal Phase WFL Pudding Teaspoon -- Pudding Cup -- Honey Teaspoon -- Honey Cup -- Nectar Teaspoon -- Nectar Cup -- Nectar Straw -- Thin Teaspoon -- Thin Cup -- Thin Straw -- Puree -- Mechanical Soft -- Regular -- Multi-consistency -- Pill -- Cervical Esophageal Comment -- Happi B. Rutherford Nail, M.S.,  CCC-SLP,  [facility] Speech-Language Pathologist Rehabilitation Services Office [phone] Stormy Fabian 04/28/2021, 9:44 AM                     CT Angio Chest/Abd/Pel for Dissection W and/or Wo Contrast  Result Date: 04/24/2021 CLINICAL DATA:  Known worsening thoracic aortic aneurysm EXAM: CT ANGIOGRAPHY CHEST, ABDOMEN AND PELVIS TECHNIQUE: Non-contrast CT of the chest was initially obtained. Multidetector CT imaging through the chest, abdomen and pelvis was performed using the standard protocol during bolus administration of intravenous contrast. Multiplanar reconstructed images and MIPs were obtained and reviewed to evaluate the vascular anatomy. CONTRAST:  18m OMNIPAQUE IOHEXOL 350 MG/ML SOLN COMPARISON:  Similar study from 5 hours previous. FINDINGS: CTA CHEST FINDINGS Cardiovascular: No significant cardiac enlargement is noted. Pacing device is again noted and stable. Pulmonary artery as visualized is within normal limits. The ascending thoracic aorta again appears within normal limits. Some turbulence in the descending thoracic aorta shows poor admixing of contrast and blood. No new dissection flap is seen. 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 There has however been increase in the degree of enhancement in the saccular aneurysm consistent with some worsening mural hematoma. This area now measures 5.9 x 3.6 cm (previously measuring 5.3 x 3.2 cm) the more distal descending thoracic aorta shows some admixture abnormalities although no true dissection flap is seen. Mediastinum/Nodes: Thoracic inlet is within normal limits. Esophagus is unremarkable. No sizable hilar or mediastinal adenopathy is noted. Lungs/Pleura: Emphysematous changes are seen. Stable scarring in the lung bases is noted. No new focal abnormality is identified from the earlier CT. Musculoskeletal: Old healed rib fractures are noted on the left. Stable  fractures on the right are noted involving the fifth and sixth ribs anterior laterally. T12 compression fracture is noted stable in appearance from the recent exam. Review of the MIP images confirms the above findings. CTA ABDOMEN AND PELVIS FINDINGS VASCULAR Aorta: Aortic stent graft is noted in place. Patent flow through the stent graft is identified into the external iliac artery on the left and distal common iliac artery on the right. The excluded aneurysm sac is stable in appearance when compared with the prior study. No new endoleak is identified. Celiac: Celiac axis is widely patent. SMA: SMA is stable in appearance. No evidence of thrombosis or embolism is seen. Renals: Single renal arteries are identified bilaterally. No focal stenosis is seen. IMA: Occluded at its origin secondary to the stent graft. Reconstitution of distal branches is noted. Inflow: Iliacs are patent via a runoff from the stent graft. Stenting in the right external iliac artery is seen. Common femoral artery is within normal limits bilaterally. Right internal iliac artery is patent. The left internal iliac artery is occluded at its origin also stable from the prior study. Veins: No specific venous abnormality is noted. Review of the MIP images confirms the above findings. NON-VASCULAR Hepatobiliary: No focal liver abnormality is seen. No gallstones, gallbladder wall thickening, or biliary dilatation. Pancreas: Unremarkable. No pancreatic ductal dilatation or surrounding inflammatory changes. Spleen: Normal in size without focal abnormality. Adrenals/Urinary Tract: Adrenal glands are within normal limits. Kidneys are within normal limits with the exception of a large left renal cyst stable from the prior exam. Scattered vascular calcifications are seen. No definitive renal stones are seen. Ureters appear within normal limits. The bladder is partially distended with contrast from prior CT. Stomach/Bowel: No obstructive or inflammatory  changes  of the bowel are seen. Lymphatic: No sizable lymphadenopathy is noted. Reproductive: Prostate is unremarkable. Other: No abdominal wall hernia or abnormality. No abdominopelvic ascites. Musculoskeletal: No acute or significant osseous findings. Review of the MIP images confirms the above findings. 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 Some slight increase in the degree of opacification of aneurysm is seen consistent with slight enlarging mural aneurysm. No new dissection is seen. Right fifth and sixth rib fractures stable from recent exam. T12 compression deformity also stable from the recent exam. CTA of the abdomen and pelvis: Known aortic stent graft which is widely patent. No occlusion is noted. No new vascular abnormality to correspond with the patient's given clinical history is seen. No new visceral abnormality is identified to correspond with the increase in abdominal pain. Electronically Signed   By: Inez Catalina M.D.   On: 04/24/2021 23:00   CT Angio Chest/Abd/Pel for Dissection W and/or Wo Contrast  Result Date: 04/24/2021 CLINICAL DATA:  Falls. Chest radiograph suggests increased soft tissue in the region of the known thoracic aortic aneurysm. EXAM: CT ANGIOGRAPHY CHEST, ABDOMEN AND PELVIS TECHNIQUE: Non-contrast CT of the chest was initially obtained. Multidetector CT imaging through the chest, abdomen and pelvis was performed using the standard protocol during bolus administration of intravenous contrast. Multiplanar reconstructed images and MIPs were obtained and reviewed to evaluate the vascular anatomy. CONTRAST:  51m OMNIPAQUE IOHEXOL 350 MG/ML SOLN COMPARISON:  Current chest radiograph. Chest, abdomen and pelvis CTA, 11/07/2020. FINDINGS: CTA CHEST FINDINGS Cardiovascular: As suggested on the current chest radiograph, there has been a change in the appearance of the aneurysm of the aortic  arch. At the level of the focal bulge of the aortic arch, just distal to the left subclavian artery origin, there is now increased soft tissue attenuation that is contiguous with the wall, average Hounsfield units 22. Also, there is contrast that extends into this soft tissue at the level of the focal aortic bulge, consistent with arterial blood dissecting into the mural thrombus/mural hematoma. This soft tissue measures 1.8 cm in thickness along the lateral wall the distal arch, measuring only a few mm on the prior CT, and measures approximally 5.4 x 3.3 cm inferior to the focal bulge, where it measured approximally 3.6 x 2.7 cm previously. This is suspected to be mural hemorrhage has increased from prior exam. The ascending thoracic aorta measures 3.2 cm in diameter. At the level of the aneurysm of the distal arch the lumen now measures 3.8 cm, 3.6 cm previously. At the level of the inferior focal bulge of the distal arch, on the sagittal reconstructed images, the lumen measures 4 cm, previously 3.9 cm. Overall thoracic aorta at the level of the saccular aneurysm, including the wall and hematoma now measures 5.5 cm on the sagittal reconstructed image. The descending thoracic aorta, from wall to wall, measures 4.4 cm, above the level of the hiatus, without significant change. No aortic dissection. Heart is normal in size and configuration.  No pericardial effusion. Mediastinum/Nodes: No neck base, mediastinal or hilar masses or enlarged lymph nodes. Trachea and esophagus are unremarkable. Lungs/Pleura: Mild linear atelectasis and/or scarring at the lung bases. Centrilobular emphysema. No evidence of pneumonia or pulmonary edema. No pleural effusion or pneumothorax. No significant change. Musculoskeletal: Mild compression fracture of T12, new since the prior CT. There is slight posterior contour bulging of the vertebra, but no retropulsed fragment or significant encroachment upon the  spinal. There are recent  fractures of the lateral right fifth and sixth ribs. There may be additional fractures, with this assessment limited by motion. No other fractures.  No bone lesions. Review of the MIP images confirms the above findings. CTA ABDOMEN AND PELVIS FINDINGS VASCULAR Aorta: Previous exclusion of an abdominal aortic aneurysm with a stent graft, unchanged compared to the prior CT native aneurysm measuring 4.1 cm anterior-posterior by 5 cm transversely. Aortic lumen remains widely patent. Celiac: Patent without evidence of aneurysm, dissection, vasculitis or significant stenosis. SMA: Patent without evidence of aneurysm, dissection, vasculitis or significant stenosis. Renals: Images are degraded by patient motion. No convincing significant renal artery stenosis or change from the prior CTA. IMA: Excluded at its origin with collateral flow again noted, unchanged from the prior CTA. Inflow: Iliac artery stent grafts are widely patent. Veins: No obvious venous abnormality within the limitations of this arterial phase study. Review of the MIP images confirms the above findings. NON-VASCULAR Hepatobiliary: No focal liver abnormality is seen. No gallstones, gallbladder wall thickening, or biliary dilatation. Pancreas: Unremarkable. No pancreatic ductal dilatation or surrounding inflammatory changes. Spleen: Normal in size without focal abnormality. Adrenals/Urinary Tract: No adrenal masses. Left renal cyst. Small nonobstructing stone in the lower pole the right kidney suggested. No hydronephrosis. Ureters not well visualized, but no evidence of ureteral dilation or stone. Bladder is unremarkable. Stomach/Bowel: Bowel assessment somewhat limited by motion. Stomach is unremarkable. Small bowel and colon are normal in caliber. No wall thickening convincing inflammation. Lymphatic: No enlarged lymph nodes. Reproductive: Unremarkable. Other: No abdominal wall hernia.  No ascites. Musculoskeletal: No fracture or bone lesion. Review of  the MIP images confirms the above findings. IMPRESSION: CTA FINDINGS 1. 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 This increased soft tissue is consistent with an enlarging mural hematoma. A small amount arterial contrast extends into this presumed hematoma, just inferior to the saccular portion of the aneurysm. 2. Other findings of the thoracoabdominal aorta, including the excluded infrarenal abdominal aortic aneurysm, are without significant change from the prior CTA. NON CTA FINDINGS 1. Recent, mild compression fracture of T12, not present on the prior CTA. Patient's chest radiographic history reports multiple falls and right rib pain. T12 fracture is therefore suspected to be acute. 2. Recent/acute fractures of the right lateral fifth and sixth ribs. There may be other rib fractures, but this assessment is limited by respiratory motion. 3. No other nonvascular acute abnormalities within the chest, abdomen or pelvis. Electronically Signed   By: Lajean Manes M.D.   On: 04/24/2021 17:54   DG HIP UNILAT WITH PELVIS 2-3 VIEWS LEFT  Result Date: 04/24/2021 CLINICAL DATA:  Fall, left hip pain EXAM: DG HIP (WITH OR WITHOUT PELVIS) 2-3V LEFT COMPARISON:  CT abdomen/pelvis 07/23/2020 FINDINGS: There is no acute fracture or dislocation. Femoroacetabular alignment is maintained. The SI joints and symphysis pubis are intact. Vascular stents are noted. IMPRESSION: No acute fracture or dislocation. Electronically Signed   By: Valetta Mole M.D.   On: 04/24/2021 14:54   CT Maxillofacial Wo Contrast  Result Date: 04/24/2021 CLINICAL DATA:  Facial trauma EXAM: CT MAXILLOFACIAL WITHOUT CONTRAST TECHNIQUE: Multidetector CT imaging of the maxillofacial structures was performed. Multiplanar CT image reconstructions were also generated. COMPARISON:  CT maxillofacial 04/04/2021 FINDINGS: Osseous: No acute fracture or dislocation identified, given  limitations of motion. No significant change since previous study visualized. Orbits: No acute process. Sinuses: No air-fluid levels. Mild  chronic appearing mucosal thickening/mucous retention cysts in the ethmoid and left maxillary sinus. Soft tissues: Right maxillary soft tissue swelling/hematoma. Limited intracranial: No acute abnormality visualized. IMPRESSION: 1. No acute fracture identified. 2. Right infraorbital maxillary soft tissue swelling/hematoma. Electronically Signed   By: Ofilia Neas M.D.   On: 04/24/2021 14:56   CT Maxillofacial Wo Contrast  Result Date: 04/04/2021 CLINICAL DATA:  Facial trauma.  Fall yesterday.  Facial abrasions. EXAM: CT MAXILLOFACIAL WITHOUT CONTRAST TECHNIQUE: Multidetector CT imaging of the maxillofacial structures was performed. Multiplanar CT image reconstructions were also generated. COMPARISON:  None. FINDINGS: Osseous: No acute fracture of the nasal bone, zygomatic arches, or mandibles. Temporomandibular joints are congruent. Edentulous of upper teeth. Dental caries of included lower teeth. Minimal rightward nasal septal bowing. Orbits: No acute orbital fracture.  No globe injury Sinuses: No sinus fracture or fluid level. There is mucosal thickening of the ethmoid air cells, sphenoid sinuses, and maxillary sinus, with the left maxillary sinus mucous retention cyst. No hemosinus. Soft tissues: Mild left cheek soft tissue edema. Limited intracranial: Assessed on concurrent head CT, reported separately. IMPRESSION: 1. Mild left cheek soft tissue edema. No facial bone fracture. 2. Mild paranasal sinus mucosal thickening. Electronically Signed   By: Keith Rake M.D.   On: 04/04/2021 22:47    Subjective: Patient was seen and examined today.  Denies any complaints.  Resting comfortably in bed.  No chest pain or shortness of breath.  Discharge Exam: Vitals:   05/01/21 0732 05/01/21 1144  BP: 109/74 100/62  Pulse: 88 84  Resp: 16 16  Temp: 98.4 F (36.9  C) 98.4 F (36.9 C)  SpO2: 97% 97%   Vitals:   04/30/21 1922 05/01/21 0401 05/01/21 0732 05/01/21 1144  BP: 103/64 117/65 109/74 100/62  Pulse: 89 79 88 84  Resp: '17 16 16 16  ' Temp: 98.5 F (36.9 C) 98.5 F (36.9 C) 98.4 F (36.9 C) 98.4 F (36.9 C)  TempSrc:      SpO2: 96% 97% 97% 97%  Weight:      Height:        General: Pt is alert, awake, not in acute distress Cardiovascular: RRR, S1/S2 +, no rubs, no gallops Respiratory: CTA bilaterally, no wheezing, no rhonchi Abdominal: Soft, NT, ND, bowel sounds + Extremities: no edema, no cyanosis   The results of significant diagnostics from this hospitalization (including imaging, microbiology, ancillary and laboratory) are listed below for reference.    Microbiology: Recent Results (from the past 240 hour(s))  Urine Culture     Status: Abnormal   Collection Time: 04/24/21  7:16 PM   Specimen: Urine, Clean Catch  Result Value Ref Range Status   Specimen Description   Final    URINE, CLEAN CATCH Performed at [facility], [address], [place], [place] [address]    Special Requests   Final    NONE Performed at [facility], [place],  [address]    Culture >=100,000 COLONIES/mL ESCHERICHIA COLI (A)  Final   Report Status 04/26/2021 FINAL  Final   Organism ID, Bacteria ESCHERICHIA COLI (A)  Final      Susceptibility   Escherichia coli - MIC*    AMPICILLIN 8 SENSITIVE Sensitive     CEFAZOLIN <=4 SENSITIVE Sensitive     CEFEPIME <=0.12 SENSITIVE Sensitive     CEFTRIAXONE <=0.25 SENSITIVE Sensitive     CIPROFLOXACIN <=0.25 SENSITIVE Sensitive     GENTAMICIN <=1 SENSITIVE Sensitive     IMIPENEM <=0.25  SENSITIVE Sensitive     NITROFURANTOIN <=16 SENSITIVE Sensitive     TRIMETH/SULFA <=20 SENSITIVE Sensitive     AMPICILLIN/SULBACTAM 4 SENSITIVE Sensitive     PIP/TAZO <=4 SENSITIVE Sensitive     * >=100,000 COLONIES/mL ESCHERICHIA COLI  Resp Panel by RT-PCR (Flu A&B, Covid)  Nasopharyngeal Swab     Status: None   Collection Time: 04/24/21  9:12 PM   Specimen: Nasopharyngeal Swab; Nasopharyngeal(NP) swabs in vial transport medium  Result Value Ref Range Status   SARS Coronavirus 2 by RT PCR NEGATIVE NEGATIVE Final    Comment: (NOTE) SARS-CoV-2 target nucleic acids are NOT DETECTED.  The SARS-CoV-2 RNA is generally detectable in upper respiratory specimens during the acute phase of infection. The lowest concentration of SARS-CoV-2 viral copies this assay can detect is 138 copies/mL. A negative result does not preclude SARS-Cov-2 infection and should not be used as the sole basis for treatment or other patient management decisions. A negative result may occur with  improper specimen collection/handling, submission of specimen other than nasopharyngeal swab, presence of viral mutation(s) within the areas targeted by this assay, and inadequate number of viral copies(<138 copies/mL). A negative result must be combined with clinical observations, patient history, and epidemiological information. The expected result is Negative.  Fact Sheet for Patients:  EntrepreneurPulse.com.au  Fact Sheet for Healthcare Providers:  IncredibleEmployment.be  This test is no t yet approved or cleared by the [place] FDA and  has been authorized for detection and/or diagnosis of SARS-CoV-2 by FDA under an Emergency Use Authorization (EUA). This EUA will remain  in effect (meaning this test can be used) for the duration of the COVID-19 declaration under Section 564(b)(1) of the Act, 21 U.S.C.section 360bbb-3(b)(1), unless the authorization is terminated  or revoked sooner.       Influenza A by PCR NEGATIVE NEGATIVE Final   Influenza B by PCR NEGATIVE NEGATIVE Final    Comment: (NOTE) The Xpert Xpress SARS-CoV-2/FLU/RSV plus assay is intended as an aid in the diagnosis of influenza from Nasopharyngeal swab specimens and should not be  used as a sole basis for treatment. Nasal washings and aspirates are unacceptable for Xpert Xpress SARS-CoV-2/FLU/RSV testing.  Fact Sheet for Patients: EntrepreneurPulse.com.au  Fact Sheet for Healthcare Providers: IncredibleEmployment.be  This test is not yet approved or cleared by the [place] FDA and has been authorized for detection and/or diagnosis of SARS-CoV-2 by FDA under an Emergency Use Authorization (EUA). This EUA will remain in effect (meaning this test can be used) for the duration of the COVID-19 declaration under Section 564(b)(1) of the Act, 21 U.S.C. section 360bbb-3(b)(1), unless the authorization is terminated or revoked.  Performed at [facility], [address] [place], [place] [address]   CULTURE, BLOOD (ROUTINE X 2) w Reflex to ID Panel     Status: None   Collection Time: 04/24/21 11:37 PM   Specimen: BLOOD  Result Value Ref Range Status   Specimen Description BLOOD LEFT FOREARM  Final   Special Requests   Final    BOTTLES DRAWN AEROBIC AND ANAEROBIC Blood Culture results may not be optimal due to an inadequate volume of blood received in culture bottles   Culture   Final    NO GROWTH 5 DAYS Performed at [facility], [address] [place], [place] [address]    Report Status 04/29/2021 FINAL  Final  CULTURE, BLOOD (ROUTINE X 2) w Reflex to ID Panel     Status: None   Collection Time:  04/24/21 11:37 PM   Specimen: BLOOD  Result Value Ref Range Status   Specimen Description BLOOD RIGHT ASSIST CONTROL  Final   Special Requests   Final    BOTTLES DRAWN AEROBIC AND ANAEROBIC Blood Culture adequate volume   Culture   Final    NO GROWTH 5 DAYS Performed at [facility], [address], [place], [place] [address]    Report Status 04/29/2021 FINAL  Final  MRSA Next Gen by PCR, Nasal     Status: None   Collection Time: 04/25/21 12:50 AM   Specimen: Nasal Mucosa; Nasal Swab  Result  Value Ref Range Status   MRSA by PCR Next Gen NOT DETECTED NOT DETECTED Final    Comment: (NOTE) The GeneXpert MRSA Assay (FDA approved for NASAL specimens only), is one component of a comprehensive MRSA colonization surveillance program. It is not intended to diagnose MRSA infection nor to guide or monitor treatment for MRSA infections. Test performance is not FDA approved in patients less than 34 years old. Performed at [facility], [address] [place], [place] [address]      Labs: BNP (last 3 results) Recent Labs    07/22/20 2017  BNP 47.3   Basic Metabolic Panel: Recent Labs  Lab 04/24/21 1341 04/25/21 0218  NA 135 136  K 3.2* 3.6  CL 101 104  CO2 26 22  GLUCOSE 117* 113*  BUN 21 23  CREATININE 1.19 1.13  CALCIUM 9.2 9.2   Liver Function Tests: No results for input(s): AST, ALT, ALKPHOS, BILITOT, PROT, ALBUMIN in the last 168 hours. No results for input(s): LIPASE, AMYLASE in the last 168 hours. No results for input(s): AMMONIA in the last 168 hours. CBC: Recent Labs  Lab 04/24/21 1341 04/25/21 0218  WBC 8.7 8.8  HGB 12.5* 12.8*  HCT 38.0* 39.2  MCV 93.6 93.8  PLT 154 145*   Cardiac Enzymes: No results for input(s): CKTOTAL, CKMB, CKMBINDEX, TROPONINI in the last 168 hours. BNP: Invalid input(s): POCBNP CBG: Recent Labs  Lab 04/25/21 0040  GLUCAP 116*   D-Dimer No results for input(s): DDIMER in the last 72 hours. Hgb A1c No results for input(s): HGBA1C in the last 72 hours. Lipid Profile No results for input(s): CHOL, HDL, LDLCALC, TRIG, CHOLHDL, LDLDIRECT in the last 72 hours. Thyroid function studies No results for input(s): TSH, T4TOTAL, T3FREE, THYROIDAB in the last 72 hours.  Invalid input(s): FREET3 Anemia work up No results for input(s): VITAMINB12, FOLATE, FERRITIN, TIBC, IRON, RETICCTPCT in the last 72 hours. Urinalysis    Component Value Date/Time   COLORURINE YELLOW (A) 04/24/2021 1916   APPEARANCEUR CLEAR (A)  04/24/2021 1916   APPEARANCEUR Clear 08/09/2011 1744   LABSPEC >1.046 (H) 04/24/2021 1916   LABSPEC 1.015 08/09/2011 1744   PHURINE 6.0 04/24/2021 1916   GLUCOSEU NEGATIVE 04/24/2021 1916   GLUCOSEU Negative 08/09/2011 1744   HGBUR SMALL (A) 04/24/2021 1916   BILIRUBINUR NEGATIVE 04/24/2021 1916   BILIRUBINUR Negative 08/09/2011 1744   KETONESUR 5 (A) 04/24/2021 1916   PROTEINUR 30 (A) 04/24/2021 1916   NITRITE NEGATIVE 04/24/2021 1916   LEUKOCYTESUR MODERATE (A) 04/24/2021 1916   LEUKOCYTESUR 1+ 08/09/2011 1744   Sepsis Labs Invalid input(s): PROCALCITONIN,  WBC,  LACTICIDVEN Microbiology Recent Results (from the past 240 hour(s))  Urine Culture     Status: Abnormal   Collection Time: 04/24/21  7:16 PM   Specimen: Urine, Clean Catch  Result Value Ref Range Status   Specimen Description  Final    URINE, CLEAN CATCH Performed at [facility], [address] [place], [place] [address]    Special Requests   Final    NONE Performed at [facility], [address] [place], [place] [address]    Culture >=100,000 COLONIES/mL ESCHERICHIA COLI (A)  Final   Report Status 04/26/2021 FINAL  Final   Organism ID, Bacteria ESCHERICHIA COLI (A)  Final      Susceptibility   Escherichia coli - MIC*    AMPICILLIN 8 SENSITIVE Sensitive     CEFAZOLIN <=4 SENSITIVE Sensitive     CEFEPIME <=0.12 SENSITIVE Sensitive     CEFTRIAXONE <=0.25 SENSITIVE Sensitive     CIPROFLOXACIN <=0.25 SENSITIVE Sensitive     GENTAMICIN <=1 SENSITIVE Sensitive     IMIPENEM <=0.25 SENSITIVE Sensitive     NITROFURANTOIN <=16 SENSITIVE Sensitive     TRIMETH/SULFA <=20 SENSITIVE Sensitive     AMPICILLIN/SULBACTAM 4 SENSITIVE Sensitive     PIP/TAZO <=4 SENSITIVE Sensitive     * >=100,000 COLONIES/mL ESCHERICHIA COLI  Resp Panel by RT-PCR (Flu A&B, Covid) Nasopharyngeal Swab     Status: None   Collection Time: 04/24/21  9:12 PM   Specimen: Nasopharyngeal Swab; Nasopharyngeal(NP) swabs in vial  transport medium  Result Value Ref Range Status   SARS Coronavirus 2 by RT PCR NEGATIVE NEGATIVE Final    Comment: (NOTE) SARS-CoV-2 target nucleic acids are NOT DETECTED.  The SARS-CoV-2 RNA is generally detectable in upper respiratory specimens during the acute phase of infection. The lowest concentration of SARS-CoV-2 viral copies this assay can detect is 138 copies/mL. A negative result does not preclude SARS-Cov-2 infection and should not be used as the sole basis for treatment or other patient management decisions. A negative result may occur with  improper specimen collection/handling, submission of specimen other than nasopharyngeal swab, presence of viral mutation(s) within the areas targeted by this assay, and inadequate number of viral copies(<138 copies/mL). A negative result must be combined with clinical observations, patient history, and epidemiological information. The expected result is Negative.  Fact Sheet for Patients:  EntrepreneurPulse.com.au  Fact Sheet for Healthcare Providers:  IncredibleEmployment.be  This test is no t yet approved or cleared by the [place] FDA and  has been authorized for detection and/or diagnosis of SARS-CoV-2 by FDA under an Emergency Use Authorization (EUA). This EUA will remain  in effect (meaning this test can be used) for the duration of the COVID-19 declaration under Section 564(b)(1) of the Act, 21 U.S.C.section 360bbb-3(b)(1), unless the authorization is terminated  or revoked sooner.       Influenza A by PCR NEGATIVE NEGATIVE Final   Influenza B by PCR NEGATIVE NEGATIVE Final    Comment: (NOTE) The Xpert Xpress SARS-CoV-2/FLU/RSV plus assay is intended as an aid in the diagnosis of influenza from Nasopharyngeal swab specimens and should not be used as a sole basis for treatment. Nasal washings and aspirates are unacceptable for Xpert Xpress SARS-CoV-2/FLU/RSV testing.  Fact  Sheet for Patients: EntrepreneurPulse.com.au  Fact Sheet for Healthcare Providers: IncredibleEmployment.be  This test is not yet approved or cleared by the [place] FDA and has been authorized for detection and/or diagnosis of SARS-CoV-2 by FDA under an Emergency Use Authorization (EUA). This EUA will remain in effect (meaning this test can be used) for the duration of the COVID-19 declaration under Section 564(b)(1) of the Act, 21 U.S.C. section 360bbb-3(b)(1), unless the authorization is terminated or revoked.  Performed at [facility], [address] [place], [place] [address]   CULTURE, BLOOD (ROUTINE X 2) w Reflex to ID Panel     Status: None   Collection Time: 04/24/21 11:37 PM   Specimen: BLOOD  Result Value Ref Range Status   Specimen Description BLOOD LEFT FOREARM  Final   Special Requests   Final    BOTTLES DRAWN AEROBIC AND ANAEROBIC Blood Culture results may not be optimal due to an inadequate volume of blood received in culture bottles   Culture   Final    NO GROWTH 5 DAYS Performed at [facility], [address] [place], [place] [address]    Report Status 04/29/2021 FINAL  Final  CULTURE, BLOOD (ROUTINE X 2) w Reflex to ID Panel     Status: None   Collection Time: 04/24/21 11:37 PM   Specimen: BLOOD  Result Value Ref Range Status   Specimen Description BLOOD RIGHT ASSIST CONTROL  Final   Special Requests   Final    BOTTLES DRAWN AEROBIC AND ANAEROBIC Blood Culture adequate volume   Culture   Final    NO GROWTH 5 DAYS Performed at [facility], [address] [place], [place] [address]    Report Status 04/29/2021 FINAL  Final  MRSA Next Gen by PCR, Nasal     Status: None   Collection Time: 04/25/21 12:50 AM   Specimen: Nasal Mucosa; Nasal Swab  Result Value Ref Range Status   MRSA by PCR Next Gen NOT DETECTED NOT DETECTED Final    Comment: (NOTE) The GeneXpert MRSA Assay (FDA  approved for NASAL specimens only), is one component of a comprehensive MRSA colonization surveillance program. It is not intended to diagnose MRSA infection nor to guide or monitor treatment for MRSA infections. Test performance is not FDA approved in patients less than 81 years old. Performed at [facility], [address] [place], [place] [address]     Time coordinating discharge: Over 30 minutes  SIGNED:  Lorella Nimrod, MD  [facility] 05/01/2021, 12:49 PM  If 7PM-7AM, please contact night-coverage [email or web address]  This record has been created using Systems analyst. Errors have been sought and corrected,but may not always be located. Such creation errors do not reflect on the standard of care.

## 2021-05-01 NOTE — TOC Progression Note (Signed)
Transition of Care [facility]) - Progression Note    Patient Details  Name: James Dickerson MRN: [id number] Date of Birth: 05-10-1941  Transition of Care [facility]) CM/SW Contact  Eileen Stanford, LCSW Phone Number: 05/01/2021, 9:58 AM  Clinical Narrative:   Josem Kaufmann pending for [facility] CSW requested new Covid test.    Expected Discharge Plan: Skilled Nursing Facility Barriers to Discharge: Ship broker  Expected Discharge Plan and Services Expected Discharge Plan: [place] In-house Referral: Clinical Social Work   Post Acute Care Choice: [place] Living arrangements for the past 2 months: Group Home                                       Social Determinants of Health (SDOH) Interventions    Readmission Risk Interventions No flowsheet data found.

## 2021-05-01 NOTE — TOC Transition Note (Signed)
Transition of Care [facility]) - CM/SW Discharge Note   Patient Details  Name: James Dickerson MRN: [id number] Date of Birth: [birth year]  [facility] of Care [facility]) CM/SW Contact:  Eileen Stanford, LCSW Phone Number: 05/01/2021, 1:35 PM   Clinical Narrative:  Clinical Social Worker facilitated patient discharge including contacting patient family and facility to confirm patient discharge plans.  Clinical information faxed to facility and family agreeable with plan.  CSW arranged ambulance transport via [facility] to [facility] .  RN to call [phone] for report prior to discharge.     Final next level of care: Skilled Nursing Facility Barriers to Discharge: No Barriers Identified   Patient Goals and CMS Choice Patient states their goals for this hospitalization and ongoing recovery are:: for pt to get better   Choice offered to / list presented to : Adult Children  Discharge Placement              Patient chooses bed at:  [facility]) Patient to be transferred to facility by: [facility] Name of family member notified: Sissy Patient and family notified of of transfer: 05/01/21  Discharge Plan and Services In-house Referral: Clinical Social Work   Post Acute Care Choice: [place]                               Social Determinants of Health (SDOH) Interventions     Readmission Risk Interventions No flowsheet data found.

## 2021-05-01 NOTE — Progress Notes (Signed)
Speech Language Pathology Treatment:    Patient Details Name: James Dickerson MRN: [id number] DOB: [birth year] Today's Date: 05/01/2021 Time: 7209-4709 SLP Time Calculation (min) (ACUTE ONLY): 10 min  Assessment / Plan / Recommendation Clinical Impression  Pt seen for diet tolerance/trials of upgraded textures. Pt awake, alert, and conversant. Aphonic vocal quality persists. Pt on room air.   Noted pt with recent MBSS, 04/28/21, which recommended "further trials of thin liquids at bedside under close monitoring of ICU." Pt has since been transferred to the floor. Per chart review, temp and WBC WNL. No recent chest imaging.  Pt demonstrated an adequate oral swallow with items from pureed diet/nectar-thick liquid meal tray and with trials of thin liquids via cup and straw. Pt consumed ~6 oz total of water via cup and straw, x3 teaspoons of puree, and x3 cup sips of nectar-thick liquids, without s/sx pharyngeal dysphagia or changes to respiratory status.  Recommend diet upgrade to pureed diet with thin liquids and safe swallowing strategies as outlined below.   SLP to f/u x1 for diet tolerance. Pt may benefit from SLP services at next level of care for ongoing dysphagia management.   Pt and RN made aware of diet recommendations, safe swallowing strategies/aspiration precautions, and SLP POC. Pt nodding in agreement.    HPI HPI: Per admitting H&P'Ory Rehm is a 80 y.o. African-American male with medical history significant for COPD, depression, GERD, dyslipidemia and hypertension, who presented to the ER with acute onset of altered mental status with recent recurrent falls.  He had a subsequent contusion to his right cheek.  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  Most recent chest CT (04/24/2021) revealed an increase in the degree of enhancement in the saccular aneurysm consistent with some worsening mural hematoma. This area now measures 5.9 x 3.6 cm (previously measuring 5.3 x 3.2 cm) the more distal descending thoracic aorta shows some admixture abnormalities although no true dissection flap is seen.      SLP Plan  Goals updated      Recommendations for follow up therapy are one component of a multi-disciplinary discharge planning process, led by the attending physician.  Recommendations may be updated based on patient status, additional functional criteria and insurance authorization.    Recommendations  Diet recommendations: Dysphagia 1 (puree);Thin liquid Liquids provided via: Cup;Straw;Teaspoon Medication Administration: Crushed with puree Supervision: Staff to assist with self feeding;Full supervision/cueing for compensatory strategies Compensations: Minimize environmental distractions;Slow rate;Small sips/bites Postural Changes and/or Swallow Maneuvers: Seated upright 90 degrees;Out of bed for meals                Oral Care Recommendations: Oral care before and after PO Follow Up Recommendations: Skilled nursing-short term rehab (<3 hours/day) Assistance recommended at discharge: Frequent or constant Supervision/Assistance SLP Visit Diagnosis: Dysphagia, oral phase (R13.11) Plan: Goals updated       Cherrie Gauze, M.S., [facility] [phone] [place])                Quintella Baton  05/01/2021, 10:01 AM

## 2021-05-14 ENCOUNTER — Other Ambulatory Visit: Payer: Self-pay

## 2021-05-14 ENCOUNTER — Ambulatory Visit: Payer: Medicare Other | Admitting: Podiatry

## 2021-05-14 ENCOUNTER — Non-Acute Institutional Stay: Payer: Medicare Other | Admitting: Student

## 2021-05-14 DIAGNOSIS — R531 Weakness: Secondary | ICD-10-CM

## 2021-05-14 DIAGNOSIS — S82831D Other fracture of upper and lower end of right fibula, subsequent encounter for closed fracture with routine healing: Secondary | ICD-10-CM

## 2021-05-14 DIAGNOSIS — F0390 Unspecified dementia without behavioral disturbance: Secondary | ICD-10-CM

## 2021-05-14 DIAGNOSIS — Z515 Encounter for palliative care: Secondary | ICD-10-CM

## 2021-05-15 NOTE — Progress Notes (Signed)
[facility] Palliative Care Consult Note Telephone: [phone]  Fax: [phone]   Date of encounter: 05/14/2021  PATIENT NAME: James Dickerson [address] [place] [address]   [phone] (home)  DOB: 1941-02-18 MRN: [id number] PRIMARY CARE PROVIDER:    Dr. Shon Baton PROVIDER:   Vilinda Boehringer, NP  RESPONSIBLE PARTY:    Contact Information     Name Relation Home Work Mobile   carter,barbara Denman George [phone]     [place]burn,Sissy Daughter   [phone]   Narada, Uzzle Daughter [phone]     Lanigan,Bonita Daughter   [phone]   Aidden, Markovic  [phone]         I met face to face with patient and family in the facility. Palliative Care was asked to follow this patient by consultation request of  Dr. Ernst Spell, NP to address advance care planning and complex medical decision making. This is the initial visit.                                     ASSESSMENT AND PLAN / RECOMMENDATIONS:   Advance Care Planning/Goals of Care: Goals include to maximize quality of life and symptom management. Patient/health care surrogate gave his/her permission to discuss.Our advance care planning conversation included a discussion about:    The value and importance of advance care planning  Experiences with loved ones who have been seriously ill or have died  Exploration of personal, cultural or spiritual beliefs that might influence medical decisions  Exploration of goals of care in the event of a sudden injury or illness  CODE STATUS: will clarify.  Edcuation provided on Palliative Medicine vs. Hospice services. Awating return call from family to discuss [place], clarify code status. Patient was chagned to a DNR during recent hospitalization; facility does not have him listed as a DNR. Will also discuss patient's disposition as he is currently receiving skilled services and he is unsure if he will return to group home  where he previously lived.   Symptom Management/Plan:  Thoracic aortic aneurysm-Follow up with cardiothoracic surgery at [facility] [address] as scheduled.   Dementia-continue donepezil as directed. Staff to reorient/redirect as needed. Staff to assist with adl's. Monitor for falls/safety.   Generalized weakness-continue therapy as directed. Staff to assist with adl's.   Right fibula fracture- PT/OT as directed. Conservative management. Weight bearing as tolerated. Pain currently managed. Follow up with orthopedics as scheduled.   Follow up Palliative Care Visit: Palliative care will continue to follow for complex medical decision making, advance care planning, and clarification of goals. Return in 4-6 weeks or prn.  I spent 35 minutes providing this consultation. More than 50% of the time in this consultation was spent in counseling and care coordination.   PPS: 40%  HOSPICE ELIGIBILITY/DIAGNOSIS: TBD  Chief Complaint: Palliative Medicine initial visit.   HISTORY OF PRESENT ILLNESS:  James Dickerson is a 80 y.o. year old male  with COPD, depression, dementia, hypertension, dyslipidemia, GERD. Patient hospitalized 12/1-12/12/2020 due to AMS, s/p falls with right 5th and 6th fracture, T12 and right proximal fibula fracture.  Patient currently as [place]. Nursing staff report patient being stable. He is receiving therapy. He denies having pain, shortness of breath, nausea, constipation. He endorses a fair appetite. Staff is asked to reweigh due to discrepancies. He is sleeping well. No falls since admission.   Patient received resting in  bed. He has pleasant affect; welcomes visit. His voice is low in tone; he states this has been ongoing for 3-4 months. He is able to answer most questions although he has some difficulty with word finding. A 10-point review of systems is negative, except for the pertinent positives and negatives detailed in the HPI.    History obtained from review of  EMR, discussion with primary team, and interview with family, facility staff/caregiver and/or Mr. Gindlesperger.  I reviewed available labs, medications, imaging, studies and related documents from the EMR.  Records reviewed and summarized above.    Physical Exam: Pulse 80, resp 16, b/p 108/70, sats 97% on room air Constitutional: NAD General: frail appearing, thin EYES: anicteric sclera, lids intact, no discharge  ENMT: intact hearing, oral mucous membranes moist, voice low in tone CV: S1S2, RRR, no LE edema Pulmonary: LCTA, no increased work of breathing, no cough, room air Abdomen: normo-active BS + 4 quadrants, soft and non tender, no ascites GU: deferred MSK: sarcopenia, moves all extremities, non-ambulatory Skin: warm and dry, no rashes or wounds on visible skin Neuro:  no generalized weakness, A & O x 2 Psych: non-anxious affect Hem/lymph/immuno: no widespread bruising CURRENT PROBLEM LIST:  Patient Active Problem List   Diagnosis Date Noted   Hip pain    Contusion    Altered mental status 04/24/2021   Protein-calorie malnutrition, severe 07/24/2020   Hematuria 07/23/2020   Lower urinary tract infectious disease 07/22/2020   Foley catheter in place 07/22/2020   GERD (gastroesophageal reflux disease) 07/22/2020   Elevated lactic acid level 07/22/2020   Gross hematuria 07/22/2020   AAA (abdominal aortic aneurysm) 06/23/2020   Status post surgery 06/22/2020   Thoracic aortic aneurysm (TAA) 12/18/2019   Respiratory failure ([place]) 07/11/2017   Acute respiratory failure ([place]) 07/10/2017   CAP (community acquired pneumonia) 07/10/2017   Influenza A 07/10/2017   COPD (chronic obstructive pulmonary disease) ([place]) 07/10/2017   HLD (hyperlipidemia) 03/09/2017   Essential hypertension 08/14/2016   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   Bradycardia 08/05/2016   PAST MEDICAL HISTORY:  Active Ambulatory Problems    Diagnosis Date Noted   Bradycardia 08/05/2016   Acute  respiratory failure ([place]) 07/10/2017   CAP (community acquired pneumonia) 07/10/2017   Influenza A 07/10/2017   COPD (chronic obstructive pulmonary disease) ([place]) 07/10/2017   Respiratory failure ([place]) 07/11/2017   Essential hypertension 08/14/2016   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   HLD (hyperlipidemia) 03/09/2017   Thoracic aortic aneurysm (TAA) 12/18/2019   Status post surgery 06/22/2020   AAA (abdominal aortic aneurysm) 06/23/2020   Lower urinary tract infectious disease 07/22/2020   Foley catheter in place 07/22/2020   GERD (gastroesophageal reflux disease) 07/22/2020   Elevated lactic acid level 07/22/2020   Gross hematuria 07/22/2020   Hematuria 07/23/2020   Protein-calorie malnutrition, severe 07/24/2020   Altered mental status 04/24/2021   Contusion    Hip pain    Resolved Ambulatory Problems    Diagnosis Date Noted   No Resolved Ambulatory Problems   Past Medical History:  Diagnosis Date   Back pain    Cardiomegaly    Cystic kidney disease    Depressive disorder    High cholesterol    History of prostate cancer    Hypertension    Prostate cancer ([place]) 2018   Thyroid nodule    SOCIAL HX:  Social History   Tobacco Use   Smoking status: Every Day    Packs/day:  0.25    Years: 67.00    Pack years: 16.75    Types: Cigarettes   Smokeless tobacco: Never   Tobacco comments:    3 every other day  Substance Use Topics   Alcohol use: No   FAMILY HX: No family history on file.    ALLERGIES: No Known Allergies   PERTINENT MEDICATIONS:  Outpatient Encounter Medications as of 05/14/2021  Medication Sig   acetaminophen (TYLENOL) 325 MG tablet Take 2 tablets (650 mg total) by mouth every 6 (six) hours as needed for mild pain (or Fever >/= 101).   albuterol (PROVENTIL) (2.5 MG/3ML) 0.083% nebulizer solution Take 2.5 mg by nebulization every 6 (six) hours as needed.   amLODipine (NORVASC) 10 MG tablet Take 1 tablet (10 mg total) by mouth daily.    aspirin EC 81 MG EC tablet Take 1 tablet (81 mg total) by mouth daily at 6 (six) AM. Swallow whole.   atorvastatin (LIPITOR) 40 MG tablet Take 1 tablet (40 mg total) by mouth at bedtime.   budesonide-formoterol (SYMBICORT) 160-4.5 MCG/ACT inhaler Inhale 2 puffs into the lungs 2 (two) times daily.   Cholecalciferol (VITAMIN D3) 50 MCG (2000 UT) CHEW Chew 2,000 Units by mouth daily.   donepezil (ARICEPT) 10 MG tablet Take 10 mg by mouth daily.   food thickener (SIMPLYTHICK, NECTAR/LEVEL 2/MILDLY THICK,) GEL Take 1 packet by mouth as needed.   ipratropium-albuterol (DUONEB) 0.5-2.5 (3) MG/3ML SOLN Inhale 3 mLs into the lungs every 6 (six) hours as needed (Wheezing).   megestrol (MEGACE) 40 MG tablet Take 40 mg by mouth daily.   mirabegron ER (MYRBETRIQ) 25 MG TB24 tablet Take 1 tablet (25 mg total) by mouth daily.   Multiple Vitamin (MULTIVITAMIN WITH MINERALS) TABS tablet Take 1 tablet by mouth daily.   Nutritional Supplements (FEEDING SUPPLEMENT, NEPRO CARB STEADY,) LIQD Take 237 mLs by mouth 3 (three) times daily between meals.   OXYGEN Inhale 2-3 L into the lungs as needed (at bedtime).   pantoprazole (PROTONIX) 40 MG tablet Take 40 mg by mouth 2 (two) times daily.   tamsulosin (FLOMAX) 0.4 MG CAPS capsule Take 0.4 mg by mouth daily.   valsartan (DIOVAN) 160 MG tablet Take 160 mg by mouth daily.   No facility-administered encounter medications on file as of 05/14/2021.   Thank you for the opportunity to participate in the care of Mr. Lilley.  The palliative care team will continue to follow. Please call our office at [phone] if we can be of additional assistance.   Ezekiel Slocumb, NP ,   COVID-19 PATIENT SCREENING TOOL Asked and negative response unless otherwise noted:  Have you had symptoms of covid, tested positive or been in contact with someone with symptoms/positive test in the past 5-10 days? No

## 2021-05-16 ENCOUNTER — Telehealth: Payer: Self-pay | Admitting: Student

## 2021-05-16 NOTE — Telephone Encounter (Signed)
Palliative NP spoke with patient's daughter to f/u on recent visit. Daughter Sissy states patient does not have a designated HCPOA. Patient will be discharging to a rest home today. She is unsure of the name, but gave name of director [name] [phone]. She states patient is a DNR. She states she would like for patient to switch to another PCP. She would like for palliative medicine to continue to follow patient. He is to receive [facility] therapy. He is to follow up at due for his TAA. She denies any needs at this time. Will have palliative admin work on obtaining order for palliative to continue at his new residence.

## 2021-05-29 ENCOUNTER — Other Ambulatory Visit: Payer: Self-pay

## 2021-05-29 ENCOUNTER — Encounter: Payer: Self-pay | Admitting: Podiatry

## 2021-05-29 ENCOUNTER — Ambulatory Visit (INDEPENDENT_AMBULATORY_CARE_PROVIDER_SITE_OTHER): Payer: Medicare Other | Admitting: Podiatry

## 2021-05-29 VITALS — Temp 97.1°F | Resp 24

## 2021-05-29 DIAGNOSIS — B351 Tinea unguium: Secondary | ICD-10-CM

## 2021-05-29 DIAGNOSIS — M79675 Pain in left toe(s): Secondary | ICD-10-CM | POA: Diagnosis not present

## 2021-05-29 DIAGNOSIS — M79674 Pain in right toe(s): Secondary | ICD-10-CM

## 2021-05-29 NOTE — Progress Notes (Signed)
°  Subjective:  Patient ID: James Dickerson, male    DOB: [birth year],  MRN: [id number]  Chief Complaint  Patient presents with   Nail Problem    "Clip my toenails."   81 y.o. male returns for the above complaint.  Patient presents with complaint of thickened elongated dystrophic toenails x10.  Mild pain on palpation.  Patient would like to have them debrided down he is not able to do it himself.  He denies any other acute complaints.  Objective:   Vitals:   05/29/21 0950  Resp: (!) 24  Temp: (!) 97.1 F (36.2 C)   Podiatric Exam: Vascular: dorsalis pedis and posterior tibial pulses are palpable bilateral. Capillary return is immediate. Temperature gradient is WNL. Skin turgor WNL  Sensorium: Normal Semmes Weinstein monofilament test. Normal tactile sensation bilaterally. Nail Exam: Pt has thick disfigured discolored nails with subungual debris noted bilateral entire nail hallux through fifth toenails.  Pain on palpation to the nails. Ulcer Exam: There is no evidence of ulcer or pre-ulcerative changes or infection. Orthopedic Exam: Muscle tone and strength are WNL. No limitations in general ROM. No crepitus or effusions noted.  Skin: No Porokeratosis. No infection or ulcers    Assessment & Plan:   1. Pain due to onychomycosis of toenails of both feet     Patient was evaluated and treated and all questions answered.  Onychomycosis with pain  -Nails palliatively debrided as below. -Educated on self-care  Procedure: Nail Debridement Rationale: pain  Type of Debridement: manual, sharp debridement. Instrumentation: Nail nipper, rotary burr. Number of Nails: 10  Procedures and Treatment: Consent by patient was obtained for treatment procedures. The patient understood the discussion of treatment and procedures well. All questions were answered thoroughly reviewed. Debridement of mycotic and hypertrophic toenails, 1 through 5 bilateral and clearing of subungual debris. No ulceration, no  infection noted.  Return Visit-Office Procedure: Patient instructed to return to the office for a follow up visit 3 months for continued evaluation and treatment.  Boneta Lucks, DPM    No follow-ups on file.

## 2021-07-16 ENCOUNTER — Telehealth: Payer: Self-pay

## 2021-07-16 NOTE — Telephone Encounter (Signed)
Call to patient to schedule palliative care visit. No answer and unable to leave message

## 2021-09-04 ENCOUNTER — Ambulatory Visit: Payer: Commercial Managed Care - HMO | Admitting: Podiatry

## 2021-10-07 ENCOUNTER — Other Ambulatory Visit: Payer: Self-pay | Admitting: Thoracic Surgery (Cardiothoracic Vascular Surgery)

## 2021-10-07 DIAGNOSIS — I7122 Aneurysm of the aortic arch, without rupture: Secondary | ICD-10-CM

## 2021-10-07 DIAGNOSIS — I7121 Aneurysm of the ascending aorta, without rupture: Secondary | ICD-10-CM

## 2021-11-14 ENCOUNTER — Inpatient Hospital Stay: Admission: RE | Admit: 2021-11-14 | Payer: Medicare Other | Source: Ambulatory Visit

## 2021-11-14 ENCOUNTER — Ambulatory Visit: Payer: Medicare Other | Admitting: Thoracic Surgery (Cardiothoracic Vascular Surgery)

## 2021-11-17 ENCOUNTER — Encounter: Payer: Self-pay | Admitting: Thoracic Surgery (Cardiothoracic Vascular Surgery)

## 2022-11-23 DEATH — deceased
